# Patient Record
Sex: Female | Born: 1940 | Race: Black or African American | Hispanic: No | Marital: Single | Smoking: Former smoker
Health system: Southern US, Community
[De-identification: ages and names within clinical notes are randomized; demographics above are authoritative.]

## PROBLEM LIST (undated history)

## (undated) DIAGNOSIS — E079 Disorder of thyroid, unspecified: Secondary | ICD-10-CM

## (undated) DIAGNOSIS — R011 Cardiac murmur, unspecified: Secondary | ICD-10-CM

## (undated) DIAGNOSIS — E785 Hyperlipidemia, unspecified: Secondary | ICD-10-CM

## (undated) DIAGNOSIS — I10 Essential (primary) hypertension: Secondary | ICD-10-CM

## (undated) HISTORY — DX: Hyperlipidemia, unspecified: E78.5

## (undated) HISTORY — DX: Cardiac murmur, unspecified: R01.1

## (undated) HISTORY — DX: Essential (primary) hypertension: I10

## (undated) HISTORY — DX: Disorder of thyroid, unspecified: E07.9

## (undated) HISTORY — PX: TUBAL LIGATION: SHX77

---

## 1976-10-31 HISTORY — PX: APPENDECTOMY: SHX54

## 1976-10-31 HISTORY — PX: ABDOMINAL HYSTERECTOMY: SHX81

## 2004-11-04 ENCOUNTER — Ambulatory Visit: Payer: Self-pay | Admitting: Family Medicine

## 2004-11-17 ENCOUNTER — Ambulatory Visit: Payer: Self-pay | Admitting: Family Medicine

## 2005-05-31 ENCOUNTER — Ambulatory Visit: Payer: Self-pay | Admitting: Family Medicine

## 2006-01-03 ENCOUNTER — Ambulatory Visit: Payer: Self-pay | Admitting: Family Medicine

## 2007-01-09 ENCOUNTER — Ambulatory Visit: Payer: Self-pay | Admitting: Family Medicine

## 2007-12-24 ENCOUNTER — Ambulatory Visit: Payer: Self-pay | Admitting: Family Medicine

## 2008-01-31 ENCOUNTER — Ambulatory Visit: Payer: Self-pay | Admitting: Family Medicine

## 2008-03-20 ENCOUNTER — Ambulatory Visit: Payer: Self-pay | Admitting: Gastroenterology

## 2008-03-27 ENCOUNTER — Ambulatory Visit: Payer: Self-pay | Admitting: Family Medicine

## 2008-12-21 LAB — HM COLONOSCOPY

## 2009-02-17 ENCOUNTER — Ambulatory Visit: Payer: Self-pay | Admitting: Family Medicine

## 2010-03-29 ENCOUNTER — Ambulatory Visit: Payer: Self-pay | Admitting: Family Medicine

## 2011-04-08 ENCOUNTER — Ambulatory Visit: Payer: Self-pay | Admitting: Family Medicine

## 2012-05-29 ENCOUNTER — Ambulatory Visit: Payer: Self-pay | Admitting: Family Medicine

## 2012-05-30 ENCOUNTER — Ambulatory Visit: Payer: Self-pay | Admitting: Family Medicine

## 2012-12-10 ENCOUNTER — Ambulatory Visit: Payer: Self-pay | Admitting: Family Medicine

## 2012-12-17 LAB — HM MAMMOGRAPHY

## 2012-12-21 ENCOUNTER — Ambulatory Visit (INDEPENDENT_AMBULATORY_CARE_PROVIDER_SITE_OTHER): Payer: Medicare Other | Admitting: Internal Medicine

## 2012-12-21 ENCOUNTER — Encounter: Payer: Self-pay | Admitting: Internal Medicine

## 2012-12-21 VITALS — BP 140/84 | HR 77 | Temp 98.3°F | Ht 62.16 in | Wt 144.0 lb

## 2012-12-21 DIAGNOSIS — E079 Disorder of thyroid, unspecified: Secondary | ICD-10-CM

## 2012-12-21 DIAGNOSIS — L989 Disorder of the skin and subcutaneous tissue, unspecified: Secondary | ICD-10-CM

## 2012-12-21 DIAGNOSIS — I10 Essential (primary) hypertension: Secondary | ICD-10-CM

## 2012-12-30 ENCOUNTER — Encounter: Payer: Self-pay | Admitting: Internal Medicine

## 2012-12-30 NOTE — Assessment & Plan Note (Signed)
Blood pressure is elevated today.  She states it is usually well controlled.  Have her spot check her pressures.  Get her back in soon to reassess.  Same med regimen.  Check metabolic panel

## 2012-12-30 NOTE — Progress Notes (Signed)
Subjective:    Patient ID: Christina Cole, female    DOB: Dec 26, 1940, 72 y.o.   MRN: [id number]  HPI 72 year old female with past history of hypertension and hypercholesterolemia who comes in today to follow up on these issues as well as to establish care.  Also here for her physical exam.  Former pt of Dr Thana Ates.  States she feels good.  No chest pain or tightness with increased activity or exertion.  Breathing stable.  Has a left neck mole - present x 2 years.  No nausea or vomiting.  No bowel change.  Knees get a little stiff at times, but better when she is active.  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   Past Medical History  Diagnosis Date  . Hypertension   . Hyperlipidemia   . Thyroid disease   . Heart murmur     Outpatient Encounter Prescriptions as of 12/21/2012  Medication Sig Dispense Refill  . atorvastatin (LIPITOR) 40 MG tablet Take 40 mg by mouth daily.      Marland Kitchen diltiazem (CARDIZEM LA) 360 MG 24 hr tablet Take 360 mg by mouth daily.      Marland Kitchen EPINEPHrine (EPIPEN JR) 0.15 MG/0.3ML injection Inject 0.15 mg into the muscle as needed for anaphylaxis.      Marland Kitchen losartan-hydrochlorothiazide (HYZAAR) 100-25 MG per tablet Take 1 tablet by mouth daily.      . Polyvinyl Alcohol-Povidone (REFRESH OP) Apply to eye.      . potassium chloride SA (K-DUR,KLOR-CON) 20 MEQ tablet Take 20 mEq by mouth 2 (two) times daily.       No facility-administered encounter medications on file as of 12/21/2012.    Review of Systems Patient denies any headache, lightheadedness or dizziness.  No sinus or allergy symptoms.  No chest pain, tightness or palpitations.  No increased shortness of breath, cough or congestion.  No nausea or vomiting.  No acid reflux.  No abdominal pain or cramping.  No bowel change, such as diarrhea, constipation, BRBPR or melana.  No urine change.        Objective:   Physical Exam Filed Vitals:   12/21/12 1451  BP: 140/84  Pulse: 77  Temp: 98.3 F (31.13 C)   72 year old  female in no acute distress.   HEENT:  Nares- clear.  Oropharynx - without lesions. NECK:  Supple.  Nontender.  No audible bruit.  HEART:  Appears to be regular. LUNGS:  No crackles or wheezing audible.  Respirations even and unlabored.  RADIAL PULSE:  Equal bilaterally.    BREASTS:  No nipple discharge or nipple retraction present.  Could not appreciate any distinct nodules or axillary adenopathy.  ABDOMEN:  Soft, nontender.  Bowel sounds present and normal.  No audible abdominal bruit.  GU:  Normal external genitalia.  Vaginal vault without lesions.  S/p hysterectomy.  Could not appreciate any adnexal masses or tenderness.   RECTAL:  Heme negative.   EXTREMITIES:  No increased edema present.  DP pulses palpable and equal bilaterally.   SKIN:  Mole left neck.         Assessment & Plan:  NECK MOLE.  Will refer to dermatology for evaluation and removal.   MSK.  Knees stiff at times.  Better with activity.  Stretches.  Stay active.   ABNORMAL MAMMOGRAM.  I do not have results, but according to pt - she had her mammogram 7/13.  Recommended follow up views/ultrasound.  These were performed 1/14.  Due follow up bilateral in 7/14.    HEALTH MAINTENANCE.  Physical today.  She is s/p hysterectomy and does not require yearly pap smears.  Mammogram as outlined.  Obtain records.  Reports had colonoscopy 2010.

## 2012-12-30 NOTE — Assessment & Plan Note (Signed)
Unclear.  Need to obtain records.  Apparently has seen Dr Kerrie Pleasure.

## 2012-12-30 NOTE — Assessment & Plan Note (Signed)
Low cholesterol diet and exercise.  Continues on lipitor.  Check lipid panel and liver function.

## 2012-12-30 NOTE — Assessment & Plan Note (Signed)
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

## 2013-02-05 ENCOUNTER — Telehealth: Payer: Self-pay | Admitting: *Deleted

## 2013-02-05 ENCOUNTER — Encounter: Payer: Self-pay | Admitting: Internal Medicine

## 2013-02-05 ENCOUNTER — Ambulatory Visit (INDEPENDENT_AMBULATORY_CARE_PROVIDER_SITE_OTHER): Payer: Medicare Other | Admitting: Internal Medicine

## 2013-02-05 VITALS — BP 130/80 | HR 71 | Temp 98.5°F | Ht 62.16 in | Wt 144.2 lb

## 2013-02-05 DIAGNOSIS — E7800 Pure hypercholesterolemia, unspecified: Secondary | ICD-10-CM

## 2013-02-05 DIAGNOSIS — I10 Essential (primary) hypertension: Secondary | ICD-10-CM

## 2013-02-05 DIAGNOSIS — Z9109 Other allergy status, other than to drugs and biological substances: Secondary | ICD-10-CM

## 2013-02-05 DIAGNOSIS — E079 Disorder of thyroid, unspecified: Secondary | ICD-10-CM

## 2013-02-05 LAB — HEPATIC FUNCTION PANEL
Alkaline Phosphatase: 55 U/L (ref 39–117)
Bilirubin, Direct: 0.1 mg/dL (ref 0.0–0.3)
Total Bilirubin: 1.1 mg/dL (ref 0.3–1.2)

## 2013-02-05 LAB — BASIC METABOLIC PANEL
CO2: 31 mEq/L (ref 19–32)
Chloride: 101 mEq/L (ref 96–112)
Creatinine, Ser: 0.8 mg/dL (ref 0.4–1.2)
Glucose, Bld: 86 mg/dL (ref 70–99)
Sodium: 140 mEq/L (ref 135–145)

## 2013-02-05 LAB — CBC WITH DIFFERENTIAL/PLATELET
Basophils Absolute: 0 10*3/uL (ref 0.0–0.1)
Basophils Relative: 0.7 % (ref 0.0–3.0)
Eosinophils Absolute: 0.1 10*3/uL (ref 0.0–0.7)
Hemoglobin: 11.5 g/dL — ABNORMAL LOW (ref 12.0–15.0)
MCHC: 33.1 g/dL (ref 30.0–36.0)
MCV: 85 fl (ref 78.0–100.0)
Monocytes Absolute: 0.3 10*3/uL (ref 0.1–1.0)
Neutro Abs: 1.9 10*3/uL (ref 1.4–7.7)
RBC: 4.09 Mil/uL (ref 3.87–5.11)
RDW: 13.7 % (ref 11.5–14.6)

## 2013-02-05 LAB — LIPID PANEL
LDL Cholesterol: 83 mg/dL (ref 0–99)
Total CHOL/HDL Ratio: 3

## 2013-02-06 ENCOUNTER — Telehealth: Payer: Self-pay | Admitting: Internal Medicine

## 2013-02-06 DIAGNOSIS — D649 Anemia, unspecified: Secondary | ICD-10-CM

## 2013-02-06 NOTE — Telephone Encounter (Signed)
Pt notified of lab results and need for follow up lab in 2 weeks.  She wants to come in on 02/20/13 at 8:30 for labs.  Please put on lab appt.  Pt aware of appt time.

## 2013-02-07 NOTE — Telephone Encounter (Signed)
Appointment made

## 2013-02-10 ENCOUNTER — Encounter: Payer: Self-pay | Admitting: Internal Medicine

## 2013-02-10 NOTE — Assessment & Plan Note (Signed)
Unclear.  Need to obtain records.  Apparently has seen Dr Kerrie Pleasure.

## 2013-02-10 NOTE — Assessment & Plan Note (Signed)
Low cholesterol diet and exercise.  On lipitor. Check lipid panel and liver function.

## 2013-02-10 NOTE — Assessment & Plan Note (Signed)
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

## 2013-02-10 NOTE — Assessment & Plan Note (Signed)
Blood pressure better today.  Pressures as outlined on outside checks.   Same medication regimen.  Check metabolic panel

## 2013-02-10 NOTE — Progress Notes (Signed)
  Subjective:    Patient ID: Christina Cole, female    DOB: Mar 01, 1941, 72 y.o.   MRN: [id number]  HPI 72 year old female with past history of hypertension and hypercholesterolemia who comes in today for a scheduled follow up.   States she feels good.  No chest pain or tightness with increased activity or exertion.  Breathing stable.  Had a left neck mole - present x 2 years.  Saw dermatology.  Had removed.  No nausea or vomiting.  No bowel change.  Blood pressure recently averaging 128-142/70-84.  Overall she feels she is doing well.   Past Medical History  Diagnosis Date  . Hypertension   . Hyperlipidemia   . Thyroid disease   . Heart murmur     Outpatient Encounter Prescriptions as of 02/05/2013  Medication Sig Dispense Refill  . atorvastatin (LIPITOR) 40 MG tablet Take 40 mg by mouth daily.      Marland Kitchen diltiazem (CARDIZEM LA) 360 MG 24 hr tablet Take 360 mg by mouth daily.      Marland Kitchen EPINEPHrine (EPIPEN JR) 0.15 MG/0.3ML injection Inject 0.15 mg into the muscle as needed for anaphylaxis.      Marland Kitchen losartan-hydrochlorothiazide (HYZAAR) 100-25 MG per tablet Take 1 tablet by mouth daily.      . Polyvinyl Alcohol-Povidone (REFRESH OP) Apply to eye.      . potassium chloride SA (K-DUR,KLOR-CON) 20 MEQ tablet Take 20 mEq by mouth 2 (two) times daily.       No facility-administered encounter medications on file as of 02/05/2013.    Review of Systems Patient denies any headache, lightheadedness or dizziness.  No sinus or allergy symptoms.  No chest pain, tightness or palpitations.  No increased shortness of breath, cough or congestion.  No nausea or vomiting.  No acid reflux.  No abdominal pain or cramping.  No bowel change, such as diarrhea, constipation, BRBPR or melana.  No urine change.        Objective:   Physical Exam  Filed Vitals:   02/05/13 1133  BP: 130/80  Pulse: 71  Temp: 98.5 F (37.72 C)   72 year old female in no acute distress.   HEENT:  Nares- clear.  Oropharynx - without  lesions. NECK:  Supple.  Nontender.  No audible bruit.  HEART:  Appears to be regular. LUNGS:  No crackles or wheezing audible.  Respirations even and unlabored.  RADIAL PULSE:  Equal bilaterally.    ABDOMEN:  Soft, nontender.  Bowel sounds present and normal.  No audible abdominal bruit.    EXTREMITIES:  No increased edema present.  DP pulses palpable and equal bilaterally.   SKIN:  S/p excision of mole left neck.         Assessment & Plan:  NECK MOLE.  Saw dermatology.  Removed.   ABNORMAL MAMMOGRAM.  I do not have results, but according to pt - she had her mammogram 7/13.  Recommended follow up views/ultrasound.  These were performed 1/14.  Due follow up bilateral in 7/14.  Make sure scheduled.   HEALTH MAINTENANCE.  Physical 12/21/12.  .  She is s/p hysterectomy and does not require yearly pap smears.  Mammogram as outlined.  Obtain records.  Reports had colonoscopy 2010.

## 2013-02-18 ENCOUNTER — Other Ambulatory Visit: Payer: Self-pay | Admitting: Internal Medicine

## 2013-02-18 NOTE — Telephone Encounter (Signed)
Rx sent to pharmacy by escript  

## 2013-02-20 ENCOUNTER — Other Ambulatory Visit: Payer: Self-pay | Admitting: Internal Medicine

## 2013-02-20 ENCOUNTER — Other Ambulatory Visit (INDEPENDENT_AMBULATORY_CARE_PROVIDER_SITE_OTHER): Payer: Medicare Other

## 2013-02-20 DIAGNOSIS — D649 Anemia, unspecified: Secondary | ICD-10-CM

## 2013-02-20 LAB — CBC WITH DIFFERENTIAL/PLATELET
Eosinophils Absolute: 0.1 10*3/uL (ref 0.0–0.7)
Eosinophils Relative: 2.7 % (ref 0.0–5.0)
Lymphocytes Relative: 45.9 % (ref 12.0–46.0)
MCV: 84.6 fl (ref 78.0–100.0)
Monocytes Absolute: 0.3 10*3/uL (ref 0.1–1.0)
Neutrophils Relative %: 45.1 % (ref 43.0–77.0)
Platelets: 218 10*3/uL (ref 150.0–400.0)
RBC: 4 Mil/uL (ref 3.87–5.11)
WBC: 5.3 10*3/uL (ref 4.5–10.5)

## 2013-02-20 LAB — IBC PANEL
Iron: 56 ug/dL (ref 42–145)
Saturation Ratios: 17.8 % — ABNORMAL LOW (ref 20.0–50.0)

## 2013-02-20 LAB — VITAMIN B12: Vitamin B-12: 318 pg/mL (ref 211–911)

## 2013-02-20 LAB — FERRITIN: Ferritin: 100.6 ng/mL (ref 10.0–291.0)

## 2013-02-20 NOTE — Progress Notes (Signed)
Orders placed for follow up cbc

## 2013-02-26 NOTE — Telephone Encounter (Signed)
error 

## 2013-04-03 ENCOUNTER — Other Ambulatory Visit: Payer: Self-pay | Admitting: Internal Medicine

## 2013-04-23 ENCOUNTER — Other Ambulatory Visit (INDEPENDENT_AMBULATORY_CARE_PROVIDER_SITE_OTHER): Payer: Medicare Other

## 2013-04-23 DIAGNOSIS — D649 Anemia, unspecified: Secondary | ICD-10-CM

## 2013-04-23 LAB — CBC WITH DIFFERENTIAL/PLATELET
Basophils Absolute: 0 10*3/uL (ref 0.0–0.1)
Eosinophils Absolute: 0.2 10*3/uL (ref 0.0–0.7)
Lymphocytes Relative: 50.6 % — ABNORMAL HIGH (ref 12.0–46.0)
MCHC: 33.5 g/dL (ref 30.0–36.0)
MCV: 86.7 fl (ref 78.0–100.0)
Monocytes Absolute: 0.3 10*3/uL (ref 0.1–1.0)
Neutrophils Relative %: 38.3 % — ABNORMAL LOW (ref 43.0–77.0)
Platelets: 212 10*3/uL (ref 150.0–400.0)
RBC: 3.95 Mil/uL (ref 3.87–5.11)
RDW: 14.9 % — ABNORMAL HIGH (ref 11.5–14.6)

## 2013-04-25 ENCOUNTER — Other Ambulatory Visit: Payer: Self-pay | Admitting: Internal Medicine

## 2013-04-25 DIAGNOSIS — D649 Anemia, unspecified: Secondary | ICD-10-CM

## 2013-04-25 DIAGNOSIS — E7800 Pure hypercholesterolemia, unspecified: Secondary | ICD-10-CM

## 2013-04-25 DIAGNOSIS — I10 Essential (primary) hypertension: Secondary | ICD-10-CM

## 2013-04-25 NOTE — Progress Notes (Signed)
Order placed for f/u labs.  

## 2013-05-08 ENCOUNTER — Other Ambulatory Visit: Payer: Medicare Other

## 2013-05-08 ENCOUNTER — Encounter: Payer: Self-pay | Admitting: *Deleted

## 2013-05-08 LAB — FECAL OCCULT BLOOD, IMMUNOCHEMICAL: Fecal Occult Bld: NEGATIVE

## 2013-06-06 ENCOUNTER — Other Ambulatory Visit (INDEPENDENT_AMBULATORY_CARE_PROVIDER_SITE_OTHER): Payer: Medicare Other

## 2013-06-06 ENCOUNTER — Other Ambulatory Visit: Payer: Medicare Other

## 2013-06-06 DIAGNOSIS — E7800 Pure hypercholesterolemia, unspecified: Secondary | ICD-10-CM

## 2013-06-06 DIAGNOSIS — I10 Essential (primary) hypertension: Secondary | ICD-10-CM

## 2013-06-06 DIAGNOSIS — D649 Anemia, unspecified: Secondary | ICD-10-CM

## 2013-06-06 LAB — HEPATIC FUNCTION PANEL
AST: 17 U/L (ref 0–37)
Albumin: 3.8 g/dL (ref 3.5–5.2)
Alkaline Phosphatase: 56 U/L (ref 39–117)
Total Bilirubin: 1.1 mg/dL (ref 0.3–1.2)

## 2013-06-06 LAB — CBC WITH DIFFERENTIAL/PLATELET
Basophils Relative: 0.6 % (ref 0.0–3.0)
Eosinophils Absolute: 0.2 10*3/uL (ref 0.0–0.7)
Eosinophils Relative: 3.5 % (ref 0.0–5.0)
Lymphocytes Relative: 50.9 % — ABNORMAL HIGH (ref 12.0–46.0)
MCHC: 33.1 g/dL (ref 30.0–36.0)
MCV: 86.7 fl (ref 78.0–100.0)
Monocytes Absolute: 0.3 10*3/uL (ref 0.1–1.0)
Neutrophils Relative %: 38.5 % — ABNORMAL LOW (ref 43.0–77.0)
Platelets: 217 10*3/uL (ref 150.0–400.0)
RBC: 3.92 Mil/uL (ref 3.87–5.11)
WBC: 5 10*3/uL (ref 4.5–10.5)

## 2013-06-06 LAB — BASIC METABOLIC PANEL
Calcium: 9.4 mg/dL (ref 8.4–10.5)
GFR: 96.74 mL/min (ref >60.00)
Potassium: 3.2 mEq/L — ABNORMAL LOW (ref 3.5–5.1)
Sodium: 141 mEq/L (ref 135–145)

## 2013-06-06 LAB — LIPID PANEL
HDL: 46.4 mg/dL (ref >39.00)
LDL Cholesterol: 86 mg/dL (ref 0–99)
VLDL: 25.2 mg/dL (ref 0.0–40.0)

## 2013-06-10 ENCOUNTER — Ambulatory Visit (INDEPENDENT_AMBULATORY_CARE_PROVIDER_SITE_OTHER): Payer: Medicare Other | Admitting: Internal Medicine

## 2013-06-10 ENCOUNTER — Ambulatory Visit: Payer: Medicare Other | Admitting: Internal Medicine

## 2013-06-10 ENCOUNTER — Encounter: Payer: Self-pay | Admitting: Internal Medicine

## 2013-06-10 VITALS — BP 128/90 | HR 64 | Temp 98.1°F | Ht 62.16 in | Wt 150.0 lb

## 2013-06-10 DIAGNOSIS — Z9109 Other allergy status, other than to drugs and biological substances: Secondary | ICD-10-CM

## 2013-06-10 DIAGNOSIS — I10 Essential (primary) hypertension: Secondary | ICD-10-CM

## 2013-06-10 DIAGNOSIS — D649 Anemia, unspecified: Secondary | ICD-10-CM

## 2013-06-10 DIAGNOSIS — E7800 Pure hypercholesterolemia, unspecified: Secondary | ICD-10-CM

## 2013-06-10 DIAGNOSIS — E876 Hypokalemia: Secondary | ICD-10-CM

## 2013-06-10 DIAGNOSIS — E079 Disorder of thyroid, unspecified: Secondary | ICD-10-CM

## 2013-06-10 MED ORDER — POTASSIUM CHLORIDE CRYS ER 20 MEQ PO TBCR: 20.0000 meq | EXTENDED_RELEASE_TABLET | Freq: Two times a day (BID) | ORAL | Status: DC

## 2013-06-10 NOTE — Assessment & Plan Note (Addendum)
Followed by Dr Kerrie Pleasure.   Had thyroid ultrasound 3/14 - per record - multinodular thyroid.  Follow thyroid function.

## 2013-06-10 NOTE — Assessment & Plan Note (Addendum)
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  Used recently.  No adverse reactions.  Follow.

## 2013-06-10 NOTE — Assessment & Plan Note (Addendum)
Low cholesterol diet and exercise.  On lipitor.  Follow lipid panel and liver function.  Recent cholesterol panel revealed total cholesterol 158, triglycerides 126, HDL 46 and LDL 86.

## 2013-06-10 NOTE — Assessment & Plan Note (Addendum)
Blood pressure as outlined.  Same medication regimen.  Check metabolic panel.  Have her spot check her pressures.

## 2013-06-10 NOTE — Progress Notes (Signed)
  Subjective:    Patient ID: Christina Cole, female    DOB: 1941-09-13, 72 y.o.   MRN: [id number]  HPI 72 year old female with past history of hypertension and hypercholesterolemia who comes in today for a scheduled follow up.   States she feels good.  No chest pain or tightness with increased activity or exertion.  Breathing stable.  No nausea or vomiting.  No bowel change.  Overall she feels she is doing well.  States she was stung by a wasp x 2 05/15/13.  Is allergic.  Used an epi pen.  States did not have severe symptoms.  Dong well.  Overalls he feels good.     Past Medical History  Diagnosis Date  . Hypertension   . Hyperlipidemia   . Thyroid disease   . Heart murmur     Outpatient Encounter Prescriptions as of 06/10/2013  Medication Sig Dispense Refill  . atorvastatin (LIPITOR) 40 MG tablet TAKE 1 TABLET BY MOUTH ONCE A DAY  30 tablet  5  . EPINEPHrine (EPIPEN JR) 0.15 MG/0.3ML injection Inject 0.15 mg into the muscle as needed for anaphylaxis.      Marland Kitchen losartan-hydrochlorothiazide (HYZAAR) 100-25 MG per tablet Take 1 tablet by mouth daily.      Marland Kitchen MATZIM LA 360 MG 24 hr tablet TAKE 1 TABLET BY MOUTH EVERY DAY  30 tablet  5  . Polyvinyl Alcohol-Povidone (REFRESH OP) Apply to eye.      . potassium chloride SA (K-DUR,KLOR-CON) 20 MEQ tablet Take 20 mEq by mouth 2 (two) times daily.       No facility-administered encounter medications on file as of 06/10/2013.    Review of Systems Patient denies any headache, lightheadedness or dizziness.  No sinus or allergy symptoms.  No chest pain, tightness or palpitations.  No increased shortness of breath, cough or congestion.  No nausea or vomiting.  No acid reflux.  No abdominal pain or cramping.  No bowel change, such as diarrhea, constipation, BRBPR or melana.  No urine change.        Objective:   Physical Exam  Filed Vitals:   06/10/13 1019  BP: 128/90  Pulse: 64  Temp: 98.1 F (36.7 C)   Blood pressure recheck 134-136/78-80  72 year  old female in no acute distress.   HEENT:  Nares- clear.  Oropharynx - without lesions. NECK:  Supple.  Nontender.  No audible bruit.  HEART:  Appears to be regular. LUNGS:  No crackles or wheezing audible.  Respirations even and unlabored.  RADIAL PULSE:  Equal bilaterally.    ABDOMEN:  Soft, nontender.  Bowel sounds present and normal.  No audible abdominal bruit.    EXTREMITIES:  No increased edema present.  DP pulses palpable and equal bilaterally.         Assessment & Plan:  ABNORMAL MAMMOGRAM.  I do not have results, but according to pt - she had her mammogram 7/13.  Recommended follow up views/ultrasound.  These were performed 1/14.  Was due follow up bilateral in 7/14.  Need results  HEALTH MAINTENANCE.  Physical 12/21/12.  .  She is s/p hysterectomy and does not require yearly pap smears.  Mammogram as outlined.   Reports had colonoscopy 2010.  With the anemia, will refer back to confirm no further GI w/up warranted.

## 2013-06-12 ENCOUNTER — Telehealth: Payer: Self-pay | Admitting: Internal Medicine

## 2013-06-12 ENCOUNTER — Other Ambulatory Visit: Payer: Self-pay | Admitting: Internal Medicine

## 2013-06-12 ENCOUNTER — Encounter: Payer: Self-pay | Admitting: Internal Medicine

## 2013-06-12 DIAGNOSIS — Z1239 Encounter for other screening for malignant neoplasm of breast: Secondary | ICD-10-CM

## 2013-06-12 DIAGNOSIS — E876 Hypokalemia: Secondary | ICD-10-CM | POA: Insufficient documentation

## 2013-06-12 DIAGNOSIS — D649 Anemia, unspecified: Secondary | ICD-10-CM | POA: Insufficient documentation

## 2013-06-12 NOTE — Assessment & Plan Note (Signed)
On potassium supplement.  Potassium decreased.  States has been taking regularly.  Information given on foods with increased potassium.  Follow.  Recheck soon to confirm normalized.

## 2013-06-12 NOTE — Progress Notes (Signed)
Order placed for bilateral mammo

## 2013-06-12 NOTE — Telephone Encounter (Signed)
Order placed fo f/u bilateral mammogram.

## 2013-06-12 NOTE — Assessment & Plan Note (Signed)
Hemoglobin 11.3.  Ferritin ok.  Will check B12.  Given persistent decrease and given last scope 2010, will refer to Dr Servando Snare to confirm if further GI w/up warranted.

## 2013-06-17 ENCOUNTER — Other Ambulatory Visit: Payer: Self-pay | Admitting: Urgent Care

## 2013-06-17 LAB — CBC WITH DIFFERENTIAL/PLATELET
Basophil #: 0 10*3/uL (ref 0.0–0.1)
Basophil %: 0.9 %
Eosinophil #: 0.1 10*3/uL (ref 0.0–0.7)
Eosinophil %: 2.5 %
HCT: 35.5 % (ref 35.0–47.0)
Lymphocyte #: 2.3 10*3/uL (ref 1.0–3.6)
Lymphocyte %: 43.2 %
MCH: 28.7 pg (ref 26.0–34.0)
MCV: 84 fL (ref 80–100)
Monocyte %: 8.6 %
Neutrophil %: 44.8 %
RBC: 4.24 10*6/uL (ref 3.80–5.20)

## 2013-06-17 LAB — FERRITIN: Ferritin (ARMC): 101 ng/mL (ref 8–388)

## 2013-06-17 LAB — IRON: Iron: 61 ug/dL (ref 50–170)

## 2013-07-03 ENCOUNTER — Encounter: Payer: Self-pay | Admitting: Emergency Medicine

## 2013-07-09 ENCOUNTER — Ambulatory Visit: Payer: Self-pay | Admitting: Internal Medicine

## 2013-08-18 ENCOUNTER — Other Ambulatory Visit: Payer: Self-pay | Admitting: Internal Medicine

## 2013-09-13 ENCOUNTER — Encounter: Payer: Self-pay | Admitting: Internal Medicine

## 2013-09-18 ENCOUNTER — Ambulatory Visit (INDEPENDENT_AMBULATORY_CARE_PROVIDER_SITE_OTHER): Payer: Medicare Other | Admitting: Internal Medicine

## 2013-09-18 ENCOUNTER — Encounter: Payer: Self-pay | Admitting: Internal Medicine

## 2013-09-18 VITALS — BP 130/90 | HR 62 | Temp 98.2°F | Ht 62.16 in | Wt 143.8 lb

## 2013-09-18 DIAGNOSIS — D649 Anemia, unspecified: Secondary | ICD-10-CM

## 2013-09-18 DIAGNOSIS — E7800 Pure hypercholesterolemia, unspecified: Secondary | ICD-10-CM

## 2013-09-18 DIAGNOSIS — E079 Disorder of thyroid, unspecified: Secondary | ICD-10-CM

## 2013-09-18 DIAGNOSIS — Z9109 Other allergy status, other than to drugs and biological substances: Secondary | ICD-10-CM

## 2013-09-18 DIAGNOSIS — E876 Hypokalemia: Secondary | ICD-10-CM

## 2013-09-18 DIAGNOSIS — I10 Essential (primary) hypertension: Secondary | ICD-10-CM

## 2013-09-18 MED ORDER — LOSARTAN POTASSIUM-HCTZ 100-25 MG PO TABS: 1.0000 | ORAL_TABLET | Freq: Every day | ORAL | Status: DC

## 2013-09-18 NOTE — Progress Notes (Signed)
  Subjective:    Patient ID: Christina Cole, female    DOB: 1941/09/27, 72 y.o.   MRN: [id number]  HPI 72 year old female with past history of hypertension and hypercholesterolemia who comes in today for a scheduled follow up.   States she feels good.  No chest pain or tightness with increased activity or exertion.  Breathing stable.  No nausea or vomiting.  No bowel change.  Overall she feels she is doing well.   Overalls she feels good.     Past Medical History  Diagnosis Date  . Hypertension   . Hyperlipidemia   . Thyroid disease   . Heart murmur     Outpatient Encounter Prescriptions as of 09/18/2013  Medication Sig  . atorvastatin (LIPITOR) 40 MG tablet TAKE 1 TABLET BY MOUTH ONCE A DAY  . EPINEPHrine (EPIPEN JR) 0.15 MG/0.3ML injection Inject 0.15 mg into the muscle as needed for anaphylaxis.  Marland Kitchen losartan-hydrochlorothiazide (HYZAAR) 100-25 MG per tablet Take 1 tablet by mouth daily.  Marland Kitchen MATZIM LA 360 MG 24 hr tablet TAKE 1 TABLET BY MOUTH EVERY DAY  . Polyvinyl Alcohol-Povidone (REFRESH OP) Apply to eye.  . potassium chloride SA (K-DUR,KLOR-CON) 20 MEQ tablet Take 1 tablet (20 mEq total) by mouth 2 (two) times daily.    Review of Systems Patient denies any headache, lightheadedness or dizziness.  No sinus or allergy symptoms.  No chest pain, tightness or palpitations.  No increased shortness of breath, cough or congestion.  No nausea or vomiting.  No acid reflux.  No abdominal pain or cramping.  No bowel change, such as diarrhea, constipation, BRBPR or melana.  No urine change.        Objective:   Physical Exam  Filed Vitals:   09/18/13 1051  BP: 130/90  Pulse: 62  Temp: 98.2 F (36.8 C)   Blood pressure recheck 134-32/61-37  72 year old female in no acute distress.   HEENT:  Nares- clear.  Oropharynx - without lesions. NECK:  Supple.  Nontender.  No audible bruit.  HEART:  Appears to be regular. LUNGS:  No crackles or wheezing audible.  Respirations even and unlabored.   RADIAL PULSE:  Equal bilaterally.    ABDOMEN:  Soft, nontender.  Bowel sounds present and normal.  No audible abdominal bruit.    EXTREMITIES:  No increased edema present.  DP pulses palpable and equal bilaterally.         Assessment & Plan:  ABNORMAL MAMMOGRAM.  I do not have results, but according to pt - she had her mammogram 7/13.  Recommended follow up views/ultrasound.  These were performed 1/14.  Had mammogram (bilateral ) 07/09/13 - negative.   HEALTH MAINTENANCE.  Physical 12/21/12.  .  She is s/p hysterectomy and does not require yearly pap smears.  Mammogram as outlined.   Reports had colonoscopy 2010.   Mammogram 07/09/13 - negative.

## 2013-09-18 NOTE — Progress Notes (Signed)
Pre-visit discussion using our clinic review tool. No additional management support is needed unless otherwise documented below in the visit note.  

## 2013-09-22 ENCOUNTER — Encounter: Payer: Self-pay | Admitting: Internal Medicine

## 2013-09-22 NOTE — Assessment & Plan Note (Signed)
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  Used recently.  No adverse reactions.  Follow.

## 2013-09-22 NOTE — Assessment & Plan Note (Signed)
On potassium supplements.  Follow potassium.   

## 2013-09-22 NOTE — Assessment & Plan Note (Signed)
Blood pressure as outlined.  Same medication regimen.  Check metabolic panel.  Have her spot check her pressures.

## 2013-09-22 NOTE — Assessment & Plan Note (Signed)
Low cholesterol diet and exercise.  On lipitor.  Follow lipid panel and liver function.  Last cholesterol panel revealed total cholesterol 158, triglycerides 126, HDL 46 and LDL 86.

## 2013-09-22 NOTE — Assessment & Plan Note (Signed)
Hemoglobin 11.3.  Ferritin ok.  Will check B12.  Given persistent decrease and given last scope 2010, was seen at Dr Annabell Sabal office.  Felt no further w/up warranted.

## 2013-09-22 NOTE — Assessment & Plan Note (Signed)
Followed by Dr Kerrie Pleasure.   Had thyroid ultrasound 3/14 - per record - multinodular thyroid.  Follow thyroid function.

## 2013-09-23 ENCOUNTER — Other Ambulatory Visit (INDEPENDENT_AMBULATORY_CARE_PROVIDER_SITE_OTHER): Payer: Medicare Other

## 2013-09-23 DIAGNOSIS — D649 Anemia, unspecified: Secondary | ICD-10-CM

## 2013-09-23 DIAGNOSIS — E7800 Pure hypercholesterolemia, unspecified: Secondary | ICD-10-CM

## 2013-09-23 DIAGNOSIS — I10 Essential (primary) hypertension: Secondary | ICD-10-CM

## 2013-09-23 DIAGNOSIS — E876 Hypokalemia: Secondary | ICD-10-CM

## 2013-09-23 LAB — HEPATIC FUNCTION PANEL
ALT: 13 U/L (ref 0–35)
Albumin: 4 g/dL (ref 3.5–5.2)
Bilirubin, Direct: 0.1 mg/dL (ref 0.0–0.3)
Total Protein: 7.6 g/dL (ref 6.0–8.3)

## 2013-09-23 LAB — BASIC METABOLIC PANEL
BUN: 11 mg/dL (ref 6–23)
CO2: 30 mEq/L (ref 19–32)
Chloride: 103 mEq/L (ref 96–112)
GFR: 86.11 mL/min (ref >60.00)
Potassium: 3.7 mEq/L (ref 3.5–5.1)
Sodium: 140 mEq/L (ref 135–145)

## 2013-09-23 LAB — CBC WITH DIFFERENTIAL/PLATELET
Basophils Relative: 0.3 % (ref 0.0–3.0)
Eosinophils Absolute: 0.1 10*3/uL (ref 0.0–0.7)
HCT: 34.6 % — ABNORMAL LOW (ref 36.0–46.0)
Hemoglobin: 11.5 g/dL — ABNORMAL LOW (ref 12.0–15.0)
Lymphocytes Relative: 43.9 % (ref 12.0–46.0)
Lymphs Abs: 2 10*3/uL (ref 0.7–4.0)
MCHC: 33.3 g/dL (ref 30.0–36.0)
Monocytes Absolute: 0.3 10*3/uL (ref 0.1–1.0)
Neutrophils Relative %: 46 % (ref 43.0–77.0)
Platelets: 217 10*3/uL (ref 150.0–400.0)
RBC: 4.07 Mil/uL (ref 3.87–5.11)
WBC: 4.6 10*3/uL (ref 4.5–10.5)

## 2013-09-23 LAB — FERRITIN: Ferritin: 88.8 ng/mL (ref 10.0–291.0)

## 2013-09-23 LAB — LIPID PANEL
Cholesterol: 156 mg/dL (ref 0–200)
HDL: 52 mg/dL (ref >39.00)
LDL Cholesterol: 85 mg/dL (ref 0–99)
Triglycerides: 97 mg/dL (ref 0.0–149.0)
VLDL: 19.4 mg/dL (ref 0.0–40.0)

## 2013-09-23 LAB — IBC PANEL
Iron: 53 ug/dL (ref 42–145)
Transferrin: 253.5 mg/dL (ref 212.0–360.0)

## 2013-09-24 ENCOUNTER — Encounter: Payer: Self-pay | Admitting: *Deleted

## 2013-09-28 ENCOUNTER — Other Ambulatory Visit: Payer: Self-pay | Admitting: Internal Medicine

## 2013-11-12 ENCOUNTER — Other Ambulatory Visit: Payer: Self-pay | Admitting: Internal Medicine

## 2013-11-18 ENCOUNTER — Encounter: Payer: Self-pay | Admitting: *Deleted

## 2013-12-05 ENCOUNTER — Telehealth: Payer: Self-pay | Admitting: *Deleted

## 2013-12-05 MED ORDER — DILTIAZEM HCL ER COATED BEADS 360 MG PO CP24: 360.0000 mg | ORAL_CAPSULE | Freq: Every day | ORAL | Status: DC

## 2013-12-05 NOTE — Telephone Encounter (Signed)
Pt.notified

## 2013-12-05 NOTE — Telephone Encounter (Signed)
Pt called & reported that she mailed a note also to let you know that her insurance is not covering the Matzim LA but will cover the Cardizem LA. Needs new Rx sent to [facility]

## 2013-12-05 NOTE — Telephone Encounter (Signed)
I sent in rx for cardizem CD 360 to replace her matzim LA 360.  Let me know if I need to do anything else.

## 2013-12-25 ENCOUNTER — Encounter: Payer: Medicare Other | Admitting: Internal Medicine

## 2014-01-01 ENCOUNTER — Telehealth: Payer: Self-pay | Admitting: Internal Medicine

## 2014-01-01 MED ORDER — DILTIAZEM HCL ER 360 MG PO CP24: ORAL_CAPSULE | ORAL | Status: DC

## 2014-01-01 NOTE — Telephone Encounter (Signed)
Insurance required change to diltiazem ER 360mg .  rx written.

## 2014-01-02 NOTE — Telephone Encounter (Signed)
Mailed Rx to patient & pt notified

## 2014-02-04 ENCOUNTER — Telehealth: Payer: Self-pay | Admitting: Emergency Medicine

## 2014-02-04 NOTE — Telephone Encounter (Signed)
Patient does not need referral to Dr. Ronnald Collum per [employer]- they are [facility]

## 2014-02-16 ENCOUNTER — Other Ambulatory Visit: Payer: Self-pay | Admitting: Internal Medicine

## 2014-02-27 ENCOUNTER — Ambulatory Visit (INDEPENDENT_AMBULATORY_CARE_PROVIDER_SITE_OTHER): Payer: Commercial Managed Care - HMO | Admitting: Internal Medicine

## 2014-02-27 ENCOUNTER — Encounter: Payer: Self-pay | Admitting: Internal Medicine

## 2014-02-27 VITALS — BP 140/70 | HR 75 | Temp 98.4°F | Ht 62.5 in | Wt 145.5 lb

## 2014-02-27 DIAGNOSIS — Z9109 Other allergy status, other than to drugs and biological substances: Secondary | ICD-10-CM

## 2014-02-27 DIAGNOSIS — E876 Hypokalemia: Secondary | ICD-10-CM

## 2014-02-27 DIAGNOSIS — D649 Anemia, unspecified: Secondary | ICD-10-CM

## 2014-02-27 DIAGNOSIS — I10 Essential (primary) hypertension: Secondary | ICD-10-CM

## 2014-02-27 DIAGNOSIS — E079 Disorder of thyroid, unspecified: Secondary | ICD-10-CM

## 2014-02-27 DIAGNOSIS — E7800 Pure hypercholesterolemia, unspecified: Secondary | ICD-10-CM

## 2014-02-27 NOTE — Progress Notes (Signed)
Pre visit review using our clinic review tool, if applicable. No additional management support is needed unless otherwise documented below in the visit note. 

## 2014-02-27 NOTE — Progress Notes (Signed)
  Subjective:    Patient ID: Christina Cole, female    DOB: 12-Apr-1941, 73 y.o.   MRN: [id number]  HPI 73 year old female with past history of hypertension and hypercholesterolemia who comes in today to follow up on these issues as well as for a complete physical exam.   States she feels good.  No chest pain or tightness with increased activity or exertion.  Breathing stable.  No nausea or vomiting.  No bowel change.  Overall she feels she is doing well.   Feels good.  Blood pressure averaging 135-142/60-70s.     Past Medical History  Diagnosis Date  . Hypertension   . Hyperlipidemia   . Thyroid disease   . Heart murmur     Outpatient Encounter Prescriptions as of 02/27/2014  Medication Sig  . atorvastatin (LIPITOR) 40 MG tablet TAKE 1 TABLET BY MOUTH ONCE A DAY  . Diltiazem HCl ER 360 MG CP24 Take one po q day  . EPINEPHrine (EPIPEN JR) 0.15 MG/0.3ML injection Inject 0.15 mg into the muscle as needed for anaphylaxis.  Marland Kitchen KLOR-CON M20 20 MEQ tablet TAKE 1 TABLET BY MOUTH TWICE A DAY  . losartan-hydrochlorothiazide (HYZAAR) 100-25 MG per tablet Take 1 tablet by mouth daily.  . Polyvinyl Alcohol-Povidone (REFRESH OP) Apply to eye.    Review of Systems Patient denies any headache, lightheadedness or dizziness.  No sinus or allergy symptoms.  No chest pain, tightness or palpitations.  No increased shortness of breath, cough or congestion.  No nausea or vomiting.  No acid reflux.  No abdominal pain or cramping.  No bowel change, such as diarrhea, constipation, BRBPR or melana.  No urine change.        Objective:   Physical Exam  Filed Vitals:   02/27/14 1444  BP: 140/70  Pulse: 75  Temp: 98.4 F (36.9 C)   Blood pressure recheck 59/55-49  73 year old female in no acute distress.   HEENT:  Nares- clear.  Oropharynx - without lesions. NECK:  Supple.  Nontender.  No audible bruit.  HEART:  Appears to be regular. LUNGS:  No crackles or wheezing audible.  Respirations even and  unlabored.  RADIAL PULSE:  Equal bilaterally.    BREASTS:  No nipple discharge or nipple retraction present.  Could not appreciate any distinct nodules or axillary adenopathy.  ABDOMEN:  Soft, nontender.  Bowel sounds present and normal.  No audible abdominal bruit.  GU:  Not performed.    EXTREMITIES:  No increased edema present.  DP pulses palpable and equal bilaterally.          Assessment & Plan:  ABNORMAL MAMMOGRAM.  I do not have results, but according to pt - she had her mammogram 7/13.  Recommended follow up views/ultrasound.  These were performed 1/14.  Had mammogram (bilateral ) 07/09/13 - negative.   HEALTH MAINTENANCE.  Physical today.   She is s/p hysterectomy and does not require yearly pap smears.  Reports had colonoscopy 2010.   Mammogram 07/09/13 - negative.

## 2014-03-02 ENCOUNTER — Encounter: Payer: Self-pay | Admitting: Internal Medicine

## 2014-03-02 NOTE — Assessment & Plan Note (Signed)
Low cholesterol diet and exercise.  On lipitor.  Follow lipid panel and liver function.   

## 2014-03-02 NOTE — Assessment & Plan Note (Signed)
Followed by Dr Carmela Rima.   Had follow up thyroid ultrasound recently.  States everything checked out fine.  Follow thyroid function.

## 2014-03-02 NOTE — Assessment & Plan Note (Signed)
On potassium supplements.  Follow potassium.   

## 2014-03-02 NOTE — Assessment & Plan Note (Signed)
Blood pressure as outlined.  Same medication regimen.  Follow metabolic panel.  Continue to spot check her pressures.

## 2014-03-02 NOTE — Assessment & Plan Note (Signed)
Follow cbc and ferritin.  Given persistent decrease and given last scope 2010, was seen at Dr Wohl's office.  Felt no further w/up warranted.    

## 2014-03-02 NOTE — Assessment & Plan Note (Signed)
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  Follow.

## 2014-03-10 ENCOUNTER — Other Ambulatory Visit (INDEPENDENT_AMBULATORY_CARE_PROVIDER_SITE_OTHER): Payer: Commercial Managed Care - HMO

## 2014-03-10 DIAGNOSIS — D649 Anemia, unspecified: Secondary | ICD-10-CM

## 2014-03-10 DIAGNOSIS — I10 Essential (primary) hypertension: Secondary | ICD-10-CM

## 2014-03-10 DIAGNOSIS — E7800 Pure hypercholesterolemia, unspecified: Secondary | ICD-10-CM

## 2014-03-10 LAB — BASIC METABOLIC PANEL
BUN: 11 mg/dL (ref 6–23)
CHLORIDE: 101 meq/L (ref 96–112)
CO2: 30 meq/L (ref 19–32)
CREATININE: 0.7 mg/dL (ref 0.4–1.2)
Calcium: 9.6 mg/dL (ref 8.4–10.5)
GFR: 101.12 mL/min (ref >60.00)
Glucose, Bld: 97 mg/dL (ref 70–99)
Potassium: 3.2 mEq/L — ABNORMAL LOW (ref 3.5–5.1)
Sodium: 139 mEq/L (ref 135–145)

## 2014-03-10 LAB — HEPATIC FUNCTION PANEL
ALBUMIN: 4 g/dL (ref 3.5–5.2)
ALK PHOS: 54 U/L (ref 39–117)
ALT: 15 U/L (ref 0–35)
AST: 17 U/L (ref 0–37)
Bilirubin, Direct: 0.1 mg/dL (ref 0.0–0.3)
Total Bilirubin: 1 mg/dL (ref 0.2–1.2)
Total Protein: 7.4 g/dL (ref 6.0–8.3)

## 2014-03-10 LAB — CBC WITH DIFFERENTIAL/PLATELET
Basophils Absolute: 0 10*3/uL (ref 0.0–0.1)
Basophils Relative: 0.8 % (ref 0.0–3.0)
EOS PCT: 2.9 % (ref 0.0–5.0)
Eosinophils Absolute: 0.2 10*3/uL (ref 0.0–0.7)
HCT: 35.2 % — ABNORMAL LOW (ref 36.0–46.0)
Hemoglobin: 11.7 g/dL — ABNORMAL LOW (ref 12.0–15.0)
LYMPHS ABS: 2.4 10*3/uL (ref 0.7–4.0)
Lymphocytes Relative: 44.8 % (ref 12.0–46.0)
MCHC: 33.2 g/dL (ref 30.0–36.0)
MCV: 85.9 fl (ref 78.0–100.0)
MONO ABS: 0.4 10*3/uL (ref 0.1–1.0)
Monocytes Relative: 6.7 % (ref 3.0–12.0)
Neutro Abs: 2.4 10*3/uL (ref 1.4–7.7)
Neutrophils Relative %: 44.8 % (ref 43.0–77.0)
Platelets: 210 10*3/uL (ref 150.0–400.0)
RBC: 4.09 Mil/uL (ref 3.87–5.11)
RDW: 14.3 % (ref 11.5–15.5)
WBC: 5.4 10*3/uL (ref 4.0–10.5)

## 2014-03-10 LAB — LIPID PANEL
CHOL/HDL RATIO: 3
Cholesterol: 178 mg/dL (ref 0–200)
HDL: 52.2 mg/dL (ref >39.00)
LDL Cholesterol: 100 mg/dL — ABNORMAL HIGH (ref 0–99)
Triglycerides: 130 mg/dL (ref 0.0–149.0)
VLDL: 26 mg/dL (ref 0.0–40.0)

## 2014-03-10 LAB — FERRITIN: FERRITIN: 92.3 ng/mL (ref 10.0–291.0)

## 2014-03-10 LAB — VITAMIN B12: Vitamin B-12: 303 pg/mL (ref 211–911)

## 2014-03-11 ENCOUNTER — Encounter: Payer: Self-pay | Admitting: Internal Medicine

## 2014-03-11 ENCOUNTER — Other Ambulatory Visit: Payer: Self-pay | Admitting: Internal Medicine

## 2014-03-11 DIAGNOSIS — E876 Hypokalemia: Secondary | ICD-10-CM

## 2014-03-11 NOTE — Progress Notes (Signed)
Order placed for f/u potassium.  

## 2014-03-19 ENCOUNTER — Other Ambulatory Visit: Payer: Self-pay | Admitting: Internal Medicine

## 2014-03-20 ENCOUNTER — Other Ambulatory Visit (INDEPENDENT_AMBULATORY_CARE_PROVIDER_SITE_OTHER): Payer: Commercial Managed Care - HMO

## 2014-03-20 DIAGNOSIS — E876 Hypokalemia: Secondary | ICD-10-CM

## 2014-03-20 LAB — POTASSIUM: POTASSIUM: 3.1 meq/L — AB (ref 3.5–5.1)

## 2014-03-26 ENCOUNTER — Encounter: Payer: Self-pay | Admitting: Internal Medicine

## 2014-03-26 ENCOUNTER — Ambulatory Visit (INDEPENDENT_AMBULATORY_CARE_PROVIDER_SITE_OTHER): Payer: Commercial Managed Care - HMO | Admitting: Internal Medicine

## 2014-03-26 VITALS — BP 122/62 | HR 84 | Temp 98.4°F | Ht 62.5 in | Wt 144.5 lb

## 2014-03-26 DIAGNOSIS — I10 Essential (primary) hypertension: Secondary | ICD-10-CM

## 2014-03-26 DIAGNOSIS — E876 Hypokalemia: Secondary | ICD-10-CM

## 2014-03-26 MED ORDER — TRIAMTERENE-HCTZ 37.5-25 MG PO TABS: 1.0000 | ORAL_TABLET | Freq: Every day | ORAL | Status: DC

## 2014-03-26 MED ORDER — LOSARTAN POTASSIUM 100 MG PO TABS: 100.0000 mg | ORAL_TABLET | Freq: Every day | ORAL | Status: DC

## 2014-03-26 MED ORDER — POTASSIUM CHLORIDE CRYS ER 20 MEQ PO TBCR: 20.0000 meq | EXTENDED_RELEASE_TABLET | Freq: Two times a day (BID) | ORAL | Status: DC

## 2014-03-26 NOTE — Patient Instructions (Signed)
Stop the Losartan/HCTZ 100/25.  Start Losartan 100mg  - one per day and Triam/HCTZ 37.5/25 - oner per day.  Recheck potassium at the end of next week.

## 2014-03-26 NOTE — Progress Notes (Signed)
Pre visit review using our clinic review tool, if applicable. No additional management support is needed unless otherwise documented below in the visit note. 

## 2014-03-30 ENCOUNTER — Encounter: Payer: Self-pay | Admitting: Internal Medicine

## 2014-03-30 NOTE — Assessment & Plan Note (Signed)
Blood pressure doing well on current regimen.  Having problems with low potassium despite potassium supplements.  Will change Losartan/HCTZ 100/25 to Losartan 100mg  q day and triam/hctz 37.5/25 q day.  Will decrease the potassium back down to bid dosing temporarily.  Recheck potassium soon.  If normal, then hopefully can decrease the potassium to q day.  Follow.

## 2014-03-30 NOTE — Assessment & Plan Note (Signed)
See above.  On potassium supplements.  Change losartan/hctz 100/25 to losartan 100mg  q day and triam/hctz 37.5/25 q day.  Decrease potassium supplements down to bid dosing.  Recheck potassium soon.  Hopefully can decrease more.

## 2014-03-30 NOTE — Progress Notes (Signed)
  Subjective:    Patient ID: Christina Cole, female    DOB: May 29, 1941, 73 y.o.   MRN: [id number]  HPI 73 year old female with past history of hypertension and hypercholesterolemia who comes in today for a scheduled follow up.  Here today to discuss her blood pressure medication and low potassium.  Recently has been found to have a persistent low potassium level despite potassium supplements bid.  She is on Losartan/HCTZ.  We discussed changing this medication.   States she feels good.  No chest pain or tightness with increased activity or exertion.  Breathing stable.  No nausea or vomiting.  No bowel change.  Overall she feels she is doing well.   Feels good.  Blood pressure doing well.    Past Medical History  Diagnosis Date  . Hypertension   . Hyperlipidemia   . Thyroid disease   . Heart murmur     Outpatient Encounter Prescriptions as of 03/26/2014  Medication Sig  . atorvastatin (LIPITOR) 40 MG tablet TAKE 1 TABLET BY MOUTH ONCE A DAY  . Diltiazem HCl ER 360 MG CP24 Take one po q day  . EPINEPHrine (EPIPEN JR) 0.15 MG/0.3ML injection Inject 0.15 mg into the muscle as needed for anaphylaxis.  . Polyvinyl Alcohol-Povidone (REFRESH OP) Apply to eye.  . potassium chloride SA (KLOR-CON M20) 20 MEQ tablet Take 1 tablet (20 mEq total) by mouth 2 (two) times daily.  . [DISCONTINUED] KLOR-CON M20 20 MEQ tablet TAKE 1 TABLET BY MOUTH TWICE A DAY  . [DISCONTINUED] losartan-hydrochlorothiazide (HYZAAR) 100-25 MG per tablet TAKE 1 TABLET BY MOUTH EVERY DAY  . [DISCONTINUED] potassium chloride SA (KLOR-CON M20) 20 MEQ tablet TAKE 2 TABLETS EVERY MORNING & 1 TABLET EVERY AFTERNOON  . losartan (COZAAR) 100 MG tablet Take 1 tablet (100 mg total) by mouth daily.  Marland Kitchen triamterene-hydrochlorothiazide (MAXZIDE-25) 37.5-25 MG per tablet Take 1 tablet by mouth daily.    Review of Systems Patient denies any headache, lightheadedness or dizziness.  No sinus or allergy symptoms.  No chest pain, tightness or  palpitations.  No increased shortness of breath, cough or congestion.  No nausea or vomiting.  No acid reflux.  No abdominal pain or cramping.  No bowel change, such as diarrhea, constipation, BRBPR or melana.  No urine change.        Objective:   Physical Exam  Filed Vitals:   03/26/14 1215  BP: 122/62  Pulse: 84  Temp: 98.4 F (77.31 C)   73 year old female in no acute distress. NECK:  Supple.  Nontender.   HEART:  Appears to be regular. LUNGS:  No crackles or wheezing audible.  Respirations even and unlabored.         Assessment & Plan:  ABNORMAL MAMMOGRAM.  I do not have results, but according to pt - she had her mammogram 7/13.  Recommended follow up views/ultrasound.  These were performed 1/14.  Had mammogram (bilateral ) 07/09/13 - negative.   HEALTH MAINTENANCE.  Physical last visit.   She is s/p hysterectomy and does not require yearly pap smears.  Reports had colonoscopy 2010.   Mammogram 07/09/13 - negative.

## 2014-04-02 ENCOUNTER — Other Ambulatory Visit: Payer: Self-pay | Admitting: Internal Medicine

## 2014-04-02 ENCOUNTER — Other Ambulatory Visit (INDEPENDENT_AMBULATORY_CARE_PROVIDER_SITE_OTHER): Payer: Commercial Managed Care - HMO

## 2014-04-02 ENCOUNTER — Other Ambulatory Visit: Payer: Self-pay | Admitting: *Deleted

## 2014-04-02 DIAGNOSIS — N289 Disorder of kidney and ureter, unspecified: Secondary | ICD-10-CM

## 2014-04-02 DIAGNOSIS — E876 Hypokalemia: Secondary | ICD-10-CM

## 2014-04-02 LAB — BASIC METABOLIC PANEL
BUN: 25 mg/dL — AB (ref 6–23)
CALCIUM: 9.5 mg/dL (ref 8.4–10.5)
CO2: 28 mEq/L (ref 19–32)
Chloride: 105 mEq/L (ref 96–112)
Creatinine, Ser: 1.8 mg/dL — ABNORMAL HIGH (ref 0.4–1.2)
GFR: 35.01 mL/min — AB (ref >60.00)
GLUCOSE: 156 mg/dL — AB (ref 70–99)
Potassium: 3.3 mEq/L — ABNORMAL LOW (ref 3.5–5.1)
SODIUM: 142 meq/L (ref 135–145)

## 2014-04-02 NOTE — Progress Notes (Signed)
Order placed for f/u labs.  

## 2014-04-03 ENCOUNTER — Other Ambulatory Visit (INDEPENDENT_AMBULATORY_CARE_PROVIDER_SITE_OTHER): Payer: Commercial Managed Care - HMO

## 2014-04-03 DIAGNOSIS — N289 Disorder of kidney and ureter, unspecified: Secondary | ICD-10-CM

## 2014-04-03 LAB — BASIC METABOLIC PANEL
BUN: 23 mg/dL (ref 6–23)
CALCIUM: 9.4 mg/dL (ref 8.4–10.5)
CO2: 26 meq/L (ref 19–32)
CREATININE: 1.5 mg/dL — AB (ref 0.4–1.2)
Chloride: 104 mEq/L (ref 96–112)
GFR: 43.37 mL/min — ABNORMAL LOW (ref >60.00)
Glucose, Bld: 92 mg/dL (ref 70–99)
Potassium: 3.7 mEq/L (ref 3.5–5.1)
SODIUM: 139 meq/L (ref 135–145)

## 2014-04-11 ENCOUNTER — Ambulatory Visit (INDEPENDENT_AMBULATORY_CARE_PROVIDER_SITE_OTHER): Payer: Commercial Managed Care - HMO | Admitting: Internal Medicine

## 2014-04-11 ENCOUNTER — Encounter: Payer: Self-pay | Admitting: Internal Medicine

## 2014-04-11 VITALS — BP 130/70 | HR 81 | Temp 98.0°F | Ht 62.5 in | Wt 145.8 lb

## 2014-04-11 DIAGNOSIS — E876 Hypokalemia: Secondary | ICD-10-CM

## 2014-04-11 DIAGNOSIS — I10 Essential (primary) hypertension: Secondary | ICD-10-CM

## 2014-04-11 DIAGNOSIS — N289 Disorder of kidney and ureter, unspecified: Secondary | ICD-10-CM

## 2014-04-11 NOTE — Progress Notes (Signed)
Pre visit review using our clinic review tool, if applicable. No additional management support is needed unless otherwise documented below in the visit note. 

## 2014-04-11 NOTE — Patient Instructions (Signed)
Remain off the triam/HCTZ and potassium.  Monitor your blood pressure.

## 2014-04-15 ENCOUNTER — Encounter: Payer: Self-pay | Admitting: Internal Medicine

## 2014-04-15 ENCOUNTER — Other Ambulatory Visit (INDEPENDENT_AMBULATORY_CARE_PROVIDER_SITE_OTHER): Payer: Commercial Managed Care - HMO

## 2014-04-15 ENCOUNTER — Other Ambulatory Visit: Payer: Self-pay | Admitting: Internal Medicine

## 2014-04-15 DIAGNOSIS — N183 Chronic kidney disease, stage 3 unspecified: Secondary | ICD-10-CM | POA: Insufficient documentation

## 2014-04-15 DIAGNOSIS — E871 Hypo-osmolality and hyponatremia: Secondary | ICD-10-CM

## 2014-04-15 DIAGNOSIS — N289 Disorder of kidney and ureter, unspecified: Secondary | ICD-10-CM

## 2014-04-15 LAB — BASIC METABOLIC PANEL
BUN: 18 mg/dL (ref 6–23)
CO2: 25 mEq/L (ref 19–32)
Calcium: 9.4 mg/dL (ref 8.4–10.5)
Chloride: 105 mEq/L (ref 96–112)
Creatinine, Ser: 1.3 mg/dL — ABNORMAL HIGH (ref 0.4–1.2)
GFR: 51.48 mL/min — AB (ref >60.00)
Glucose, Bld: 133 mg/dL — ABNORMAL HIGH (ref 70–99)
POTASSIUM: 2.9 meq/L — AB (ref 3.5–5.1)
SODIUM: 139 meq/L (ref 135–145)

## 2014-04-15 NOTE — Assessment & Plan Note (Signed)
Blood pressure as outlined.  On my check today, blood pressure 134-136/72.  Will continue off diuretic for now.  Stop potassium since off diuretic and since last potassium wnl.  Follow pressures.  Recheck kidney function and electrolytes soon.  Follow.

## 2014-04-15 NOTE — Progress Notes (Signed)
  Subjective:    Patient ID: Christina Cole, female    DOB: July 11, 1941, 73 y.o.   MRN: [id number]  HPI 73 year old female with past history of hypertension and hypercholesterolemia who comes in today for a scheduled follow up.  Here today to discuss her blood pressure medication and increased Cr.  Recently had been found to have a persistent low potassium level despite potassium supplements bid.  She was previously on Losartan/HCTZ.  Last visit, this was changed to Losartan 100mg  q day and triam/hctz 37.5/25 q day.  Her Cr increased after the change.  She was asked to stop the triam/hctz and monitor her blood pressure.    States she feels good.  No chest pain or tightness with increased activity or exertion.  Breathing stable.  No nausea or vomiting.  No bowel change.  Overall she feels she is doing well.   Feels good.  Was able to check her blood pressure on two occasions since stopping the triam/hctz.  Blood pressure 120/64 and 142/64.  Last potassium check was wnl.  She is hold her potassium supplements now, since off diuretic.     Past Medical History  Diagnosis Date  . Hypertension   . Hyperlipidemia   . Thyroid disease   . Heart murmur     Outpatient Encounter Prescriptions as of 04/11/2014  Medication Sig  . atorvastatin (LIPITOR) 40 MG tablet TAKE 1 TABLET BY MOUTH ONCE A DAY  . Diltiazem HCl ER 360 MG CP24 Take one po q day  . EPINEPHrine (EPIPEN JR) 0.15 MG/0.3ML injection Inject 0.15 mg into the muscle as needed for anaphylaxis.  Marland Kitchen losartan (COZAAR) 100 MG tablet Take 1 tablet (100 mg total) by mouth daily.  . Polyvinyl Alcohol-Povidone (REFRESH OP) Apply to eye.  . potassium chloride SA (KLOR-CON M20) 20 MEQ tablet Take 1 tablet (20 mEq total) by mouth 2 (two) times daily.    Review of Systems Patient denies any headache, lightheadedness or dizziness.  No sinus or allergy symptoms.  No chest pain, tightness or palpitations.  No increased shortness of breath, cough or congestion.  No  nausea or vomiting.  No acid reflux.  No abdominal pain or cramping.  No bowel change, such as diarrhea, constipation, BRBPR or melana.  No urine change.  Blood pressure as outlined.        Objective:   Physical Exam  Filed Vitals:   04/11/14 0956  BP: 130/70  Pulse: 81  Temp: 98 F (68.25 C)   74 year old female in no acute distress.  NECK:  Supple.  Nontender.  No audible bruit.  HEART:  Appears to be regular. LUNGS:  No crackles or wheezing audible.  Respirations even and unlabored.  RADIAL PULSE:  Equal bilaterally.  ABDOMEN:  Soft, nontender.  Bowel sounds present and normal.  No audible abdominal bruit.    EXTREMITIES:  No increased edema present.  DP pulses palpable and equal bilaterally.          Assessment & Plan:  HEALTH MAINTENANCE.  Physical 02/27/14.   She is s/p hysterectomy and does not require yearly pap smears.  Reports had colonoscopy 2010.   Mammogram 07/09/13 - negative.

## 2014-04-15 NOTE — Progress Notes (Signed)
Quick Note:  Notify pt kidney function improving. Potassium low. Restart kcl 36meq bid x 2 days and then one po q day. Recheck potassium and Cr on Monday 04/21/14. ______

## 2014-04-15 NOTE — Assessment & Plan Note (Signed)
Remain off diuretic for now.  Remain off potassium supplements since normal last check.  Stay hydrated.  Follow.  Recheck metabolic panel soon.

## 2014-04-15 NOTE — Assessment & Plan Note (Signed)
Last potassium check wnl.  Remain off potassium for now.  Recheck potassium soon.  Follow.

## 2014-04-15 NOTE — Progress Notes (Signed)
Order placed for f/u labs.  

## 2014-04-19 ENCOUNTER — Other Ambulatory Visit: Payer: Self-pay | Admitting: Internal Medicine

## 2014-04-21 ENCOUNTER — Other Ambulatory Visit (INDEPENDENT_AMBULATORY_CARE_PROVIDER_SITE_OTHER): Payer: Commercial Managed Care - HMO

## 2014-04-21 DIAGNOSIS — E871 Hypo-osmolality and hyponatremia: Secondary | ICD-10-CM

## 2014-04-21 DIAGNOSIS — N289 Disorder of kidney and ureter, unspecified: Secondary | ICD-10-CM

## 2014-04-21 LAB — BASIC METABOLIC PANEL
BUN: 10 mg/dL (ref 6–23)
CHLORIDE: 105 meq/L (ref 96–112)
CO2: 27 meq/L (ref 19–32)
Calcium: 9.4 mg/dL (ref 8.4–10.5)
Creatinine, Ser: 0.9 mg/dL (ref 0.4–1.2)
GFR: 80.42 mL/min (ref >60.00)
GLUCOSE: 139 mg/dL — AB (ref 70–99)
POTASSIUM: 3.4 meq/L — AB (ref 3.5–5.1)
SODIUM: 141 meq/L (ref 135–145)

## 2014-04-22 ENCOUNTER — Encounter: Payer: Self-pay | Admitting: *Deleted

## 2014-04-22 ENCOUNTER — Other Ambulatory Visit: Payer: Self-pay | Admitting: Internal Medicine

## 2014-04-22 DIAGNOSIS — E876 Hypokalemia: Secondary | ICD-10-CM

## 2014-04-22 NOTE — Progress Notes (Signed)
Order placed for f/u potassium.  

## 2014-05-06 ENCOUNTER — Other Ambulatory Visit (INDEPENDENT_AMBULATORY_CARE_PROVIDER_SITE_OTHER): Payer: Commercial Managed Care - HMO

## 2014-05-06 ENCOUNTER — Other Ambulatory Visit: Payer: Self-pay | Admitting: Internal Medicine

## 2014-05-06 DIAGNOSIS — E876 Hypokalemia: Secondary | ICD-10-CM

## 2014-05-06 LAB — POTASSIUM: Potassium: 3.9 mEq/L (ref 3.5–5.1)

## 2014-05-07 ENCOUNTER — Other Ambulatory Visit: Payer: Self-pay | Admitting: Internal Medicine

## 2014-05-07 ENCOUNTER — Encounter: Payer: Self-pay | Admitting: *Deleted

## 2014-05-07 DIAGNOSIS — E876 Hypokalemia: Secondary | ICD-10-CM

## 2014-05-07 NOTE — Progress Notes (Signed)
Order placed for f/u lab.   

## 2014-05-28 ENCOUNTER — Other Ambulatory Visit (INDEPENDENT_AMBULATORY_CARE_PROVIDER_SITE_OTHER): Payer: Commercial Managed Care - HMO

## 2014-05-28 DIAGNOSIS — E876 Hypokalemia: Secondary | ICD-10-CM

## 2014-05-28 LAB — BASIC METABOLIC PANEL
BUN: 13 mg/dL (ref 6–23)
CHLORIDE: 104 meq/L (ref 96–112)
CO2: 26 mEq/L (ref 19–32)
Calcium: 9.3 mg/dL (ref 8.4–10.5)
Creatinine, Ser: 0.9 mg/dL (ref 0.4–1.2)
GFR: 79.37 mL/min (ref >60.00)
Glucose, Bld: 95 mg/dL (ref 70–99)
Potassium: 3.3 mEq/L — ABNORMAL LOW (ref 3.5–5.1)
Sodium: 140 mEq/L (ref 135–145)

## 2014-05-30 ENCOUNTER — Encounter: Payer: Self-pay | Admitting: *Deleted

## 2014-06-16 ENCOUNTER — Other Ambulatory Visit: Payer: Self-pay | Admitting: Internal Medicine

## 2014-07-02 ENCOUNTER — Encounter: Payer: Self-pay | Admitting: Internal Medicine

## 2014-07-02 ENCOUNTER — Other Ambulatory Visit: Payer: Self-pay | Admitting: Internal Medicine

## 2014-07-02 ENCOUNTER — Ambulatory Visit (INDEPENDENT_AMBULATORY_CARE_PROVIDER_SITE_OTHER): Payer: Commercial Managed Care - HMO | Admitting: Internal Medicine

## 2014-07-02 VITALS — BP 124/82 | HR 76 | Temp 97.9°F | Ht 62.5 in | Wt 148.0 lb

## 2014-07-02 DIAGNOSIS — I10 Essential (primary) hypertension: Secondary | ICD-10-CM

## 2014-07-02 DIAGNOSIS — E876 Hypokalemia: Secondary | ICD-10-CM

## 2014-07-02 DIAGNOSIS — N289 Disorder of kidney and ureter, unspecified: Secondary | ICD-10-CM

## 2014-07-02 DIAGNOSIS — D649 Anemia, unspecified: Secondary | ICD-10-CM

## 2014-07-02 DIAGNOSIS — E7800 Pure hypercholesterolemia, unspecified: Secondary | ICD-10-CM

## 2014-07-02 DIAGNOSIS — E079 Disorder of thyroid, unspecified: Secondary | ICD-10-CM

## 2014-07-02 LAB — CBC WITH DIFFERENTIAL/PLATELET
BASOS PCT: 0.8 % (ref 0.0–3.0)
Basophils Absolute: 0 10*3/uL (ref 0.0–0.1)
Eosinophils Absolute: 0.1 10*3/uL (ref 0.0–0.7)
Eosinophils Relative: 3.2 % (ref 0.0–5.0)
HCT: 34 % — ABNORMAL LOW (ref 36.0–46.0)
Hemoglobin: 11.4 g/dL — ABNORMAL LOW (ref 12.0–15.0)
Lymphocytes Relative: 48.6 % — ABNORMAL HIGH (ref 12.0–46.0)
Lymphs Abs: 2.1 10*3/uL (ref 0.7–4.0)
MCHC: 33.5 g/dL (ref 30.0–36.0)
MCV: 85.4 fl (ref 78.0–100.0)
MONO ABS: 0.4 10*3/uL (ref 0.1–1.0)
Monocytes Relative: 8.8 % (ref 3.0–12.0)
NEUTROS PCT: 38.6 % — AB (ref 43.0–77.0)
Neutro Abs: 1.7 10*3/uL (ref 1.4–7.7)
Platelets: 196 10*3/uL (ref 150.0–400.0)
RBC: 3.98 Mil/uL (ref 3.87–5.11)
RDW: 14.9 % (ref 11.5–15.5)
WBC: 4.4 10*3/uL (ref 4.0–10.5)

## 2014-07-02 LAB — LIPID PANEL
Cholesterol: 156 mg/dL (ref 0–200)
HDL: 45.1 mg/dL (ref >39.00)
LDL CALC: 94 mg/dL (ref 0–99)
NonHDL: 110.9
TRIGLYCERIDES: 87 mg/dL (ref 0.0–149.0)
Total CHOL/HDL Ratio: 3
VLDL: 17.4 mg/dL (ref 0.0–40.0)

## 2014-07-02 LAB — BASIC METABOLIC PANEL
BUN: 14 mg/dL (ref 6–23)
CO2: 29 mEq/L (ref 19–32)
Calcium: 9.1 mg/dL (ref 8.4–10.5)
Chloride: 103 mEq/L (ref 96–112)
Creatinine, Ser: 0.8 mg/dL (ref 0.4–1.2)
GFR: 85.92 mL/min (ref >60.00)
Glucose, Bld: 98 mg/dL (ref 70–99)
Potassium: 3.5 mEq/L (ref 3.5–5.1)
SODIUM: 138 meq/L (ref 135–145)

## 2014-07-02 LAB — HEPATIC FUNCTION PANEL
ALT: 17 U/L (ref 0–35)
AST: 18 U/L (ref 0–37)
Albumin: 3.9 g/dL (ref 3.5–5.2)
Alkaline Phosphatase: 63 U/L (ref 39–117)
Bilirubin, Direct: 0.1 mg/dL (ref 0.0–0.3)
TOTAL PROTEIN: 7.4 g/dL (ref 6.0–8.3)
Total Bilirubin: 0.9 mg/dL (ref 0.2–1.2)

## 2014-07-02 LAB — TSH: TSH: 1.77 u[IU]/mL (ref 0.35–4.50)

## 2014-07-02 MED ORDER — SPIRONOLACTONE 25 MG PO TABS: 25.0000 mg | ORAL_TABLET | Freq: Every day | ORAL | Status: DC

## 2014-07-02 NOTE — Progress Notes (Signed)
  Subjective:    Patient ID: Christina Cole, female    DOB: 11/07/1940, 73 y.o.   MRN: [id number]  HPI 73 year old female with past history of hypertension and hypercholesterolemia who comes in today for a scheduled follow up.   Recently had been found to have a persistent low potassium level despite potassium supplements bid.  She was previously on Losartan/HCTZ.  Previous visit, this was changed to Losartan 100mg  q day and triam/hctz 37.5/25 q day.  Her Cr increased after the change.  She was asked to stop the triam/hctz and monitor her blood pressure.    States she feels good.  No chest pain or tightness with increased activity or exertion.  Breathing stable.  No nausea or vomiting.  No bowel change.  Overall she feels she is doing well.   Feels good.  Was able to check her blood pressure on few occasions since stopping the triam/hctz.  Blood pressure elevated (155/80 and 166/80).      Past Medical History  Diagnosis Date  . Hypertension   . Hyperlipidemia   . Thyroid disease   . Heart murmur     Outpatient Encounter Prescriptions as of 07/02/2014  Medication Sig  . atorvastatin (LIPITOR) 40 MG tablet TAKE 1 TABLET BY MOUTH ONCE A DAY  . diltiazem (TIAZAC) 360 MG 24 hr capsule TAKE ONE CAPSULE BY MOUTH EVERY DAY  . EPINEPHrine (EPIPEN JR) 0.15 MG/0.3ML injection Inject 0.15 mg into the muscle as needed for anaphylaxis.  Marland Kitchen losartan (COZAAR) 100 MG tablet Take 1 tablet (100 mg total) by mouth daily.  . Polyvinyl Alcohol-Povidone (REFRESH OP) Apply to eye.  . potassium chloride SA (KLOR-CON M20) 20 MEQ tablet Take 1 tablet (20 mEq total) by mouth 2 (two) times daily.    Review of Systems Patient denies any headache, lightheadedness or dizziness.  No sinus or allergy symptoms.  No chest pain, tightness or palpitations.  No increased shortness of breath, cough or congestion.  No nausea or vomiting.  No acid reflux.  No abdominal pain or cramping.  No bowel change, such as diarrhea, constipation,  BRBPR or melana.  No urine change.  Blood pressure as outlined.        Objective:   Physical Exam  Filed Vitals:   07/02/14 1006  BP: 124/82  Pulse: 76  Temp: 97.9 F (36.6 C)   Blood pressure recheck:  158-160/80, pulse 92  74 year old female in no acute distress.   HEENT:  Nares- clear.  Oropharynx - without lesions. NECK:  Supple.  Nontender.  No audible bruit.  HEART:  Appears to be regular. LUNGS:  No crackles or wheezing audible.  Respirations even and unlabored.  RADIAL PULSE:  Equal bilaterally.  ABDOMEN:  Soft, nontender.  Bowel sounds present and normal.  No audible abdominal bruit. EXTREMITIES:  No increased edema present.  DP pulses palpable and equal bilaterally.          Assessment & Plan:  HEALTH MAINTENANCE.  Physical 02/27/14.   She is s/p hysterectomy and does not require yearly pap smears.  Reports had colonoscopy 2010.   Mammogram 07/09/13 - negative.  Schedule f/u mammogram.

## 2014-07-02 NOTE — Progress Notes (Signed)
Order placed for f/u lab.   

## 2014-07-02 NOTE — Progress Notes (Signed)
Pre visit review using our clinic review tool, if applicable. No additional management support is needed unless otherwise documented below in the visit note. 

## 2014-07-03 ENCOUNTER — Encounter: Payer: Self-pay | Admitting: *Deleted

## 2014-07-06 ENCOUNTER — Encounter: Payer: Self-pay | Admitting: Internal Medicine

## 2014-07-06 NOTE — Assessment & Plan Note (Signed)
Last cr wnl.  Start spironolactone.  Follow metabolic panel.

## 2014-07-06 NOTE — Assessment & Plan Note (Signed)
Taking one potassium supplement per day.  Will check potassium today.  Start spironolactone as outlined.  May be able to stop spironolactone.

## 2014-07-06 NOTE — Assessment & Plan Note (Signed)
Low cholesterol diet and exercise.  On lipitor.  Follow lipid panel and liver function.   

## 2014-07-06 NOTE — Assessment & Plan Note (Signed)
Follow cbc and ferritin.  Given persistent decrease and given last scope 2010, was seen at Dr Dorothey Baseman office.  Felt no further w/up warranted.

## 2014-07-06 NOTE — Assessment & Plan Note (Signed)
Blood pressure as outlined.  Blood pressure elevated.   On Losartan and tiazac now.  Needs something more for pressure.  Hypokalemia with hctz.  Did not tolerate triam/hctz.  Will start spironolactone 25 mg q day.   Follow pressures.  Recheck kidney function and electrolytes soon.  Follow.

## 2014-07-14 ENCOUNTER — Other Ambulatory Visit (INDEPENDENT_AMBULATORY_CARE_PROVIDER_SITE_OTHER): Payer: Commercial Managed Care - HMO

## 2014-07-14 DIAGNOSIS — E876 Hypokalemia: Secondary | ICD-10-CM

## 2014-07-14 DIAGNOSIS — Z1239 Encounter for other screening for malignant neoplasm of breast: Secondary | ICD-10-CM

## 2014-07-14 DIAGNOSIS — I10 Essential (primary) hypertension: Secondary | ICD-10-CM

## 2014-07-14 LAB — BASIC METABOLIC PANEL
BUN: 13 mg/dL (ref 6–23)
CO2: 26 mEq/L (ref 19–32)
CREATININE: 0.9 mg/dL (ref 0.4–1.2)
Calcium: 9.4 mg/dL (ref 8.4–10.5)
Chloride: 103 mEq/L (ref 96–112)
GFR: 82.5 mL/min (ref >60.00)
Glucose, Bld: 94 mg/dL (ref 70–99)
Potassium: 3.8 mEq/L (ref 3.5–5.1)
Sodium: 137 mEq/L (ref 135–145)

## 2014-07-24 ENCOUNTER — Other Ambulatory Visit: Payer: Self-pay | Admitting: Internal Medicine

## 2014-08-05 ENCOUNTER — Encounter: Payer: Self-pay | Admitting: Internal Medicine

## 2014-08-05 ENCOUNTER — Ambulatory Visit (INDEPENDENT_AMBULATORY_CARE_PROVIDER_SITE_OTHER): Payer: Commercial Managed Care - HMO | Admitting: Internal Medicine

## 2014-08-05 VITALS — BP 138/80 | HR 72 | Temp 97.9°F | Ht 62.5 in | Wt 147.5 lb

## 2014-08-05 DIAGNOSIS — I10 Essential (primary) hypertension: Secondary | ICD-10-CM

## 2014-08-05 DIAGNOSIS — Z1239 Encounter for other screening for malignant neoplasm of breast: Secondary | ICD-10-CM

## 2014-08-05 DIAGNOSIS — E7800 Pure hypercholesterolemia, unspecified: Secondary | ICD-10-CM

## 2014-08-05 DIAGNOSIS — N289 Disorder of kidney and ureter, unspecified: Secondary | ICD-10-CM

## 2014-08-05 DIAGNOSIS — E876 Hypokalemia: Secondary | ICD-10-CM

## 2014-08-05 MED ORDER — SPIRONOLACTONE 50 MG PO TABS: 50.0000 mg | ORAL_TABLET | Freq: Every day | ORAL | Status: DC

## 2014-08-05 NOTE — Progress Notes (Signed)
Pre visit review using our clinic review tool, if applicable. No additional management support is needed unless otherwise documented below in the visit note. 

## 2014-08-10 ENCOUNTER — Encounter: Payer: Self-pay | Admitting: Internal Medicine

## 2014-08-10 NOTE — Assessment & Plan Note (Signed)
Last cr wnl.  Increase spironolactone as outlined.  Follow metabolic panel.

## 2014-08-10 NOTE — Progress Notes (Signed)
Subjective:    Patient ID: Christina Cole, female    DOB: [birth year], 73 y.o.   MRN: [id number]  HPI 73 year old female with past history of hypertension and hypercholesterolemia who comes in today for a scheduled follow up.   Recently had been found to have a persistent low potassium level despite potassium supplements bid.  She was previously on Losartan/HCTZ.  Previous visit, this was changed to Losartan 100mg  q day and triam/hctz 37.5/25 q day.  Her Cr increased after the change.  She was asked to stop the triam/hctz and monitor her blood pressure.    Blood pressure remained elevated.  She was started on spironolactone last visit.  States she feels good.  No chest pain or tightness with increased activity or exertion.  Breathing stable.  No nausea or vomiting.  No bowel change.  Overall she feels she is doing well.   Feels good.  Was able to check her blood pressure on few occasions. Still seems to be elevated.  Tolerating the spironolactone.  On no potassium supplements.     Past Medical History  Diagnosis Date  . Hypertension   . Hyperlipidemia   . Thyroid disease   . Heart murmur     Outpatient Encounter Prescriptions as of 08/05/2014  Medication Sig  . atorvastatin (LIPITOR) 40 MG tablet TAKE 1 TABLET BY MOUTH ONCE A DAY  . diltiazem (TIAZAC) 360 MG 24 hr capsule TAKE ONE CAPSULE BY MOUTH EVERY DAY  . EPINEPHrine (EPIPEN JR) 0.15 MG/0.3ML injection Inject 0.15 mg into the muscle as needed for anaphylaxis.  Marland Kitchen losartan (COZAAR) 100 MG tablet TAKE 1 TABLET BY MOUTH ONCE A DAY  . Polyvinyl Alcohol-Povidone (REFRESH OP) Apply to eye.  . [DISCONTINUED] spironolactone (ALDACTONE) 25 MG tablet Take 1 tablet (25 mg total) by mouth daily.  Marland Kitchen spironolactone (ALDACTONE) 50 MG tablet Take 1 tablet (50 mg total) by mouth daily.  . [DISCONTINUED] potassium chloride SA (KLOR-CON M20) 20 MEQ tablet Take 1 tablet (20 mEq total) by mouth 2 (two) times daily.    Review of Systems Patient denies any  headache, lightheadedness or dizziness.  No sinus or allergy symptoms.  No chest pain, tightness or palpitations.  No increased shortness of breath, cough or congestion.  No nausea or vomiting.  No acid reflux.  No abdominal pain or cramping.  No bowel change, such as diarrhea, constipation, BRBPR or melana.  No urine change.  Blood pressure still elevated.  Previous sore groin.  Better now.  Pain resolved.        Objective:   Physical Exam  Filed Vitals:   08/05/14 0904  BP: 138/80  Pulse: 72  Temp: 97.9 F (36.6 C)   Blood pressure recheck:  156/78, pulse 5  73 year old female in no acute distress.   HEENT:  Nares- clear.  Oropharynx - without lesions. NECK:  Supple.  Nontender.  No audible bruit.  HEART:  Appears to be regular. LUNGS:  No crackles or wheezing audible.  Respirations even and unlabored.  RADIAL PULSE:  Equal bilaterally.  ABDOMEN:  Soft, nontender.  Bowel sounds present and normal.  No audible abdominal bruit. EXTREMITIES:  No increased edema present.  DP pulses palpable and equal bilaterally.          Assessment & Plan:  HEALTH MAINTENANCE.  Physical 02/27/14.   She is s/p hysterectomy and does not require yearly pap smears.  Reports had colonoscopy 2010.   Mammogram 07/09/13 - negative.  Schedule f/u  mammogram.

## 2014-08-10 NOTE — Assessment & Plan Note (Signed)
Blood pressure still elevated.   On Losartan, tiazac and spironolactone now.  Will increase to 50mg  q day (spironolactone).   Follow pressures.  Recheck kidney function and electrolytes soon.  Last check wnl.  Follow.

## 2014-08-10 NOTE — Assessment & Plan Note (Signed)
Off potassium now.  On spironolactone.  Follow.

## 2014-08-10 NOTE — Assessment & Plan Note (Signed)
Low cholesterol diet and exercise.  On lipitor.  Follow lipid panel and liver function.   

## 2014-08-19 ENCOUNTER — Other Ambulatory Visit (INDEPENDENT_AMBULATORY_CARE_PROVIDER_SITE_OTHER): Payer: Commercial Managed Care - HMO

## 2014-08-19 DIAGNOSIS — I10 Essential (primary) hypertension: Secondary | ICD-10-CM

## 2014-08-19 LAB — BASIC METABOLIC PANEL
BUN: 13 mg/dL (ref 6–23)
CALCIUM: 9.5 mg/dL (ref 8.4–10.5)
CO2: 20 mEq/L (ref 19–32)
Chloride: 105 mEq/L (ref 96–112)
Creatinine, Ser: 1.1 mg/dL (ref 0.4–1.2)
GFR: 66.39 mL/min (ref >60.00)
GLUCOSE: 95 mg/dL (ref 70–99)
Potassium: 4.5 mEq/L (ref 3.5–5.1)
Sodium: 138 mEq/L (ref 135–145)

## 2014-08-20 ENCOUNTER — Encounter: Payer: Self-pay | Admitting: *Deleted

## 2014-08-31 ENCOUNTER — Other Ambulatory Visit: Payer: Self-pay | Admitting: Internal Medicine

## 2014-09-02 ENCOUNTER — Ambulatory Visit: Payer: Self-pay | Admitting: Internal Medicine

## 2014-09-02 LAB — HM MAMMOGRAPHY: HM Mammogram: NEGATIVE

## 2014-09-03 ENCOUNTER — Encounter: Payer: Self-pay | Admitting: *Deleted

## 2014-09-05 ENCOUNTER — Ambulatory Visit (INDEPENDENT_AMBULATORY_CARE_PROVIDER_SITE_OTHER): Payer: Commercial Managed Care - HMO | Admitting: Internal Medicine

## 2014-09-05 ENCOUNTER — Encounter: Payer: Self-pay | Admitting: Internal Medicine

## 2014-09-05 VITALS — BP 110/60 | HR 74 | Temp 97.8°F | Ht 62.5 in | Wt 147.5 lb

## 2014-09-05 DIAGNOSIS — N289 Disorder of kidney and ureter, unspecified: Secondary | ICD-10-CM

## 2014-09-05 DIAGNOSIS — I10 Essential (primary) hypertension: Secondary | ICD-10-CM

## 2014-09-05 DIAGNOSIS — E876 Hypokalemia: Secondary | ICD-10-CM

## 2014-09-05 LAB — BASIC METABOLIC PANEL
BUN: 15 mg/dL (ref 6–23)
CALCIUM: 9.5 mg/dL (ref 8.4–10.5)
CO2: 22 mEq/L (ref 19–32)
Chloride: 108 mEq/L (ref 96–112)
Creatinine, Ser: 1.1 mg/dL (ref 0.4–1.2)
GFR: 66.38 mL/min (ref >60.00)
Glucose, Bld: 89 mg/dL (ref 70–99)
POTASSIUM: 4 meq/L (ref 3.5–5.1)
SODIUM: 140 meq/L (ref 135–145)

## 2014-09-05 NOTE — Progress Notes (Signed)
  Subjective:    Patient ID: Christina Cole, female    DOB: 06-27-1941, 73 y.o.   MRN: [id number]  HPI 72 year old female with past history of hypertension and hypercholesterolemia who comes in today for a scheduled follow up.   Previously had been found to have a persistent low potassium level despite potassium supplements bid.  She was previously on Losartan/HCTZ.  Previous visit, this was changed to Losartan 100mg  q day and triam/hctz 37.5/25 q day.  Her Cr increased after the change.  She was asked to stop the triam/hctz and monitor her blood pressure.    Blood pressure remained elevated.  She was started on spironolactone and this was titrated up last visit.  States she feels good.  No chest pain or tightness with increased activity or exertion.  Breathing stable.  No nausea or vomiting.  No bowel change.  Overall she feels she is doing well.  Off potassium supplements.      Past Medical History  Diagnosis Date  . Hypertension   . Hyperlipidemia   . Thyroid disease   . Heart murmur     Outpatient Encounter Prescriptions as of 09/05/2014  Medication Sig  . atorvastatin (LIPITOR) 40 MG tablet TAKE 1 TABLET BY MOUTH ONCE A DAY  . diltiazem (TIAZAC) 360 MG 24 hr capsule TAKE ONE CAPSULE BY MOUTH EVERY DAY  . EPINEPHrine (EPIPEN JR) 0.15 MG/0.3ML injection Inject 0.15 mg into the muscle as needed for anaphylaxis.  Marland Kitchen losartan (COZAAR) 100 MG tablet TAKE 1 TABLET BY MOUTH ONCE A DAY  . Polyvinyl Alcohol-Povidone (REFRESH OP) Apply to eye.  . spironolactone (ALDACTONE) 50 MG tablet Take 1 tablet (50 mg total) by mouth daily.    Review of Systems Patient denies any headache, lightheadedness or dizziness.  No sinus or allergy symptoms.  No chest pain, tightness or significant palpitations.  No increased shortness of breath, cough or congestion.  No nausea or vomiting.  No acid reflux.  No abdominal pain or cramping.  No bowel change, such as diarrhea, constipation, BRBPR or melana.  No urine  change.       Objective:   Physical Exam  Filed Vitals:   09/05/14 0926  BP: 110/60  Pulse: 74  Temp: 97.8 F (36.6 C)   Blood pressure recheck:  67/21  73 year old female in no acute distress.  NECK:  Supple.  Nontender.  No audible bruit.  HEART:  Appears to be regular.  I/VI systolic murmur.  LUNGS:  No crackles or wheezing audible.  Respirations even and unlabored.  RADIAL PULSE:  Equal bilaterally.  ABDOMEN:  Soft, nontender.  Bowel sounds present and normal.  No audible abdominal bruit. EXTREMITIES:  No increased edema present.  DP pulses palpable and equal bilaterally.          Assessment & Plan:  1. Essential hypertension, benign Blood pressure doing much better.  Same medication regimen. - Basic metabolic panel  2. Renal insufficiency Last Cr check - 1.0.  Doing well.  Recheck today.    3. Hypokalemia Not taking any potassium supplements.  Last potassium check wnl.  Recheck today since on aldactone.    HEALTH MAINTENANCE.  Physical 02/27/14.   She is s/p hysterectomy and does not require yearly pap smears.  Reports had colonoscopy 2010.   Mammogram 09/02/14 - [facility] I.

## 2014-09-05 NOTE — Progress Notes (Signed)
Pre visit review using our clinic review tool, if applicable. No additional management support is needed unless otherwise documented below in the visit note. 

## 2014-10-01 ENCOUNTER — Other Ambulatory Visit: Payer: Self-pay | Admitting: Internal Medicine

## 2014-10-02 ENCOUNTER — Encounter: Payer: Self-pay | Admitting: Internal Medicine

## 2014-10-08 ENCOUNTER — Encounter: Payer: Self-pay | Admitting: Nurse Practitioner

## 2014-10-08 ENCOUNTER — Ambulatory Visit (INDEPENDENT_AMBULATORY_CARE_PROVIDER_SITE_OTHER): Payer: Commercial Managed Care - HMO | Admitting: Nurse Practitioner

## 2014-10-08 VITALS — BP 124/70 | HR 65 | Temp 98.0°F | Resp 14 | Ht 62.5 in | Wt 146.5 lb

## 2014-10-08 DIAGNOSIS — M1711 Unilateral primary osteoarthritis, right knee: Secondary | ICD-10-CM

## 2014-10-08 DIAGNOSIS — M179 Osteoarthritis of knee, unspecified: Secondary | ICD-10-CM

## 2014-10-08 NOTE — Progress Notes (Signed)
Subjective:    Patient ID: Christina Cole, female    DOB: [birth year], 73 y.o.   MRN: [id number]  HPI  Christina Cole is a 73 yo female here with a CC of Right knee pain.   No tauma, no surgical history Onset 4-5 weeks ago. Worse when standing on her feet long periods of time.  Pain is location anterior and interior to patella as well as the generalized knee. She heard a click once and did not notice again. The pain is intermittent associated with activity, happens when getting out of bed in the morning and is stiff and painful for about 30 min. She describes the pain as sore. Pt denies no swelling, bruising, temperature change, or erythema. Treatment to date has been trial of a knee brace that she had at her home. This was not helpful. Heating pad was tried and helped somewhat for a short period of time. Has not tried OTC medications or ice. Pt is able to go from first floor to second up stairs to bedroom in home with some stiffness and soreness described.  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   Review of Systems  Constitutional: Negative for fever, chills, diaphoresis and fatigue.  Musculoskeletal: Positive for arthralgias. Negative for joint swelling and gait problem.       Right knee  Skin: Negative for color change and rash.  Hematological: Does not bruise/bleed easily.   Past Medical History  Diagnosis Date  . Hypertension   . Hyperlipidemia   . Thyroid disease   . Heart murmur     History   Social History  . Marital Status: Single    Spouse Name: N/A    Number of Children: 3  . Years of Education: N/A   Occupational History  . Not on file.   Social History Main Topics  . Smoking status: Never Smoker   . Smokeless tobacco: Never Used  . Alcohol Use: No  . Drug Use: No  . Sexual Activity: Not on file   Other Topics Concern  . Not on file   Social History Narrative    Past Surgical History  Procedure Laterality Date  . Appendectomy   1978  . Abdominal hysterectomy  1978    ovaries left in place  . Tubal ligation      Family History  Problem Relation Age of Onset  . Stroke Mother   . Hypertension Mother   . Arthritis Mother   . Cancer Sister     Breast  . Cancer Sister     Breast  . Lung cancer Brother   . Diabetes Brother     Allergies  Allergen Reactions  . Sulfa Antibiotics Rash    Current Outpatient Prescriptions on File Prior to Visit  Medication Sig Dispense Refill  . atorvastatin (LIPITOR) 40 MG tablet TAKE 1 TABLET BY MOUTH ONCE A DAY 30 tablet 5  . diltiazem (TIAZAC) 360 MG 24 hr capsule TAKE ONE CAPSULE BY MOUTH EVERY DAY 30 capsule 3  . EPINEPHrine (EPIPEN JR) 0.15 MG/0.3ML injection Inject 0.15 mg into the muscle as needed for anaphylaxis.    Marland Kitchen losartan (COZAAR) 100 MG tablet TAKE 1 TABLET BY MOUTH ONCE A DAY 30 tablet 3  . Polyvinyl Alcohol-Povidone (REFRESH OP) Apply to eye.    . spironolactone (ALDACTONE) 50 MG tablet TAKE 1 TABLET (50 MG TOTAL) BY MOUTH DAILY. 30 tablet 1   No current facility-administered medications on file prior to  visit.       Objective:   Physical Exam  Constitutional: She is oriented to person, place, and time. She appears well-developed and well-nourished. No distress.  Musculoskeletal: Normal range of motion. She exhibits no edema or tenderness.  Mild crepitus noted in both knees. Right was not worse than left. Strength 5/5 in LEs, Patellar reflexes 2+ bilaterally, Right knee no laxity in joint, and Right knee no point tenderness.   Neurological: She is alert and oriented to person, place, and time.  Skin: Skin is warm and dry. No rash noted. She is not diaphoretic.  Psychiatric: She has a normal mood and affect. Her behavior is normal. Judgment and thought content normal.     BP 124/70 mmHg  Pulse 65  Temp(Src) 98 F (36.7 C)  Resp 14  Ht 5' 2.5" (1.588 m)  Wt 146 lb 8 oz (66.452 kg)  BMI 26.35 kg/m2  SpO2 99%  LMP 12/21/1976       Assessment &  Plan:

## 2014-10-08 NOTE — Assessment & Plan Note (Addendum)
New onset. Suspect OA from history and physical. Discussed using ice and how to use it appropriately, staying active, using Zostrix OTC cream on knee, Tylenol is also helpful. Gave handout on knee pain. Discussed calling us if change in symptoms, worsens, or red flags appear (discussed). Will see Dr. Nicki Reaper in March. Asked her to call for appointment if treatment advice not helpful.

## 2014-10-08 NOTE — Patient Instructions (Signed)
Zostrix cream can be found over the counter and applied to the knee. Please follow box directions.   Ice is helpful, place something between ice and knee (cloth for example) and use no longer than 15 min. Can reapply after a break.   If this worsens or changes please call us.   Knee Pain The knee is the complex joint between your thigh and your lower leg. It is made up of bones, tendons, ligaments, and cartilage. The bones that make up the knee are:  The femur in the thigh.  The tibia and fibula in the lower leg.  The patella or kneecap riding in the groove on the lower femur. CAUSES  Knee pain is a common complaint with many causes. A few of these causes are:  Injury, such as:  A ruptured ligament or tendon injury.  Torn cartilage.  Medical conditions, such as:  Gout  Arthritis  Infections  Overuse, over training, or overdoing a physical activity. Knee pain can be minor or severe. Knee pain can accompany debilitating injury. Minor knee problems often respond well to self-care measures or get well on their own. More serious injuries may need medical intervention or even surgery. SYMPTOMS The knee is complex. Symptoms of knee problems can vary widely. Some of the problems are:  Pain with movement and weight bearing.  Swelling and tenderness.  Buckling of the knee.  Inability to straighten or extend your knee.  Your knee locks and you cannot straighten it.  Warmth and redness with pain and fever.  Deformity or dislocation of the kneecap. DIAGNOSIS  Determining what is wrong may be very straight forward such as when there is an injury. It can also be challenging because of the complexity of the knee. Tests to make a diagnosis may include:  Your caregiver taking a history and doing a physical exam.  Routine X-rays can be used to rule out other problems. X-rays will not reveal a cartilage tear. Some injuries of the knee can be diagnosed by:  Arthroscopy a surgical  technique by which a small video camera is inserted through tiny incisions on the sides of the knee. This procedure is used to examine and repair internal knee joint problems. Tiny instruments can be used during arthroscopy to repair the torn knee cartilage (meniscus).  Arthrography is a radiology technique. A contrast liquid is directly injected into the knee joint. Internal structures of the knee joint then become visible on X-ray film.  An MRI scan is a non X-ray radiology procedure in which magnetic fields and a computer produce two- or three-dimensional images of the inside of the knee. Cartilage tears are often visible using an MRI scanner. MRI scans have largely replaced arthrography in diagnosing cartilage tears of the knee.  Blood work.  Examination of the fluid that helps to lubricate the knee joint (synovial fluid). This is done by taking a sample out using a needle and a syringe. TREATMENT The treatment of knee problems depends on the cause. Some of these treatments are:  Depending on the injury, proper casting, splinting, surgery, or physical therapy care will be needed.  Give yourself adequate recovery time. Do not overuse your joints. If you begin to get sore during workout routines, back off. Slow down or do fewer repetitions.  For repetitive activities such as cycling or running, maintain your strength and nutrition.  Alternate muscle groups. For example, if you are a weight lifter, work the upper body on one day and the lower body the  next.  Either tight or weak muscles do not give the proper support for your knee. Tight or weak muscles do not absorb the stress placed on the knee joint. Keep the muscles surrounding the knee strong.  Take care of mechanical problems.  If you have flat feet, orthotics or special shoes may help. See your caregiver if you need help.  Arch supports, sometimes with wedges on the inner or outer aspect of the heel, can help. These can shift  pressure away from the side of the knee most bothered by osteoarthritis.  A brace called an "unloader" brace also may be used to help ease the pressure on the most arthritic side of the knee.  If your caregiver has prescribed crutches, braces, wraps or ice, use as directed. The acronym for this is PRICE. This means protection, rest, ice, compression, and elevation.  Nonsteroidal anti-inflammatory drugs (NSAIDs), can help relieve pain. But if taken immediately after an injury, they may actually increase swelling. Take NSAIDs with food in your stomach. Stop them if you develop stomach problems. Do not take these if you have a history of ulcers, stomach pain, or bleeding from the bowel. Do not take without your caregiver's approval if you have problems with fluid retention, heart failure, or kidney problems.  For ongoing knee problems, physical therapy may be helpful.  Glucosamine and chondroitin are over-the-counter dietary supplements. Both may help relieve the pain of osteoarthritis in the knee. These medicines are different from the usual anti-inflammatory drugs. Glucosamine may decrease the rate of cartilage destruction.  Injections of a corticosteroid drug into your knee joint may help reduce the symptoms of an arthritis flare-up. They may provide pain relief that lasts a few months. You may have to wait a few months between injections. The injections do have a small increased risk of infection, water retention, and elevated blood sugar levels.  Hyaluronic acid injected into damaged joints may ease pain and provide lubrication. These injections may work by reducing inflammation. A series of shots may give relief for as long as 6 months.  Topical painkillers. Applying certain ointments to your skin may help relieve the pain and stiffness of osteoarthritis. Ask your pharmacist for suggestions. Many over the-counter products are approved for temporary relief of arthritis pain.  In some countries,  doctors often prescribe topical NSAIDs for relief of chronic conditions such as arthritis and tendinitis. A review of treatment with NSAID creams found that they worked as well as oral medications but without the serious side effects. PREVENTION  Maintain a healthy weight. Extra pounds put more strain on your joints.  Get strong, stay limber. Weak muscles are a common cause of knee injuries. Stretching is important. Include flexibility exercises in your workouts.  Be smart about exercise. If you have osteoarthritis, chronic knee pain or recurring injuries, you may need to change the way you exercise. This does not mean you have to stop being active. If your knees ache after jogging or playing basketball, consider switching to swimming, water aerobics, or other low-impact activities, at least for a few days a week. Sometimes limiting high-impact activities will provide relief.  Make sure your shoes fit well. Choose footwear that is right for your sport.  Protect your knees. Use the proper gear for knee-sensitive activities. Use kneepads when playing volleyball or laying carpet. Buckle your seat belt every time you drive. Most shattered kneecaps occur in car accidents.  Rest when you are tired. SEEK MEDICAL CARE IF:  You have knee pain  that is continual and does not seem to be getting better.  SEEK IMMEDIATE MEDICAL CARE IF:  Your knee joint feels hot to the touch and you have a high fever. MAKE SURE YOU:   Understand these instructions.  Will watch your condition.  Will get help right away if you are not doing well or get worse. Document Released: 08/14/2007 Document Revised: 01/09/2012 Document Reviewed: 08/14/2007 [facility] Patient Information 2015 [place], [place]. This information is not intended to replace advice given to you by your health care provider. Make sure you discuss any questions you have with your health care provider.

## 2014-10-08 NOTE — Progress Notes (Signed)
Pre visit review using our clinic review tool, if applicable. No additional management support is needed unless otherwise documented below in the visit note. 

## 2014-11-13 DIAGNOSIS — H5210 Myopia, unspecified eye: Secondary | ICD-10-CM | POA: Diagnosis not present

## 2014-11-13 DIAGNOSIS — H524 Presbyopia: Secondary | ICD-10-CM | POA: Diagnosis not present

## 2014-11-13 DIAGNOSIS — H2513 Age-related nuclear cataract, bilateral: Secondary | ICD-10-CM | POA: Diagnosis not present

## 2014-11-16 ENCOUNTER — Other Ambulatory Visit: Payer: Self-pay | Admitting: Internal Medicine

## 2014-12-02 ENCOUNTER — Other Ambulatory Visit: Payer: Self-pay | Admitting: Internal Medicine

## 2014-12-15 ENCOUNTER — Other Ambulatory Visit: Payer: Self-pay | Admitting: Internal Medicine

## 2014-12-29 ENCOUNTER — Other Ambulatory Visit: Payer: Self-pay | Admitting: Internal Medicine

## 2015-01-05 ENCOUNTER — Ambulatory Visit (INDEPENDENT_AMBULATORY_CARE_PROVIDER_SITE_OTHER): Payer: Commercial Managed Care - HMO | Admitting: Internal Medicine

## 2015-01-05 ENCOUNTER — Encounter: Payer: Self-pay | Admitting: Internal Medicine

## 2015-01-05 VITALS — BP 130/80 | HR 70 | Temp 98.1°F | Ht 62.5 in | Wt 147.0 lb

## 2015-01-05 DIAGNOSIS — E7800 Pure hypercholesterolemia, unspecified: Secondary | ICD-10-CM

## 2015-01-05 DIAGNOSIS — D649 Anemia, unspecified: Secondary | ICD-10-CM | POA: Diagnosis not present

## 2015-01-05 DIAGNOSIS — N289 Disorder of kidney and ureter, unspecified: Secondary | ICD-10-CM | POA: Diagnosis not present

## 2015-01-05 DIAGNOSIS — E876 Hypokalemia: Secondary | ICD-10-CM

## 2015-01-05 DIAGNOSIS — Z0000 Encounter for general adult medical examination without abnormal findings: Secondary | ICD-10-CM

## 2015-01-05 DIAGNOSIS — E079 Disorder of thyroid, unspecified: Secondary | ICD-10-CM

## 2015-01-05 DIAGNOSIS — I10 Essential (primary) hypertension: Secondary | ICD-10-CM

## 2015-01-05 LAB — BASIC METABOLIC PANEL
BUN: 15 mg/dL (ref 6–23)
CHLORIDE: 102 meq/L (ref 96–112)
CO2: 30 meq/L (ref 19–32)
Calcium: 9.8 mg/dL (ref 8.4–10.5)
Creatinine, Ser: 1.03 mg/dL (ref 0.40–1.20)
GFR: 67.81 mL/min (ref >60.00)
Glucose, Bld: 89 mg/dL (ref 70–99)
POTASSIUM: 4.3 meq/L (ref 3.5–5.1)
SODIUM: 136 meq/L (ref 135–145)

## 2015-01-05 LAB — CBC WITH DIFFERENTIAL/PLATELET
BASOS ABS: 0 10*3/uL (ref 0.0–0.1)
Basophils Relative: 0.6 % (ref 0.0–3.0)
EOS ABS: 0.1 10*3/uL (ref 0.0–0.7)
Eosinophils Relative: 2.3 % (ref 0.0–5.0)
HCT: 35.4 % — ABNORMAL LOW (ref 36.0–46.0)
Hemoglobin: 11.8 g/dL — ABNORMAL LOW (ref 12.0–15.0)
LYMPHS PCT: 46.7 % — AB (ref 12.0–46.0)
Lymphs Abs: 2.3 10*3/uL (ref 0.7–4.0)
MCHC: 33.2 g/dL (ref 30.0–36.0)
MCV: 87.2 fl (ref 78.0–100.0)
MONO ABS: 0.4 10*3/uL (ref 0.1–1.0)
Monocytes Relative: 7.4 % (ref 3.0–12.0)
Neutro Abs: 2.1 10*3/uL (ref 1.4–7.7)
Neutrophils Relative %: 43 % (ref 43.0–77.0)
PLATELETS: 227 10*3/uL (ref 150.0–400.0)
RBC: 4.06 Mil/uL (ref 3.87–5.11)
RDW: 14.7 % (ref 11.5–15.5)
WBC: 4.9 10*3/uL (ref 4.0–10.5)

## 2015-01-05 LAB — LIPID PANEL
Cholesterol: 164 mg/dL (ref 0–200)
HDL: 51.8 mg/dL (ref >39.00)
LDL CALC: 89 mg/dL (ref 0–99)
NonHDL: 112.2
Total CHOL/HDL Ratio: 3
Triglycerides: 117 mg/dL (ref 0.0–149.0)
VLDL: 23.4 mg/dL (ref 0.0–40.0)

## 2015-01-05 LAB — HEPATIC FUNCTION PANEL
ALBUMIN: 4.4 g/dL (ref 3.5–5.2)
ALT: 15 U/L (ref 0–35)
AST: 13 U/L (ref 0–37)
Alkaline Phosphatase: 59 U/L (ref 39–117)
BILIRUBIN DIRECT: 0.1 mg/dL (ref 0.0–0.3)
TOTAL PROTEIN: 7.8 g/dL (ref 6.0–8.3)
Total Bilirubin: 0.7 mg/dL (ref 0.2–1.2)

## 2015-01-05 LAB — FERRITIN: Ferritin: 137.2 ng/mL (ref 10.0–291.0)

## 2015-01-05 NOTE — Progress Notes (Signed)
Pre visit review using our clinic review tool, if applicable. No additional management support is needed unless otherwise documented below in the visit note. 

## 2015-01-06 ENCOUNTER — Encounter: Payer: Self-pay | Admitting: *Deleted

## 2015-01-11 ENCOUNTER — Encounter: Payer: Self-pay | Admitting: Internal Medicine

## 2015-01-11 DIAGNOSIS — Z0000 Encounter for general adult medical examination without abnormal findings: Secondary | ICD-10-CM | POA: Insufficient documentation

## 2015-01-11 NOTE — Assessment & Plan Note (Signed)
Blood pressure is doing better.  Same medication regimen.  Check metabolic panel.

## 2015-01-11 NOTE — Assessment & Plan Note (Signed)
Low cholesterol diet and exercise.  On atorvastatin.  Check lipid panel and liver function tests.

## 2015-01-11 NOTE — Assessment & Plan Note (Signed)
Followed by Dr Carmela Rima.  He does periodic ultrasounds.  Follow thyroid function.

## 2015-01-11 NOTE — Assessment & Plan Note (Signed)
On spironolactone now.  No potassium supplements.  Recheck potassium today.

## 2015-01-11 NOTE — Assessment & Plan Note (Signed)
Check cbc today.  Last check 11/4.  Was referred to GI (Dr Allen Norris).  Felt no further w/up warranted.  Follow.

## 2015-01-11 NOTE — Progress Notes (Signed)
Patient ID: Christina Cole, female   DOB: Sep 08, 1941, 74 y.o.   MRN: [id number]   Subjective:    Patient ID: Christina Cole, female    DOB: [birth year], 74 y.o.   MRN: [id number]  HPI  Patient here for a scheduled follow up.  States she is doing well.  Feels she is handling stress relatively well.  Tries to stay active.  No cardiac symptoms with increased activity or exertion.  Breathing stable.  Bowels stable.     Past Medical History  Diagnosis Date  . Hypertension   . Hyperlipidemia   . Thyroid disease   . Heart murmur     Current Outpatient Prescriptions on File Prior to Visit  Medication Sig Dispense Refill  . atorvastatin (LIPITOR) 40 MG tablet TAKE 1 TABLET BY MOUTH ONCE A DAY 30 tablet 5  . diltiazem (TIAZAC) 360 MG 24 hr capsule TAKE ONE CAPSULE BY MOUTH EVERY DAY 30 capsule 3  . EPINEPHrine (EPIPEN JR) 0.15 MG/0.3ML injection Inject 0.15 mg into the muscle as needed for anaphylaxis.    Marland Kitchen losartan (COZAAR) 100 MG tablet TAKE 1 TABLET BY MOUTH ONCE A DAY 30 tablet 3  . Polyvinyl Alcohol-Povidone (REFRESH OP) Apply to eye.    . spironolactone (ALDACTONE) 50 MG tablet TAKE 1 TABLET BY MOUTH EVERY DAY 30 tablet 1   No current facility-administered medications on file prior to visit.    Review of Systems  Constitutional: Negative for appetite change and unexpected weight change.  HENT: Negative for congestion and sinus pressure.   Respiratory: Negative for cough, chest tightness and shortness of breath.   Cardiovascular: Negative for chest pain, palpitations and leg swelling.  Gastrointestinal: Negative for nausea, vomiting, abdominal pain and diarrhea.  Neurological: Negative for dizziness, light-headedness and headaches.       Objective:     Pulse recheck:  64  Physical Exam  HENT:  Nose: Nose normal.  Mouth/Throat: Oropharynx is clear and moist.  Neck: Neck supple. No thyromegaly present.  Cardiovascular: Normal rate and regular rhythm.   Pulmonary/Chest:  Breath sounds normal. No respiratory distress. She has no wheezes.  Abdominal: Soft. Bowel sounds are normal. There is no tenderness.  Musculoskeletal: She exhibits no edema or tenderness.  Lymphadenopathy:    She has no cervical adenopathy.    BP 130/80 mmHg  Pulse 70  Temp(Src) 98.1 F (36.7 C) (Oral)  Ht 5' 2.5" (1.588 m)  Wt 147 lb (66.679 kg)  BMI 26.44 kg/m2  SpO2 98%  LMP 12/21/1976 Wt Readings from Last 3 Encounters:  01/05/15 147 lb (66.679 kg)  10/08/14 146 lb 8 oz (66.452 kg)  09/05/14 147 lb 8 oz (66.906 kg)     Lab Results  Component Value Date   WBC 4.9 01/05/2015   HGB 11.8* 01/05/2015   HCT 35.4* 01/05/2015   PLT 227.0 01/05/2015   GLUCOSE 89 01/05/2015   CHOL 164 01/05/2015   TRIG 117.0 01/05/2015   HDL 51.80 01/05/2015   LDLCALC 89 01/05/2015   ALT 15 01/05/2015   AST 13 01/05/2015   NA 136 01/05/2015   K 4.3 01/05/2015   CL 102 01/05/2015   CREATININE 1.03 01/05/2015   BUN 15 01/05/2015   CO2 30 01/05/2015   TSH 1.77 07/02/2014       Assessment & Plan:   Problem List Items Addressed This Visit    Anemia    Check cbc today.  Last check 11/4.  Was referred to GI (Dr Allen Norris).  Felt  no further w/up warranted.  Follow.       Relevant Orders   CBC with Differential/Platelet (Completed)   Essential hypertension, benign - Primary    Blood pressure is doing better.  Same medication regimen.  Check metabolic panel.        Relevant Orders   Basic metabolic panel (Completed)   Health care maintenance    Physical 02/27/14.  Colonoscopy 2010.  Mammogram 09/02/14 - birads I.       Hypercholesterolemia    Low cholesterol diet and exercise.  On atorvastatin.  Check lipid panel and liver function tests.       Relevant Orders   Hepatic function panel (Completed)   Lipid panel (Completed)   Hypokalemia    On spironolactone now.  No potassium supplements.  Recheck potassium today.       Renal insufficiency    Last Cr 1.1 (09/05/14).  Recheck  metabolic panel today.        Relevant Orders   Ferritin (Completed)   Thyroid disease    Followed by Dr Carmela Rima.  He does periodic ultrasounds.  Follow thyroid function.            Einar Pheasant, MD

## 2015-01-11 NOTE — Assessment & Plan Note (Signed)
Physical 02/27/14.  Colonoscopy 2010.  Mammogram 09/02/14 - birads I.

## 2015-01-11 NOTE — Assessment & Plan Note (Signed)
Last Cr 1.1 (09/05/14).  Recheck metabolic panel today.

## 2015-01-28 ENCOUNTER — Other Ambulatory Visit: Payer: Self-pay | Admitting: Internal Medicine

## 2015-01-29 ENCOUNTER — Other Ambulatory Visit: Payer: Self-pay | Admitting: Internal Medicine

## 2015-02-02 DIAGNOSIS — E041 Nontoxic single thyroid nodule: Secondary | ICD-10-CM | POA: Diagnosis not present

## 2015-02-02 DIAGNOSIS — E049 Nontoxic goiter, unspecified: Secondary | ICD-10-CM | POA: Diagnosis not present

## 2015-02-02 DIAGNOSIS — I10 Essential (primary) hypertension: Secondary | ICD-10-CM | POA: Diagnosis not present

## 2015-02-02 DIAGNOSIS — E785 Hyperlipidemia, unspecified: Secondary | ICD-10-CM | POA: Diagnosis not present

## 2015-02-09 DIAGNOSIS — E041 Nontoxic single thyroid nodule: Secondary | ICD-10-CM | POA: Diagnosis not present

## 2015-02-09 DIAGNOSIS — E049 Nontoxic goiter, unspecified: Secondary | ICD-10-CM | POA: Diagnosis not present

## 2015-02-09 DIAGNOSIS — I10 Essential (primary) hypertension: Secondary | ICD-10-CM | POA: Diagnosis not present

## 2015-02-09 DIAGNOSIS — E785 Hyperlipidemia, unspecified: Secondary | ICD-10-CM | POA: Diagnosis not present

## 2015-02-25 ENCOUNTER — Ambulatory Visit (INDEPENDENT_AMBULATORY_CARE_PROVIDER_SITE_OTHER): Payer: Commercial Managed Care - HMO

## 2015-02-25 VITALS — BP 124/72 | HR 87 | Temp 98.3°F | Resp 12 | Ht 62.5 in | Wt 147.5 lb

## 2015-02-25 DIAGNOSIS — Z0000 Encounter for general adult medical examination without abnormal findings: Secondary | ICD-10-CM | POA: Diagnosis not present

## 2015-02-25 DIAGNOSIS — Z23 Encounter for immunization: Secondary | ICD-10-CM | POA: Diagnosis not present

## 2015-02-25 NOTE — Progress Notes (Signed)
Subjective:   Christina Cole is a 74 y.o. female who presents for Medicare Annual (Subsequent) preventive examination.  Review of Systems:   Cardiac Risk Factors include: advanced age (>51men, >103 women);hypertension     Objective:     Vitals: BP 124/72 mmHg  Pulse 87  Temp(Src) 98.3 F (36.8 C) (Oral)  Resp 12  Ht 5' 2.5" (1.588 m)  Wt 147 lb 8 oz (66.906 kg)  BMI 26.53 kg/m2  SpO2 97%  LMP 12/21/1976  Tobacco History  Smoking status  . Never Smoker   Smokeless tobacco  . Never Used     Counseling given: Not Answered   Past Medical History  Diagnosis Date  . Hypertension   . Hyperlipidemia   . Thyroid disease   . Heart murmur    Past Surgical History  Procedure Laterality Date  . Appendectomy  1978  . Abdominal hysterectomy  1978    ovaries left in place  . Tubal ligation     Family History  Problem Relation Age of Onset  . Stroke Mother   . Hypertension Mother   . Arthritis Mother   . Cancer Sister     Breast  . Cancer Sister     Breast  . Lung cancer Brother   . Diabetes Brother    History  Sexual Activity  . Sexual Activity: Not on file    Outpatient Encounter Prescriptions as of 02/25/2015  Medication Sig  . atorvastatin (LIPITOR) 40 MG tablet TAKE 1 TABLET BY MOUTH ONCE A DAY  . diltiazem (TIAZAC) 360 MG 24 hr capsule TAKE ONE CAPSULE BY MOUTH EVERY DAY  . EPINEPHrine (EPIPEN JR) 0.15 MG/0.3ML injection Inject 0.15 mg into the muscle as needed for anaphylaxis.  Marland Kitchen EPIPEN 2-PAK 0.3 MG/0.3ML SOAJ injection USE AS DIRECTED  . losartan (COZAAR) 100 MG tablet TAKE 1 TABLET BY MOUTH ONCE A DAY  . Polyvinyl Alcohol-Povidone (REFRESH OP) Apply to eye.  . spironolactone (ALDACTONE) 50 MG tablet TAKE 1 TABLET BY MOUTH EVERY DAY    Activities of Daily Living In your present state of health, do you have any difficulty performing the following activities: 02/25/2015  Hearing? N  Vision? N  Difficulty concentrating or making decisions? N    Walking or climbing stairs? N  Dressing or bathing? N  Doing errands, shopping? N  Preparing Food and eating ? N  Using the Toilet? N  In the past six months, have you accidently leaked urine? N  Do you have problems with loss of bowel control? N  Managing your Medications? N  Managing your Finances? N  Housekeeping or managing your Housekeeping? N    Patient Care Team: Einar Pheasant, MD as PCP - General (Internal Medicine)    Assessment:     Exercise Activities and Dietary recommendations Current Exercise Habits:: The patient does not participate in regular exercise at present (Patient is very active in yard work and helping out at church.)  Goals    . Increase physical activity     Try walking for 30 minutes at least 3 days a week.      Fall Risk Fall Risk  02/25/2015 02/27/2014 12/30/2012  Falls in the past year? No No No   Depression Screen PHQ 2/9 Scores 02/25/2015 02/27/2014 12/30/2012  PHQ - 2 Score 0 0 0     Cognitive Testing MMSE - Mini Mental State Exam 02/25/2015  Orientation to time 5  Orientation to Place 5  Registration 3  Attention/ Calculation  5  Recall 3  Language- name 2 objects 2  Language- repeat 1  Language- follow 3 step command 3  Language- read & follow direction 1  Write a sentence 1  Copy design 1  Total score 30    Immunization History  Administered Date(s) Administered  . DTaP 12/21/2010  . Influenza Split 07/21/2013  . Influenza-Unspecified 07/02/2014  . Pneumococcal Conjugate-13 12/22/2011   Screening Tests Health Maintenance  Topic Date Due  . ZOSTAVAX  05/13/2003  . PNA vac Low Risk Adult (2 of 2 - PPSV23) 12/21/2012  . INFLUENZA VACCINE  06/01/2015  . MAMMOGRAM  09/03/2015  . COLONOSCOPY  12/21/2018  . TETANUS/TDAP  10/31/2020  . DEXA SCAN  Completed      Plan:    During the course of the visit the patient was educated and counseled about the following appropriate screening and preventive services:   Vaccines to  include Pneumoccal, Influenza, Hepatitis B, Td, Zostavax, HCV  Electrocardiogram  Cardiovascular Disease  Colorectal cancer screening  Bone density screening  Diabetes screening  Glaucoma screening  Mammography/PAP  Nutrition counseling   Patient Instructions (the written plan) was given to the patient.   Geni Bers, LPN  02/09/8785

## 2015-02-25 NOTE — Patient Instructions (Addendum)
Christina Cole , Thank you for taking time to come for your Medicare Wellness Visit. I appreciate your ongoing commitment to your health goals. Please review the following plan we discussed and let me know if I can assist you in the future.   These are the goals we discussed: Goals    . Increase physical activity     Try walking for 30 minutes at least 3 days a week.       This is a list of the screening recommended for you and due dates:  Health Maintenance  Topic Date Due  . Shingles Vaccine  05/13/2003  . Pneumonia vaccines (2 of 2 - PPSV23) 12/21/2012  . Flu Shot  06/01/2015  . Mammogram  09/03/2015  . Colon Cancer Screening  12/21/2018  . Tetanus Vaccine  10/31/2020  . DEXA scan (bone density measurement)  Completed    Health Maintenance Adopting a healthy lifestyle and getting preventive care can go a long way to promote health and wellness. Talk with your health care provider about what schedule of regular examinations is right for you. This is a good chance for you to check in with your provider about disease prevention and staying healthy. In between checkups, there are plenty of things you can do on your own. Experts have done a lot of research about which lifestyle changes and preventive measures are most likely to keep you healthy. Ask your health care provider for more information. WEIGHT AND DIET  Eat a healthy diet  Be sure to include plenty of vegetables, fruits, low-fat dairy products, and lean protein.  Do not eat a lot of foods high in solid fats, added sugars, or salt.  Get regular exercise. This is one of the most important things you can do for your health.  Most adults should exercise for at least 150 minutes each week. The exercise should increase your heart rate and make you sweat (moderate-intensity exercise).  Most adults should also do strengthening exercises at least twice a week. This is in addition to the moderate-intensity exercise.  Maintain a  healthy weight  Body mass index (BMI) is a measurement that can be used to identify possible weight problems. It estimates body fat based on height and weight. Your health care provider can help determine your BMI and help you achieve or maintain a healthy weight.  For females 16 years of age and older:   A BMI below 18.5 is considered underweight.  A BMI of 18.5 to 24.9 is normal.  A BMI of 25 to 29.9 is considered overweight.  A BMI of 30 and above is considered obese.  Watch levels of cholesterol and blood lipids  You should start having your blood tested for lipids and cholesterol at 72 years of age, then have this test every 5 years.  You may need to have your cholesterol levels checked more often if:  Your lipid or cholesterol levels are high.  You are older than 72 years of age.  You are at high risk for heart disease.  CANCER SCREENING   Lung Cancer  Lung cancer screening is recommended for adults 6-55 years old who are at high risk for lung cancer because of a history of smoking.  A yearly low-dose CT scan of the lungs is recommended for people who:  Currently smoke.  Have quit within the past 15 years.  Have at least a 30-pack-year history of smoking. A pack year is smoking an average of one pack of cigarettes a  day for 1 year.  Yearly screening should continue until it has been 15 years since you quit.  Yearly screening should stop if you develop a health problem that would prevent you from having lung cancer treatment.  Breast Cancer  Practice breast self-awareness. This means understanding how your breasts normally appear and feel.  It also means doing regular breast self-exams. Let your health care provider know about any changes, no matter how small.  If you are in your 20s or 30s, you should have a clinical breast exam (CBE) by a health care provider every 1-3 years as part of a regular health exam.  If you are 63 or older, have a CBE every year.  Also consider having a breast X-ray (mammogram) every year.  If you have a family history of breast cancer, talk to your health care provider about genetic screening.  If you are at high risk for breast cancer, talk to your health care provider about having an MRI and a mammogram every year.  Breast cancer gene (BRCA) assessment is recommended for women who have family members with BRCA-related cancers. BRCA-related cancers include:  Breast.  Ovarian.  Tubal.  Peritoneal cancers.  Results of the assessment will determine the need for genetic counseling and BRCA1 and BRCA2 testing. Cervical Cancer Routine pelvic examinations to screen for cervical cancer are no longer recommended for nonpregnant women who are considered low risk for cancer of the pelvic organs (ovaries, uterus, and vagina) and who do not have symptoms. A pelvic examination may be necessary if you have symptoms including those associated with pelvic infections. Ask your health care provider if a screening pelvic exam is right for you.   The Pap test is the screening test for cervical cancer for women who are considered at risk.  If you had a hysterectomy for a problem that was not cancer or a condition that could lead to cancer, then you no longer need Pap tests.  If you are older than 65 years, and you have had normal Pap tests for the past 10 years, you no longer need to have Pap tests.  If you have had past treatment for cervical cancer or a condition that could lead to cancer, you need Pap tests and screening for cancer for at least 20 years after your treatment.  If you no longer get a Pap test, assess your risk factors if they change (such as having a new sexual partner). This can affect whether you should start being screened again.  Some women have medical problems that increase their chance of getting cervical cancer. If this is the case for you, your health care provider may recommend more frequent screening and  Pap tests.  The human papillomavirus (HPV) test is another test that may be used for cervical cancer screening. The HPV test looks for the virus that can cause cell changes in the cervix. The cells collected during the Pap test can be tested for HPV.  The HPV test can be used to screen women 48 years of age and older. Getting tested for HPV can extend the interval between normal Pap tests from three to five years.  An HPV test also should be used to screen women of any age who have unclear Pap test results.  After 72 years of age, women should have HPV testing as often as Pap tests.  Colorectal Cancer  This type of cancer can be detected and often prevented.  Routine colorectal cancer screening usually begins at 72  years of age and continues through 72 years of age.  Your health care provider may recommend screening at an earlier age if you have risk factors for colon cancer.  Your health care provider may also recommend using home test kits to check for hidden blood in the stool.  A small camera at the end of a tube can be used to examine your colon directly (sigmoidoscopy or colonoscopy). This is done to check for the earliest forms of colorectal cancer.  Routine screening usually begins at age 59.  Direct examination of the colon should be repeated every 5-10 years through 72 years of age. However, you may need to be screened more often if early forms of precancerous polyps or small growths are found. Skin Cancer  Check your skin from head to toe regularly.  Tell your health care provider about any new moles or changes in moles, especially if there is a change in a mole's shape or color.  Also tell your health care provider if you have a mole that is larger than the size of a pencil eraser.  Always use sunscreen. Apply sunscreen liberally and repeatedly throughout the day.  Protect yourself by wearing long sleeves, pants, a wide-brimmed hat, and sunglasses whenever you are  outside. HEART DISEASE, DIABETES, AND HIGH BLOOD PRESSURE   Have your blood pressure checked at least every 1-2 years. High blood pressure causes heart disease and increases the risk of stroke.  If you are between 61 years and 69 years old, ask your health care provider if you should take aspirin to prevent strokes.  Have regular diabetes screenings. This involves taking a blood sample to check your fasting blood sugar level.  If you are at a normal weight and have a low risk for diabetes, have this test once every three years after 72 years of age.  If you are overweight and have a high risk for diabetes, consider being tested at a younger age or more often. PREVENTING INFECTION  Hepatitis B  If you have a higher risk for hepatitis B, you should be screened for this virus. You are considered at high risk for hepatitis B if:  You were born in a country where hepatitis B is common. Ask your health care provider which countries are considered high risk.  Your parents were born in a high-risk country, and you have not been immunized against hepatitis B (hepatitis B vaccine).  You have HIV or AIDS.  You use needles to inject street drugs.  You live with someone who has hepatitis B.  You have had sex with someone who has hepatitis B.  You get hemodialysis treatment.  You take certain medicines for conditions, including cancer, organ transplantation, and autoimmune conditions. Hepatitis C  Blood testing is recommended for:  Everyone born from [birth year] through 1963.  Anyone with known risk factors for hepatitis C. Sexually transmitted infections (STIs)  You should be screened for sexually transmitted infections (STIs) including gonorrhea and chlamydia if:  You are sexually active and are younger than 72 years of age.  You are older than 72 years of age and your health care provider tells you that you are at risk for this type of infection.  Your sexual activity has changed since  you were last screened and you are at an increased risk for chlamydia or gonorrhea. Ask your health care provider if you are at risk.  If you do not have HIV, but are at risk, it may be recommended that you  take a prescription medicine daily to prevent HIV infection. This is called pre-exposure prophylaxis (PrEP). You are considered at risk if:  You are sexually active and do not regularly use condoms or know the HIV status of your partner(s).  You take drugs by injection.  You are sexually active with a partner who has HIV. Talk with your health care provider about whether you are at high risk of being infected with HIV. If you choose to begin PrEP, you should first be tested for HIV. You should then be tested every 3 months for as long as you are taking PrEP.  PREGNANCY   If you are premenopausal and you may become pregnant, ask your health care provider about preconception counseling.  If you may become pregnant, take 400 to 800 micrograms (mcg) of folic acid every day.  If you want to prevent pregnancy, talk to your health care provider about birth control (contraception). OSTEOPOROSIS AND MENOPAUSE   Osteoporosis is a disease in which the bones lose minerals and strength with aging. This can result in serious bone fractures. Your risk for osteoporosis can be identified using a bone density scan.  If you are 75 years of age or older, or if you are at risk for osteoporosis and fractures, ask your health care provider if you should be screened.  Ask your health care provider whether you should take a calcium or vitamin D supplement to lower your risk for osteoporosis.  Menopause may have certain physical symptoms and risks.  Hormone replacement therapy may reduce some of these symptoms and risks. Talk to your health care provider about whether hormone replacement therapy is right for you.  HOME CARE INSTRUCTIONS   Schedule regular health, dental, and eye exams.  Stay current with  your immunizations.   Do not use any tobacco products including cigarettes, chewing tobacco, or electronic cigarettes.  If you are pregnant, do not drink alcohol.  If you are breastfeeding, limit how much and how often you drink alcohol.  Limit alcohol intake to no more than 1 drink per day for nonpregnant women. One drink equals 12 ounces of beer, 5 ounces of wine, or 1 ounces of hard liquor.  Do not use street drugs.  Do not share needles.  Ask your health care provider for help if you need support or information about quitting drugs.  Tell your health care provider if you often feel depressed.  Tell your health care provider if you have ever been abused or do not feel safe at home. Document Released: 05/02/2011 Document Revised: 03/03/2014 Document Reviewed: 09/18/2013 [facility] Patient Information 2015 [place], [place]. This information is not intended to replace advice given to you by your health care provider. Make sure you discuss any questions you have with your health care provider.

## 2015-03-22 ENCOUNTER — Other Ambulatory Visit: Payer: Self-pay | Admitting: Internal Medicine

## 2015-04-08 ENCOUNTER — Ambulatory Visit (INDEPENDENT_AMBULATORY_CARE_PROVIDER_SITE_OTHER): Payer: Commercial Managed Care - HMO | Admitting: Internal Medicine

## 2015-04-08 ENCOUNTER — Encounter: Payer: Self-pay | Admitting: Internal Medicine

## 2015-04-08 VITALS — BP 130/80 | HR 64 | Temp 98.0°F | Ht 62.75 in | Wt 151.2 lb

## 2015-04-08 DIAGNOSIS — D649 Anemia, unspecified: Secondary | ICD-10-CM | POA: Diagnosis not present

## 2015-04-08 DIAGNOSIS — E7800 Pure hypercholesterolemia, unspecified: Secondary | ICD-10-CM

## 2015-04-08 DIAGNOSIS — N289 Disorder of kidney and ureter, unspecified: Secondary | ICD-10-CM | POA: Diagnosis not present

## 2015-04-08 DIAGNOSIS — I10 Essential (primary) hypertension: Secondary | ICD-10-CM

## 2015-04-08 DIAGNOSIS — Z0000 Encounter for general adult medical examination without abnormal findings: Secondary | ICD-10-CM

## 2015-04-08 DIAGNOSIS — E876 Hypokalemia: Secondary | ICD-10-CM

## 2015-04-08 LAB — BASIC METABOLIC PANEL
BUN: 17 mg/dL (ref 6–23)
CO2: 30 mEq/L (ref 19–32)
Calcium: 9.8 mg/dL (ref 8.4–10.5)
Chloride: 102 mEq/L (ref 96–112)
Creatinine, Ser: 1.02 mg/dL (ref 0.40–1.20)
GFR: 68.53 mL/min (ref >60.00)
GLUCOSE: 94 mg/dL (ref 70–99)
Potassium: 4.6 mEq/L (ref 3.5–5.1)
Sodium: 135 mEq/L (ref 135–145)

## 2015-04-08 LAB — LIPID PANEL
CHOL/HDL RATIO: 4
CHOLESTEROL: 179 mg/dL (ref 0–200)
HDL: 48 mg/dL (ref >39.00)
LDL CALC: 102 mg/dL — AB (ref 0–99)
NonHDL: 131
TRIGLYCERIDES: 146 mg/dL (ref 0.0–149.0)
VLDL: 29.2 mg/dL (ref 0.0–40.0)

## 2015-04-08 LAB — HEPATIC FUNCTION PANEL
ALT: 23 U/L (ref 0–35)
AST: 15 U/L (ref 0–37)
Albumin: 4.4 g/dL (ref 3.5–5.2)
Alkaline Phosphatase: 63 U/L (ref 39–117)
BILIRUBIN TOTAL: 0.9 mg/dL (ref 0.2–1.2)
Bilirubin, Direct: 0.2 mg/dL (ref 0.0–0.3)
TOTAL PROTEIN: 7.8 g/dL (ref 6.0–8.3)

## 2015-04-08 NOTE — Progress Notes (Signed)
Patient ID: Christina Cole, female   DOB: 09/22/1941, 74 y.o.   MRN: [id number]   Subjective:    Patient ID: Christina Cole, female    DOB: [birth year], 74 y.o.   MRN: [id number]  HPI  Patient here to follow up on her current medical issues as well as for a complete physical exam.  Stays active.  Denies any cardiac symptoms with increased activity or exertion.  Breathing stable.  No increased cough or congestion.  Eating and drinking well.  Tries to watch her diet. Bowels stable.  Since cutting back on her coffee, her bladder leaking is better.  No dysuria.     Past Medical History  Diagnosis Date  . Hypertension   . Hyperlipidemia   . Thyroid disease   . Heart murmur     Current Outpatient Prescriptions on File Prior to Visit  Medication Sig Dispense Refill  . atorvastatin (LIPITOR) 40 MG tablet TAKE 1 TABLET BY MOUTH ONCE A DAY 30 tablet 5  . diltiazem (TIAZAC) 360 MG 24 hr capsule TAKE ONE CAPSULE BY MOUTH EVERY DAY 30 capsule 3  . EPINEPHrine (EPIPEN JR) 0.15 MG/0.3ML injection Inject 0.15 mg into the muscle as needed for anaphylaxis.    Marland Kitchen EPIPEN 2-PAK 0.3 MG/0.3ML SOAJ injection USE AS DIRECTED 2 Device 0  . losartan (COZAAR) 100 MG tablet TAKE 1 TABLET BY MOUTH ONCE A DAY 30 tablet 5  . Polyvinyl Alcohol-Povidone (REFRESH OP) Apply to eye.    . spironolactone (ALDACTONE) 50 MG tablet TAKE 1 TABLET BY MOUTH EVERY DAY 30 tablet 5   No current facility-administered medications on file prior to visit.    Review of Systems  Constitutional: Negative for appetite change and unexpected weight change.  HENT: Negative for congestion and sinus pressure.   Eyes: Negative for pain and visual disturbance.  Respiratory: Negative for cough, chest tightness and shortness of breath.   Cardiovascular: Negative for chest pain, palpitations and leg swelling.  Gastrointestinal: Negative for nausea, vomiting, abdominal pain and diarrhea.  Genitourinary: Negative for dysuria and difficulty  urinating.  Musculoskeletal: Negative for back pain and joint swelling.  Skin: Negative for color change and rash.  Neurological: Negative for dizziness, light-headedness and headaches.  Hematological: Negative for adenopathy. Does not bruise/bleed easily.  Psychiatric/Behavioral: Negative for dysphoric mood and agitation.       Objective:     Blood pressure recheck:  128/78, pulse 64  Physical Exam  Constitutional: She is oriented to person, place, and time. She appears well-developed and well-nourished.  HENT:  Nose: Nose normal.  Mouth/Throat: Oropharynx is clear and moist.  Eyes: Right eye exhibits no discharge. Left eye exhibits no discharge. No scleral icterus.  Neck: Neck supple. No thyromegaly present.  Cardiovascular: Normal rate and regular rhythm.   1/6 systolic murmur.    Pulmonary/Chest: Breath sounds normal. No accessory muscle usage. No tachypnea. No respiratory distress. She has no decreased breath sounds. She has no wheezes. She has no rhonchi. Right breast exhibits no inverted nipple, no mass, no nipple discharge and no tenderness (no axillary adenopathy). Left breast exhibits no inverted nipple, no mass, no nipple discharge and no tenderness (no axilarry adenopathy).  Abdominal: Soft. Bowel sounds are normal. There is no tenderness.  Musculoskeletal: She exhibits no edema or tenderness.  Lymphadenopathy:    She has no cervical adenopathy.  Neurological: She is alert and oriented to person, place, and time.  Skin: Skin is warm. No rash noted.  Psychiatric: She has a normal  mood and affect. Her behavior is normal.    BP 130/80 mmHg  Pulse 64  Temp(Src) 98 F (36.7 C) (Oral)  Ht 5' 2.75" (1.594 m)  Wt 151 lb 4 oz (68.607 kg)  BMI 27.00 kg/m2  SpO2 98%  LMP 12/21/1976 Wt Readings from Last 3 Encounters:  04/08/15 151 lb 4 oz (68.607 kg)  02/25/15 147 lb 8 oz (66.906 kg)  01/05/15 147 lb (66.679 kg)     Lab Results  Component Value Date   WBC 4.9  01/05/2015   HGB 11.8* 01/05/2015   HCT 35.4* 01/05/2015   PLT 227.0 01/05/2015   GLUCOSE 94 04/08/2015   CHOL 179 04/08/2015   TRIG 146.0 04/08/2015   HDL 48.00 04/08/2015   LDLCALC 102* 04/08/2015   ALT 23 04/08/2015   AST 15 04/08/2015   NA 135 04/08/2015   K 4.6 04/08/2015   CL 102 04/08/2015   CREATININE 1.02 04/08/2015   BUN 17 04/08/2015   CO2 30 04/08/2015   TSH 1.77 07/02/2014       Assessment & Plan:   Problem List Items Addressed This Visit    Anemia    Saw GI.  Felt no further w/up warranted.  Follow cbc.       Essential hypertension, benign - Primary    Continue current medication regimen.  Check metabolic panel.       Relevant Orders   Basic metabolic panel (Completed)   Health care maintenance    Physical today 04/08/15.  Colonoscopy 2010.  Mammogram 09/02/14 - Birads I.        Hypercholesterolemia    On atorvastatin.  Low cholesterol diet and exercise.  Follow lipid panel and liver function tests.       Relevant Orders   Lipid panel (Completed)   Hepatic function panel (Completed)   Hypokalemia    Better since being on spironolactone.        Renal insufficiency    Last Cr 1.03 in 12/2014.  Recheck kidney function today.          I spent 25 minutes with the patient and more than 50% of the time was spent in consultation regarding the above.     Einar Pheasant, MD

## 2015-04-08 NOTE — Progress Notes (Signed)
Pre visit review using our clinic review tool, if applicable. No additional management support is needed unless otherwise documented below in the visit note. 

## 2015-04-09 ENCOUNTER — Encounter: Payer: Self-pay | Admitting: *Deleted

## 2015-04-12 ENCOUNTER — Encounter: Payer: Self-pay | Admitting: Internal Medicine

## 2015-04-12 NOTE — Assessment & Plan Note (Signed)
Saw GI.  Felt no further w/up warranted.  Follow cbc.  

## 2015-04-12 NOTE — Assessment & Plan Note (Signed)
Physical today 04/08/15.  Colonoscopy 2010.  Mammogram 09/02/14 - Birads I.

## 2015-04-12 NOTE — Assessment & Plan Note (Signed)
Better since being on spironolactone.

## 2015-04-12 NOTE — Assessment & Plan Note (Signed)
Last Cr 1.03 in 12/2014.  Recheck kidney function today.

## 2015-04-12 NOTE — Assessment & Plan Note (Signed)
On atorvastatin.  Low cholesterol diet and exercise.  Follow lipid panel and liver function tests.  

## 2015-04-12 NOTE — Assessment & Plan Note (Signed)
Continue current medication regimen.  Check metabolic panel.

## 2015-04-30 ENCOUNTER — Other Ambulatory Visit: Payer: Self-pay | Admitting: Internal Medicine

## 2015-06-04 ENCOUNTER — Other Ambulatory Visit: Payer: Self-pay

## 2015-06-04 MED ORDER — LOSARTAN POTASSIUM 100 MG PO TABS: 100.0000 mg | ORAL_TABLET | Freq: Every day | ORAL | Status: DC

## 2015-06-04 MED ORDER — ATORVASTATIN CALCIUM 40 MG PO TABS: 40.0000 mg | ORAL_TABLET | Freq: Every day | ORAL | Status: DC

## 2015-06-04 MED ORDER — DILTIAZEM HCL ER BEADS 360 MG PO CP24: 360.0000 mg | ORAL_CAPSULE | Freq: Every day | ORAL | Status: DC

## 2015-06-04 MED ORDER — SPIRONOLACTONE 50 MG PO TABS: 50.0000 mg | ORAL_TABLET | Freq: Every day | ORAL | Status: DC

## 2015-06-14 ENCOUNTER — Other Ambulatory Visit: Payer: Self-pay | Admitting: Internal Medicine

## 2015-08-10 ENCOUNTER — Ambulatory Visit (INDEPENDENT_AMBULATORY_CARE_PROVIDER_SITE_OTHER): Payer: Commercial Managed Care - HMO | Admitting: Internal Medicine

## 2015-08-10 ENCOUNTER — Encounter: Payer: Self-pay | Admitting: Internal Medicine

## 2015-08-10 VITALS — BP 102/68 | HR 65 | Temp 97.7°F | Resp 18 | Ht 62.75 in | Wt 149.1 lb

## 2015-08-10 DIAGNOSIS — E079 Disorder of thyroid, unspecified: Secondary | ICD-10-CM | POA: Diagnosis not present

## 2015-08-10 DIAGNOSIS — I10 Essential (primary) hypertension: Secondary | ICD-10-CM | POA: Diagnosis not present

## 2015-08-10 DIAGNOSIS — E7800 Pure hypercholesterolemia, unspecified: Secondary | ICD-10-CM | POA: Diagnosis not present

## 2015-08-10 DIAGNOSIS — Z1239 Encounter for other screening for malignant neoplasm of breast: Secondary | ICD-10-CM

## 2015-08-10 DIAGNOSIS — D649 Anemia, unspecified: Secondary | ICD-10-CM | POA: Diagnosis not present

## 2015-08-10 DIAGNOSIS — N289 Disorder of kidney and ureter, unspecified: Secondary | ICD-10-CM

## 2015-08-10 DIAGNOSIS — E876 Hypokalemia: Secondary | ICD-10-CM

## 2015-08-10 LAB — CBC WITH DIFFERENTIAL/PLATELET
Basophils Absolute: 0 10*3/uL (ref 0.0–0.1)
Basophils Relative: 0.8 % (ref 0.0–3.0)
EOS PCT: 3.8 % (ref 0.0–5.0)
Eosinophils Absolute: 0.2 10*3/uL (ref 0.0–0.7)
HEMATOCRIT: 36.4 % (ref 36.0–46.0)
HEMOGLOBIN: 12 g/dL (ref 12.0–15.0)
LYMPHS PCT: 42.9 % (ref 12.0–46.0)
Lymphs Abs: 2.1 10*3/uL (ref 0.7–4.0)
MCHC: 33 g/dL (ref 30.0–36.0)
MCV: 88.8 fl (ref 78.0–100.0)
MONO ABS: 0.3 10*3/uL (ref 0.1–1.0)
MONOS PCT: 6.7 % (ref 3.0–12.0)
Neutro Abs: 2.3 10*3/uL (ref 1.4–7.7)
Neutrophils Relative %: 45.8 % (ref 43.0–77.0)
Platelets: 222 10*3/uL (ref 150.0–400.0)
RBC: 4.09 Mil/uL (ref 3.87–5.11)
RDW: 13.9 % (ref 11.5–15.5)
WBC: 4.9 10*3/uL (ref 4.0–10.5)

## 2015-08-10 LAB — HEPATIC FUNCTION PANEL
ALBUMIN: 4.3 g/dL (ref 3.5–5.2)
ALT: 14 U/L (ref 0–35)
AST: 14 U/L (ref 0–37)
Alkaline Phosphatase: 56 U/L (ref 39–117)
BILIRUBIN TOTAL: 0.9 mg/dL (ref 0.2–1.2)
Bilirubin, Direct: 0.2 mg/dL (ref 0.0–0.3)
Total Protein: 7.9 g/dL (ref 6.0–8.3)

## 2015-08-10 LAB — TSH: TSH: 1.57 u[IU]/mL (ref 0.35–4.50)

## 2015-08-10 LAB — BASIC METABOLIC PANEL
BUN: 14 mg/dL (ref 6–23)
CO2: 30 meq/L (ref 19–32)
Calcium: 10 mg/dL (ref 8.4–10.5)
Chloride: 102 mEq/L (ref 96–112)
Creatinine, Ser: 1.07 mg/dL (ref 0.40–1.20)
GFR: 64.78 mL/min (ref >60.00)
GLUCOSE: 101 mg/dL — AB (ref 70–99)
POTASSIUM: 4.3 meq/L (ref 3.5–5.1)
SODIUM: 138 meq/L (ref 135–145)

## 2015-08-10 LAB — LIPID PANEL
CHOLESTEROL: 143 mg/dL (ref 0–200)
HDL: 42 mg/dL (ref >39.00)
LDL CALC: 76 mg/dL (ref 0–99)
NonHDL: 100.94
TRIGLYCERIDES: 127 mg/dL (ref 0.0–149.0)
Total CHOL/HDL Ratio: 3
VLDL: 25.4 mg/dL (ref 0.0–40.0)

## 2015-08-10 LAB — FERRITIN: FERRITIN: 134.9 ng/mL (ref 10.0–291.0)

## 2015-08-10 NOTE — Assessment & Plan Note (Signed)
Saw GI.  Felt no further w/up warranted.  Recheck cbc.

## 2015-08-10 NOTE — Assessment & Plan Note (Signed)
Better since on spironolactone.  Check metabolic panel.

## 2015-08-10 NOTE — Assessment & Plan Note (Signed)
Followed by Dr Carmela Rima.  He has been doing periodic ultrasounds.  Follow thyroid function.

## 2015-08-10 NOTE — Assessment & Plan Note (Signed)
Low cholesterol diet and exercise.  On lipitor.  Follow lipid panel and liver function tests.   

## 2015-08-10 NOTE — Assessment & Plan Note (Signed)
Blood pressure under good control.  Continue same medication regimen.  Follow pressures.  Follow metabolic panel.   

## 2015-08-10 NOTE — Addendum Note (Signed)
Addended by: Karlene Einstein D on: 08/10/2015 10:03 AM   Modules accepted: Orders

## 2015-08-10 NOTE — Assessment & Plan Note (Signed)
Cr 04/08/15 - 1.02.  Recheck metabolic panel today.

## 2015-08-10 NOTE — Progress Notes (Signed)
Patient ID: Christina Cole, female   DOB: 01/07/1941, 74 y.o.   MRN: [id number]   Subjective:    Patient ID: Christina Cole, female    DOB: Dec 08, 1940, 74 y.o.   MRN: [id number]  HPI  Patient with past history of hypertension, anemia and hypercholesterolemia who comes in today to follow up on these issues.  She is doing well.  Stays active.  Still exercising.  No cardiac symptoms with increased activity or exertion.  No sob.  Eating and drinking well.  Just moved in a new place.  Living by herself.  Enjoying this.  Bowels stable.    Past Medical History  Diagnosis Date  . Hypertension   . Hyperlipidemia   . Thyroid disease   . Heart murmur    Past Surgical History  Procedure Laterality Date  . Appendectomy  1978  . Abdominal hysterectomy  1978    ovaries left in place  . Tubal ligation     Family History  Problem Relation Age of Onset  . Stroke Mother   . Hypertension Mother   . Arthritis Mother   . Cancer Sister     Breast  . Cancer Sister     Breast  . Lung cancer Brother   . Diabetes Brother    Social History   Social History  . Marital Status: Single    Spouse Name: N/A  . Number of Children: 3  . Years of Education: N/A   Social History Main Topics  . Smoking status: Never Smoker   . Smokeless tobacco: Never Used  . Alcohol Use: No  . Drug Use: No  . Sexual Activity: Not Asked   Other Topics Concern  . None   Social History Narrative    Outpatient Encounter Prescriptions as of 08/10/2015  Medication Sig  . atorvastatin (LIPITOR) 40 MG tablet Take 1 tablet (40 mg total) by mouth daily.  Marland Kitchen atorvastatin (LIPITOR) 40 MG tablet TAKE 1 TABLET BY MOUTH ONCE A DAY  . diltiazem (TIAZAC) 360 MG 24 hr capsule Take 1 capsule (360 mg total) by mouth daily.  Marland Kitchen EPINEPHrine (EPIPEN JR) 0.15 MG/0.3ML injection Inject 0.15 mg into the muscle as needed for anaphylaxis.  Marland Kitchen EPIPEN 2-PAK 0.3 MG/0.3ML SOAJ injection USE AS DIRECTED  . losartan (COZAAR) 100 MG tablet  Take 1 tablet (100 mg total) by mouth daily.  . Polyvinyl Alcohol-Povidone (REFRESH OP) Apply to eye.  . spironolactone (ALDACTONE) 50 MG tablet Take 1 tablet (50 mg total) by mouth daily.   No facility-administered encounter medications on file as of 08/10/2015.    Review of Systems  Constitutional: Negative for appetite change and unexpected weight change.  HENT: Negative for congestion and sinus pressure.   Respiratory: Negative for cough, chest tightness and shortness of breath.   Cardiovascular: Negative for chest pain, palpitations and leg swelling.  Gastrointestinal: Negative for nausea, vomiting, abdominal pain and diarrhea.  Skin: Negative for color change and rash.  Neurological: Negative for dizziness, light-headedness and headaches.  Psychiatric/Behavioral: Negative for dysphoric mood and agitation.       Objective:     Blood pressure rechecked by me:  124/74  Physical Exam  Constitutional: She appears well-developed and well-nourished. No distress.  HENT:  Nose: Nose normal.  Mouth/Throat: Oropharynx is clear and moist.  Neck: Neck supple. No thyromegaly present.  Cardiovascular: Normal rate and regular rhythm.   Pulmonary/Chest: Breath sounds normal. No respiratory distress. She has no wheezes.  Abdominal: Soft. Bowel sounds are normal.  There is no tenderness.  Musculoskeletal: She exhibits no edema or tenderness.  Lymphadenopathy:    She has no cervical adenopathy.  Skin: No rash noted. No erythema.  Psychiatric: She has a normal mood and affect. Her behavior is normal.    BP 102/68 mmHg  Pulse 65  Temp(Src) 97.7 F (36.5 C) (Oral)  Resp 18  Ht 5' 2.75" (1.594 m)  Wt 149 lb 2 oz (67.643 kg)  BMI 26.62 kg/m2  SpO2 97%  LMP 12/21/1976 Wt Readings from Last 3 Encounters:  08/10/15 149 lb 2 oz (67.643 kg)  04/08/15 151 lb 4 oz (68.607 kg)  02/25/15 147 lb 8 oz (66.906 kg)     Lab Results  Component Value Date   WBC 4.9 01/05/2015   HGB 11.8*  01/05/2015   HCT 35.4* 01/05/2015   PLT 227.0 01/05/2015   GLUCOSE 94 04/08/2015   CHOL 179 04/08/2015   TRIG 146.0 04/08/2015   HDL 48.00 04/08/2015   LDLCALC 102* 04/08/2015   ALT 23 04/08/2015   AST 15 04/08/2015   NA 135 04/08/2015   K 4.6 04/08/2015   CL 102 04/08/2015   CREATININE 1.02 04/08/2015   BUN 17 04/08/2015   CO2 30 04/08/2015   TSH 1.77 07/02/2014       Assessment & Plan:   Problem List Items Addressed This Visit    Anemia    Saw GI.  Felt no further w/up warranted.  Recheck cbc.       Relevant Orders   CBC with Differential/Platelet   Ferritin   Essential hypertension, benign    Blood pressure under good control.  Continue same medication regimen.  Follow pressures.  Follow metabolic panel.        Relevant Orders   Basic metabolic panel   Hypercholesterolemia    Low cholesterol diet and exercise.  On lipitor.  Follow lipid panel and liver function tests.        Relevant Orders   Lipid panel   Hepatic function panel   Hypokalemia    Better since on spironolactone.  Check metabolic panel.       Renal insufficiency    Cr 04/08/15 - 1.02.  Recheck metabolic panel today.       Thyroid disease    Followed by Dr Carmela Rima.  He has been doing periodic ultrasounds.  Follow thyroid function.       Relevant Orders   TSH    Other Visit Diagnoses    Breast cancer screening    -  Primary    Relevant Orders    MM DIGITAL SCREENING BILATERAL        Einar Pheasant, MD

## 2015-08-10 NOTE — Progress Notes (Signed)
Pre-visit discussion using our clinic review tool. No additional management support is needed unless otherwise documented below in the visit note.  

## 2015-08-11 ENCOUNTER — Encounter: Payer: Self-pay | Admitting: *Deleted

## 2015-09-04 ENCOUNTER — Ambulatory Visit
Admission: RE | Admit: 2015-09-04 | Discharge: 2015-09-04 | Disposition: A | Discharge status: Home / Self Care | Payer: Commercial Managed Care - HMO | Source: Ambulatory Visit | Attending: Internal Medicine | Admitting: Internal Medicine

## 2015-09-04 ENCOUNTER — Other Ambulatory Visit: Payer: Self-pay | Admitting: Internal Medicine

## 2015-09-04 DIAGNOSIS — Z1239 Encounter for other screening for malignant neoplasm of breast: Secondary | ICD-10-CM

## 2015-09-04 DIAGNOSIS — Z1231 Encounter for screening mammogram for malignant neoplasm of breast: Secondary | ICD-10-CM | POA: Diagnosis not present

## 2015-12-11 ENCOUNTER — Ambulatory Visit (INDEPENDENT_AMBULATORY_CARE_PROVIDER_SITE_OTHER): Payer: Commercial Managed Care - HMO | Admitting: Internal Medicine

## 2015-12-11 ENCOUNTER — Encounter: Payer: Self-pay | Admitting: Internal Medicine

## 2015-12-11 VITALS — BP 110/60 | HR 62 | Temp 97.8°F | Resp 18 | Ht 62.75 in | Wt 151.5 lb

## 2015-12-11 DIAGNOSIS — I10 Essential (primary) hypertension: Secondary | ICD-10-CM

## 2015-12-11 DIAGNOSIS — D649 Anemia, unspecified: Secondary | ICD-10-CM

## 2015-12-11 DIAGNOSIS — M1711 Unilateral primary osteoarthritis, right knee: Secondary | ICD-10-CM

## 2015-12-11 DIAGNOSIS — E7800 Pure hypercholesterolemia, unspecified: Secondary | ICD-10-CM | POA: Diagnosis not present

## 2015-12-11 MED ORDER — SPIRONOLACTONE 50 MG PO TABS: 50.0000 mg | ORAL_TABLET | Freq: Every day | ORAL | Status: DC

## 2015-12-11 MED ORDER — ATORVASTATIN CALCIUM 40 MG PO TABS: 40.0000 mg | ORAL_TABLET | Freq: Every day | ORAL | Status: DC

## 2015-12-11 MED ORDER — LOSARTAN POTASSIUM 100 MG PO TABS: 100.0000 mg | ORAL_TABLET | Freq: Every day | ORAL | Status: DC

## 2015-12-11 MED ORDER — DILTIAZEM HCL ER BEADS 360 MG PO CP24: 360.0000 mg | ORAL_CAPSULE | Freq: Every day | ORAL | Status: DC

## 2015-12-11 NOTE — Progress Notes (Signed)
Pre-visit discussion using our clinic review tool. No additional management support is needed unless otherwise documented below in the visit note.  

## 2015-12-11 NOTE — Progress Notes (Signed)
Patient ID: Christina Cole, female   DOB: Jul 11, 1941, 75 y.o.   MRN: [id number]   Subjective:    Patient ID: Christina Cole, female    DOB: [birth year], 75 y.o.   MRN: [id number]  HPI  Patient with past history of hypercholesterolemia, thyroid disease and hypertension.  She comes in today to follow up on these issues.  She previously had problems with her right knee.  Was swollen and painful.  Took one alleve and the swelling improved.  No pain or swelling since.  Exercises.  No chest pain or tightness with increased activity or exertion.  No sob.  No acid reflux.  No abdominal pain or cramping.  Bowels stable.     Past Medical History  Diagnosis Date  . Hypertension   . Hyperlipidemia   . Thyroid disease   . Heart murmur    Past Surgical History  Procedure Laterality Date  . Appendectomy  1978  . Abdominal hysterectomy  1978    ovaries left in place  . Tubal ligation     Family History  Problem Relation Age of Onset  . Stroke Mother   . Hypertension Mother   . Arthritis Mother   . Cancer Sister     Breast  . Breast cancer Sister 48  . Cancer Sister     Breast  . Breast cancer Sister 82  . Lung cancer Brother   . Diabetes Brother    Social History   Social History  . Marital Status: Single    Spouse Name: N/A  . Number of Children: 3  . Years of Education: N/A   Social History Main Topics  . Smoking status: Never Smoker   . Smokeless tobacco: Never Used  . Alcohol Use: No  . Drug Use: No  . Sexual Activity: Not Asked   Other Topics Concern  . None   Social History Narrative    Outpatient Encounter Prescriptions as of 12/11/2015  Medication Sig  . atorvastatin (LIPITOR) 40 MG tablet Take 1 tablet (40 mg total) by mouth daily.  Marland Kitchen diltiazem (TIAZAC) 360 MG 24 hr capsule Take 1 capsule (360 mg total) by mouth daily.  Marland Kitchen EPINEPHrine (EPIPEN JR) 0.15 MG/0.3ML injection Inject 0.15 mg into the muscle as needed for anaphylaxis.  Marland Kitchen EPIPEN 2-PAK 0.3 MG/0.3ML SOAJ  injection USE AS DIRECTED  . losartan (COZAAR) 100 MG tablet Take 1 tablet (100 mg total) by mouth daily.  . Polyvinyl Alcohol-Povidone (REFRESH OP) Apply to eye.  . spironolactone (ALDACTONE) 50 MG tablet Take 1 tablet (50 mg total) by mouth daily.  . [DISCONTINUED] atorvastatin (LIPITOR) 40 MG tablet Take 1 tablet (40 mg total) by mouth daily.  . [DISCONTINUED] diltiazem (TIAZAC) 360 MG 24 hr capsule Take 1 capsule (360 mg total) by mouth daily.  . [DISCONTINUED] losartan (COZAAR) 100 MG tablet Take 1 tablet (100 mg total) by mouth daily.  . [DISCONTINUED] spironolactone (ALDACTONE) 50 MG tablet Take 1 tablet (50 mg total) by mouth daily.  . [DISCONTINUED] atorvastatin (LIPITOR) 40 MG tablet TAKE 1 TABLET BY MOUTH ONCE A DAY   No facility-administered encounter medications on file as of 12/11/2015.    Review of Systems  Constitutional: Negative for appetite change and unexpected weight change.  HENT: Negative for congestion and sinus pressure.   Respiratory: Negative for cough, chest tightness and shortness of breath.   Cardiovascular: Negative for chest pain, palpitations and leg swelling.  Gastrointestinal: Negative for nausea, vomiting, abdominal pain and diarrhea.  Musculoskeletal: Negative  for back pain.       Previous knee pain and swelling.  Resolved now.    Skin: Negative for color change and rash.  Neurological: Negative for dizziness, light-headedness and headaches.  Psychiatric/Behavioral: Negative for dysphoric mood and agitation.       Objective:    Physical Exam  Constitutional: She appears well-developed and well-nourished. No distress.  HENT:  Nose: Nose normal.  Mouth/Throat: Oropharynx is clear and moist.  Neck: Neck supple. No thyromegaly present.  Cardiovascular: Normal rate and regular rhythm.   Pulmonary/Chest: Breath sounds normal. No respiratory distress. She has no wheezes.  Abdominal: Soft. Bowel sounds are normal. There is no tenderness.    Musculoskeletal: She exhibits no edema or tenderness.  Lymphadenopathy:    She has no cervical adenopathy.  Skin: No rash noted. No erythema.  Psychiatric: She has a normal mood and affect. Her behavior is normal.    BP 110/60 mmHg  Pulse 62  Temp(Src) 97.8 F (36.6 C) (Oral)  Resp 18  Ht 5' 2.75" (1.594 m)  Wt 151 lb 8 oz (68.72 kg)  BMI 27.05 kg/m2  SpO2 98%  LMP 12/21/1976 Wt Readings from Last 3 Encounters:  12/11/15 151 lb 8 oz (68.72 kg)  08/10/15 149 lb 2 oz (67.643 kg)  04/08/15 151 lb 4 oz (68.607 kg)     Lab Results  Component Value Date   WBC 4.9 08/10/2015   HGB 12.0 08/10/2015   HCT 36.4 08/10/2015   PLT 222.0 08/10/2015   GLUCOSE 101* 08/10/2015   CHOL 143 08/10/2015   TRIG 127.0 08/10/2015   HDL 42.00 08/10/2015   LDLCALC 76 08/10/2015   ALT 14 08/10/2015   AST 14 08/10/2015   NA 138 08/10/2015   K 4.3 08/10/2015   CL 102 08/10/2015   CREATININE 1.07 08/10/2015   BUN 14 08/10/2015   CO2 30 08/10/2015   TSH 1.57 08/10/2015    Mm Screening Breast Tomo Bilateral  09/04/2015  CLINICAL DATA:  Screening. EXAM: DIGITAL SCREENING BILATERAL MAMMOGRAM WITH 3D TOMO WITH CAD COMPARISON:  Previous exam(s). ACR Breast Density Category c: The breast tissue is heterogeneously dense, which may obscure small masses. FINDINGS: There are no findings suspicious for malignancy. Images were processed with CAD. IMPRESSION: No mammographic evidence of malignancy. A result letter of this screening mammogram will be mailed directly to the patient. RECOMMENDATION: Screening mammogram in one year. (Code:SM-B-01Y) BI-RADS CATEGORY  1: Negative. Electronically Signed   By: Nolon Nations M.D.   On: 09/04/2015 13:10       Assessment & Plan:   Problem List Items Addressed This Visit    Anemia    Saw GI.  Felt no further w/up warranted.  Follow.        Essential hypertension, benign - Primary    Blood pressure under good control.  Continue same medication regimen.  Follow  pressures.  Follow metabolic panel.        Relevant Medications   atorvastatin (LIPITOR) 40 MG tablet   diltiazem (TIAZAC) 360 MG 24 hr capsule   losartan (COZAAR) 100 MG tablet   spironolactone (ALDACTONE) 50 MG tablet   Other Relevant Orders   Basic metabolic panel   Hypercholesterolemia    On lipitor.  Low cholesterol diet and exercise.  Follow lipid panel and liver function tests.        Relevant Medications   atorvastatin (LIPITOR) 40 MG tablet   diltiazem (TIAZAC) 360 MG 24 hr capsule   losartan (COZAAR) 100  MG tablet   spironolactone (ALDACTONE) 50 MG tablet   Other Relevant Orders   Lipid panel   Hepatic function panel   Osteoarthritis of right knee    Previous knee pain and swelling.  Resolved.  Follow.           Einar Pheasant, MD

## 2015-12-12 ENCOUNTER — Encounter: Payer: Self-pay | Admitting: Internal Medicine

## 2015-12-12 NOTE — Assessment & Plan Note (Signed)
Blood pressure under good control.  Continue same medication regimen.  Follow pressures.  Follow metabolic panel.   

## 2015-12-12 NOTE — Assessment & Plan Note (Signed)
Previous knee pain and swelling.  Resolved.  Follow.

## 2015-12-12 NOTE — Assessment & Plan Note (Signed)
Saw GI.  Felt no further w/up warranted.  Follow.

## 2015-12-12 NOTE — Assessment & Plan Note (Signed)
On lipitor.  Low cholesterol diet and exercise.  Follow lipid panel and liver function tests.   

## 2015-12-25 ENCOUNTER — Other Ambulatory Visit: Payer: Commercial Managed Care - HMO

## 2016-04-01 ENCOUNTER — Other Ambulatory Visit (INDEPENDENT_AMBULATORY_CARE_PROVIDER_SITE_OTHER): Payer: Medicare HMO

## 2016-04-01 DIAGNOSIS — E7800 Pure hypercholesterolemia, unspecified: Secondary | ICD-10-CM | POA: Diagnosis not present

## 2016-04-01 DIAGNOSIS — I10 Essential (primary) hypertension: Secondary | ICD-10-CM | POA: Diagnosis not present

## 2016-04-01 LAB — BASIC METABOLIC PANEL
BUN: 23 mg/dL (ref 6–23)
CALCIUM: 10.1 mg/dL (ref 8.4–10.5)
CO2: 24 meq/L (ref 19–32)
Chloride: 103 mEq/L (ref 96–112)
Creatinine, Ser: 1.28 mg/dL — ABNORMAL HIGH (ref 0.40–1.20)
GFR: 52.59 mL/min — AB (ref >60.00)
Glucose, Bld: 104 mg/dL — ABNORMAL HIGH (ref 70–99)
Potassium: 4.5 mEq/L (ref 3.5–5.1)
SODIUM: 138 meq/L (ref 135–145)

## 2016-04-01 LAB — LIPID PANEL
Cholesterol: 182 mg/dL (ref 0–200)
HDL: 45.4 mg/dL (ref >39.00)
LDL Cholesterol: 103 mg/dL — ABNORMAL HIGH (ref 0–99)
NONHDL: 136.19
TRIGLYCERIDES: 165 mg/dL — AB (ref 0.0–149.0)
Total CHOL/HDL Ratio: 4
VLDL: 33 mg/dL (ref 0.0–40.0)

## 2016-04-01 LAB — HEPATIC FUNCTION PANEL
ALBUMIN: 4.5 g/dL (ref 3.5–5.2)
ALT: 16 U/L (ref 0–35)
AST: 13 U/L (ref 0–37)
Alkaline Phosphatase: 75 U/L (ref 39–117)
Bilirubin, Direct: 0.1 mg/dL (ref 0.0–0.3)
TOTAL PROTEIN: 7.7 g/dL (ref 6.0–8.3)
Total Bilirubin: 0.7 mg/dL (ref 0.2–1.2)

## 2016-04-05 ENCOUNTER — Telehealth: Payer: Self-pay | Admitting: Internal Medicine

## 2016-04-05 DIAGNOSIS — R7989 Other specified abnormal findings of blood chemistry: Secondary | ICD-10-CM

## 2016-04-05 NOTE — Telephone Encounter (Signed)
Order placed for labs.

## 2016-04-05 NOTE — Telephone Encounter (Signed)
Pt called stating she received a call for her to call in and sch a lab appt for this Thursday. Need order please and thank you.   Call pt @ [phone]. Thank you!

## 2016-04-05 NOTE — Telephone Encounter (Signed)
Need orders for BMET to be rechecked per result note. thanks

## 2016-04-07 ENCOUNTER — Other Ambulatory Visit: Payer: Medicare HMO

## 2016-04-13 ENCOUNTER — Other Ambulatory Visit (INDEPENDENT_AMBULATORY_CARE_PROVIDER_SITE_OTHER): Payer: Medicare HMO

## 2016-04-13 DIAGNOSIS — R748 Abnormal levels of other serum enzymes: Secondary | ICD-10-CM | POA: Diagnosis not present

## 2016-04-13 DIAGNOSIS — R7989 Other specified abnormal findings of blood chemistry: Secondary | ICD-10-CM

## 2016-04-13 LAB — URINALYSIS, ROUTINE W REFLEX MICROSCOPIC
BILIRUBIN URINE: NEGATIVE
KETONES UR: NEGATIVE
LEUKOCYTES UA: NEGATIVE
NITRITE: NEGATIVE
Specific Gravity, Urine: 1.015 (ref 1.000–1.030)
Total Protein, Urine: NEGATIVE
URINE GLUCOSE: NEGATIVE
Urobilinogen, UA: 0.2 (ref 0.0–1.0)
pH: 5.5 (ref 5.0–8.0)

## 2016-04-13 LAB — BASIC METABOLIC PANEL
BUN: 25 mg/dL — ABNORMAL HIGH (ref 6–23)
CO2: 26 meq/L (ref 19–32)
Calcium: 9.9 mg/dL (ref 8.4–10.5)
Chloride: 105 mEq/L (ref 96–112)
Creatinine, Ser: 1.52 mg/dL — ABNORMAL HIGH (ref 0.40–1.20)
GFR: 43.12 mL/min — ABNORMAL LOW (ref >60.00)
GLUCOSE: 104 mg/dL — AB (ref 70–99)
POTASSIUM: 4.6 meq/L (ref 3.5–5.1)
SODIUM: 138 meq/L (ref 135–145)

## 2016-04-15 ENCOUNTER — Encounter: Payer: Self-pay | Admitting: Internal Medicine

## 2016-04-15 ENCOUNTER — Ambulatory Visit (INDEPENDENT_AMBULATORY_CARE_PROVIDER_SITE_OTHER): Payer: Medicare HMO | Admitting: Internal Medicine

## 2016-04-15 VITALS — BP 118/69 | HR 65 | Temp 97.9°F | Resp 18 | Ht 63.0 in | Wt 150.5 lb

## 2016-04-15 DIAGNOSIS — Z1239 Encounter for other screening for malignant neoplasm of breast: Secondary | ICD-10-CM

## 2016-04-15 DIAGNOSIS — I10 Essential (primary) hypertension: Secondary | ICD-10-CM | POA: Diagnosis not present

## 2016-04-15 DIAGNOSIS — Z0000 Encounter for general adult medical examination without abnormal findings: Secondary | ICD-10-CM | POA: Diagnosis not present

## 2016-04-15 DIAGNOSIS — R748 Abnormal levels of other serum enzymes: Secondary | ICD-10-CM

## 2016-04-15 DIAGNOSIS — R7989 Other specified abnormal findings of blood chemistry: Secondary | ICD-10-CM

## 2016-04-15 DIAGNOSIS — D649 Anemia, unspecified: Secondary | ICD-10-CM

## 2016-04-15 DIAGNOSIS — N183 Chronic kidney disease, stage 3 unspecified: Secondary | ICD-10-CM

## 2016-04-15 DIAGNOSIS — E7800 Pure hypercholesterolemia, unspecified: Secondary | ICD-10-CM

## 2016-04-15 LAB — BASIC METABOLIC PANEL
BUN: 21 mg/dL (ref 6–23)
CHLORIDE: 104 meq/L (ref 96–112)
CO2: 25 mEq/L (ref 19–32)
Calcium: 9.8 mg/dL (ref 8.4–10.5)
Creatinine, Ser: 1.26 mg/dL — ABNORMAL HIGH (ref 0.40–1.20)
GFR: 53.54 mL/min — ABNORMAL LOW (ref >60.00)
Glucose, Bld: 82 mg/dL (ref 70–99)
POTASSIUM: 4.3 meq/L (ref 3.5–5.1)
Sodium: 136 mEq/L (ref 135–145)

## 2016-04-15 MED ORDER — EPINEPHRINE 0.3 MG/0.3ML IJ SOAJ: INTRAMUSCULAR | Status: DC

## 2016-04-15 NOTE — Patient Instructions (Signed)
Remain off the spironolactone (aldactone)

## 2016-04-15 NOTE — Progress Notes (Signed)
Patient ID: Christina Cole, female   DOB: 04/14/1941, 75 y.o.   MRN: [id number]   Subjective:    Patient ID: Christina Cole, female    DOB: Feb 01, 1941, 75 y.o.   MRN: [id number]  HPI  Patient here for a physical exam.  She states she is doing well.  Feels good.  Tries to stay active.  No cardiac symptoms with increased activity or exertion.  No sob.  No acid reflux reported.  No abdominal pain or cramping.  Bowels stable.     Past Medical History  Diagnosis Date  . Hypertension   . Hyperlipidemia   . Thyroid disease   . Heart murmur    Past Surgical History  Procedure Laterality Date  . Appendectomy  1978  . Abdominal hysterectomy  1978    ovaries left in place  . Tubal ligation     Family History  Problem Relation Age of Onset  . Stroke Mother   . Hypertension Mother   . Arthritis Mother   . Cancer Sister     Breast  . Breast cancer Sister 8  . Cancer Sister     Breast  . Breast cancer Sister 58  . Lung cancer Brother   . Diabetes Brother    Social History   Social History  . Marital Status: Single    Spouse Name: N/A  . Number of Children: 3  . Years of Education: N/A   Social History Main Topics  . Smoking status: Never Smoker   . Smokeless tobacco: Never Used  . Alcohol Use: No  . Drug Use: No  . Sexual Activity: Not Asked   Other Topics Concern  . None   Social History Narrative    Outpatient Encounter Prescriptions as of 04/15/2016  Medication Sig  . atorvastatin (LIPITOR) 40 MG tablet Take 1 tablet (40 mg total) by mouth daily.  Marland Kitchen diltiazem (TIAZAC) 360 MG 24 hr capsule Take 1 capsule (360 mg total) by mouth daily.  Marland Kitchen EPINEPHrine (EPIPEN 2-PAK) 0.3 mg/0.3 mL IJ SOAJ injection USE AS DIRECTED  . EPINEPHrine (EPIPEN JR) 0.15 MG/0.3ML injection Inject 0.15 mg into the muscle as needed for anaphylaxis.  Marland Kitchen losartan (COZAAR) 100 MG tablet Take 1 tablet (100 mg total) by mouth daily.  . Polyvinyl Alcohol-Povidone (REFRESH OP) Apply to eye.  .  spironolactone (ALDACTONE) 50 MG tablet Take 1 tablet (50 mg total) by mouth daily.  . [DISCONTINUED] EPIPEN 2-PAK 0.3 MG/0.3ML SOAJ injection USE AS DIRECTED   No facility-administered encounter medications on file as of 04/15/2016.    Review of Systems  Constitutional: Negative for appetite change and unexpected weight change.  HENT: Negative for congestion and sinus pressure.   Eyes: Negative for pain and visual disturbance.  Respiratory: Negative for cough, chest tightness and shortness of breath.   Cardiovascular: Negative for chest pain, palpitations and leg swelling.  Gastrointestinal: Negative for nausea, vomiting, abdominal pain and diarrhea.  Genitourinary: Negative for dysuria and difficulty urinating.  Musculoskeletal: Negative for back pain and joint swelling.  Skin: Negative for color change and rash.  Neurological: Negative for dizziness, light-headedness and headaches.  Hematological: Negative for adenopathy. Does not bruise/bleed easily.  Psychiatric/Behavioral: Negative for dysphoric mood and agitation.       Objective:    Physical Exam  Constitutional: She is oriented to person, place, and time. She appears well-developed and well-nourished. No distress.  HENT:  Nose: Nose normal.  Mouth/Throat: Oropharynx is clear and moist.  Eyes: Right eye exhibits  no discharge. Left eye exhibits no discharge. No scleral icterus.  Neck: Neck supple. No thyromegaly present.  Cardiovascular: Normal rate and regular rhythm.   Pulmonary/Chest: Breath sounds normal. No accessory muscle usage. No tachypnea. No respiratory distress. She has no decreased breath sounds. She has no wheezes. She has no rhonchi. Right breast exhibits no inverted nipple, no mass, no nipple discharge and no tenderness (no axillary adenopathy). Left breast exhibits no inverted nipple, no mass, no nipple discharge and no tenderness (no axilarry adenopathy).  Abdominal: Soft. Bowel sounds are normal. There is no  tenderness.  Musculoskeletal: She exhibits no edema or tenderness.  Lymphadenopathy:    She has no cervical adenopathy.  Neurological: She is alert and oriented to person, place, and time.  Skin: Skin is warm. No rash noted. No erythema.  Psychiatric: She has a normal mood and affect. Her behavior is normal.    BP 118/69 mmHg  Pulse 65  Temp(Src) 97.9 F (36.6 C) (Oral)  Resp 18  Ht 5\' 3"  (1.6 m)  Wt 150 lb 8 oz (68.266 kg)  BMI 26.67 kg/m2  SpO2 95%  LMP 12/21/1976 Wt Readings from Last 3 Encounters:  04/15/16 150 lb 8 oz (68.266 kg)  12/11/15 151 lb 8 oz (68.72 kg)  08/10/15 149 lb 2 oz (67.643 kg)     Lab Results  Component Value Date   WBC 4.9 08/10/2015   HGB 12.0 08/10/2015   HCT 36.4 08/10/2015   PLT 222.0 08/10/2015   GLUCOSE 82 04/15/2016   CHOL 182 04/01/2016   TRIG 165.0* 04/01/2016   HDL 45.40 04/01/2016   LDLCALC 103* 04/01/2016   ALT 16 04/01/2016   AST 13 04/01/2016   NA 136 04/15/2016   K 4.3 04/15/2016   CL 104 04/15/2016   CREATININE 1.26* 04/15/2016   BUN 21 04/15/2016   CO2 25 04/15/2016   TSH 1.57 08/10/2015    Mm Screening Breast Tomo Bilateral  09/04/2015  CLINICAL DATA:  Screening. EXAM: DIGITAL SCREENING BILATERAL MAMMOGRAM WITH 3D TOMO WITH CAD COMPARISON:  Previous exam(s). ACR Breast Density Category c: The breast tissue is heterogeneously dense, which may obscure small masses. FINDINGS: There are no findings suspicious for malignancy. Images were processed with CAD. IMPRESSION: No mammographic evidence of malignancy. A result letter of this screening mammogram will be mailed directly to the patient. RECOMMENDATION: Screening mammogram in one year. (Code:SM-B-01Y) BI-RADS CATEGORY  1: Negative. Electronically Signed   By: Nolon Nations M.D.   On: 09/04/2015 13:10       Assessment & Plan:   Problem List Items Addressed This Visit    Anemia    Saw GI.  Felt no further w/up warranted.  Follow cbc.       Chronic kidney disease  (CKD), stage III (moderate)    Recent creatinine increased - 1.5.  Off aldactone.   Stay hydrated.  Follow pressures.  Recheck metabolic panel.        Essential hypertension, benign    Blood pressure has been well controlled.  Recent elevated creatinine.  Off aldactone.  Stay hydrated.  Recheck metabolic panel.  Follow pressures.        Relevant Medications   EPINEPHrine (EPIPEN 2-PAK) 0.3 mg/0.3 mL IJ SOAJ injection   Health care maintenance    Physical today 04/15/16.  Colonoscopy 2010.  Mammogram 09/04/15 - Birads I.      Hypercholesterolemia    On lipitor.  Low cholesterol diet and exercise.  Follow lipid panel and liver function  tests.        Relevant Medications   EPINEPHrine (EPIPEN 2-PAK) 0.3 mg/0.3 mL IJ SOAJ injection    Other Visit Diagnoses    Elevated serum creatinine    -  Primary    Relevant Orders    Basic metabolic panel (Completed)    Screening breast examination        Relevant Orders    MM DIGITAL SCREENING BILATERAL        Einar Pheasant, MD

## 2016-04-15 NOTE — Progress Notes (Signed)
Pre-visit discussion using our clinic review tool. No additional management support is needed unless otherwise documented below in the visit note.  

## 2016-04-15 NOTE — Assessment & Plan Note (Signed)
Physical today 04/15/16.  Colonoscopy 2010.  Mammogram 09/04/15 - Birads I.

## 2016-04-16 ENCOUNTER — Other Ambulatory Visit: Payer: Self-pay | Admitting: Internal Medicine

## 2016-04-16 DIAGNOSIS — R7989 Other specified abnormal findings of blood chemistry: Secondary | ICD-10-CM

## 2016-04-16 NOTE — Progress Notes (Signed)
Order placed for f/u met b 

## 2016-04-19 ENCOUNTER — Encounter: Payer: Self-pay | Admitting: *Deleted

## 2016-04-21 ENCOUNTER — Encounter: Payer: Self-pay | Admitting: Internal Medicine

## 2016-04-21 NOTE — Assessment & Plan Note (Signed)
On lipitor.  Low cholesterol diet and exercise.  Follow lipid panel and liver function tests.   

## 2016-04-21 NOTE — Assessment & Plan Note (Signed)
Blood pressure has been well controlled.  Recent elevated creatinine.  Off aldactone.  Stay hydrated.  Recheck metabolic panel.  Follow pressures.

## 2016-04-21 NOTE — Assessment & Plan Note (Signed)
Recent creatinine increased - 1.5.  Off aldactone.   Stay hydrated.  Follow pressures.  Recheck metabolic panel.

## 2016-04-21 NOTE — Assessment & Plan Note (Addendum)
Saw GI.  Felt no further w/up warranted.  Follow cbc.  

## 2016-05-05 ENCOUNTER — Other Ambulatory Visit: Payer: Medicare HMO

## 2016-05-05 DIAGNOSIS — R7989 Other specified abnormal findings of blood chemistry: Secondary | ICD-10-CM

## 2016-05-05 NOTE — Addendum Note (Signed)
Addended by: Frutoso Chase A on: 05/05/2016 10:18 AM   Modules accepted: Orders

## 2016-05-10 ENCOUNTER — Other Ambulatory Visit: Payer: Self-pay

## 2016-05-10 DIAGNOSIS — R7989 Other specified abnormal findings of blood chemistry: Secondary | ICD-10-CM

## 2016-05-11 ENCOUNTER — Other Ambulatory Visit (INDEPENDENT_AMBULATORY_CARE_PROVIDER_SITE_OTHER): Payer: Medicare HMO

## 2016-05-11 DIAGNOSIS — R7989 Other specified abnormal findings of blood chemistry: Secondary | ICD-10-CM

## 2016-05-11 DIAGNOSIS — R748 Abnormal levels of other serum enzymes: Secondary | ICD-10-CM

## 2016-05-11 LAB — BASIC METABOLIC PANEL
BUN: 11 mg/dL (ref 6–23)
CALCIUM: 9.7 mg/dL (ref 8.4–10.5)
CO2: 26 meq/L (ref 19–32)
Chloride: 105 mEq/L (ref 96–112)
Creatinine, Ser: 0.89 mg/dL (ref 0.40–1.20)
GFR: 79.96 mL/min (ref >60.00)
GLUCOSE: 94 mg/dL (ref 70–99)
Potassium: 3.8 mEq/L (ref 3.5–5.1)
SODIUM: 138 meq/L (ref 135–145)

## 2016-05-12 ENCOUNTER — Encounter: Payer: Self-pay | Admitting: *Deleted

## 2016-05-24 ENCOUNTER — Ambulatory Visit: Payer: Medicare HMO | Admitting: Internal Medicine

## 2016-05-26 ENCOUNTER — Encounter: Payer: Self-pay | Admitting: Internal Medicine

## 2016-05-26 ENCOUNTER — Ambulatory Visit (INDEPENDENT_AMBULATORY_CARE_PROVIDER_SITE_OTHER): Payer: Medicare HMO | Admitting: Internal Medicine

## 2016-05-26 DIAGNOSIS — I10 Essential (primary) hypertension: Secondary | ICD-10-CM

## 2016-05-26 DIAGNOSIS — E7800 Pure hypercholesterolemia, unspecified: Secondary | ICD-10-CM | POA: Diagnosis not present

## 2016-05-26 NOTE — Progress Notes (Signed)
Patient ID: Christina Cole, female   DOB: 09-21-1941, 75 y.o.   MRN: [id number]   Subjective:    Patient ID: Christina Cole, female    DOB: 11/01/1940, 75 y.o.   MRN: [id number]  HPI  Patient here for a scheduled follow up.  She is accompanied by her daughter.  History obtained mostly from the patient.  She is doing well.  Feels good.  Recently had elevated creatinine.  Instructed her to stop her aldactone.  Renal function back to normal.  Blood pressure doing well.  Outside checks averaging 120s/60s.  No chest pain.  No sob.  No acid reflux.  No abdominal pain or cramping.  Bowels stable.     Past Medical History:  Diagnosis Date  . Heart murmur   . Hyperlipidemia   . Hypertension   . Thyroid disease    Past Surgical History:  Procedure Laterality Date  . ABDOMINAL HYSTERECTOMY  1978   ovaries left in place  . APPENDECTOMY  1978  . TUBAL LIGATION     Family History  Problem Relation Age of Onset  . Stroke Mother   . Hypertension Mother   . Arthritis Mother   . Cancer Sister     Breast  . Breast cancer Sister 39  . Cancer Sister     Breast  . Breast cancer Sister 80  . Lung cancer Brother   . Diabetes Brother    Social History   Social History  . Marital status: Single    Spouse name: N/A  . Number of children: 3  . Years of education: N/A   Social History Main Topics  . Smoking status: Never Smoker  . Smokeless tobacco: Never Used  . Alcohol use No  . Drug use: No  . Sexual activity: Not Asked   Other Topics Concern  . None   Social History Narrative  . None    Outpatient Encounter Prescriptions as of 05/26/2016  Medication Sig  . atorvastatin (LIPITOR) 40 MG tablet Take 1 tablet (40 mg total) by mouth daily.  Marland Kitchen diltiazem (TIAZAC) 360 MG 24 hr capsule Take 1 capsule (360 mg total) by mouth daily.  Marland Kitchen EPINEPHrine (EPIPEN 2-PAK) 0.3 mg/0.3 mL IJ SOAJ injection USE AS DIRECTED  . EPINEPHrine (EPIPEN JR) 0.15 MG/0.3ML injection Inject 0.15 mg into the  muscle as needed for anaphylaxis.  Marland Kitchen losartan (COZAAR) 100 MG tablet Take 1 tablet (100 mg total) by mouth daily.  . Polyvinyl Alcohol-Povidone (REFRESH OP) Apply to eye.  . [DISCONTINUED] spironolactone (ALDACTONE) 50 MG tablet Take 1 tablet (50 mg total) by mouth daily. (Patient not taking: Reported on 05/26/2016)   No facility-administered encounter medications on file as of 05/26/2016.     Review of Systems  Constitutional: Negative for appetite change and unexpected weight change.  HENT: Negative for congestion and sinus pressure.   Respiratory: Negative for cough, chest tightness and shortness of breath.   Cardiovascular: Negative for chest pain, palpitations and leg swelling.  Gastrointestinal: Negative for abdominal pain, diarrhea, nausea and vomiting.  Genitourinary: Negative for difficulty urinating and dysuria.  Musculoskeletal: Negative for back pain and joint swelling.  Skin: Negative for color change and rash.  Neurological: Negative for dizziness, light-headedness and headaches.  Psychiatric/Behavioral: Negative for agitation and dysphoric mood.       Objective:    Physical Exam  Constitutional: She appears well-developed and well-nourished. No distress.  HENT:  Nose: Nose normal.  Mouth/Throat: Oropharynx is clear and moist.  Neck: Neck  supple. No thyromegaly present.  Cardiovascular: Normal rate and regular rhythm.   Pulmonary/Chest: Breath sounds normal. No respiratory distress. She has no wheezes.  Abdominal: Soft. Bowel sounds are normal. There is no tenderness.  Musculoskeletal: She exhibits no edema or tenderness.  Lymphadenopathy:    She has no cervical adenopathy.  Skin: No rash noted. No erythema.  Psychiatric: She has a normal mood and affect. Her behavior is normal.    BP 120/60 (BP Location: Right Arm, Patient Position: Sitting, Cuff Size: Normal)   Pulse 75   Temp 98.2 F (36.8 C) (Oral)   Resp 17   Ht 5\' 3"  (1.6 m)   Wt 152 lb 8 oz (69.2 kg)    LMP 12/21/1976   SpO2 94%   BMI 27.01 kg/m  Wt Readings from Last 3 Encounters:  05/26/16 152 lb 8 oz (69.2 kg)  04/15/16 150 lb 8 oz (68.3 kg)  12/11/15 151 lb 8 oz (68.7 kg)     Lab Results  Component Value Date   WBC 4.9 08/10/2015   HGB 12.0 08/10/2015   HCT 36.4 08/10/2015   PLT 222.0 08/10/2015   GLUCOSE 94 05/11/2016   CHOL 182 04/01/2016   TRIG 165.0 (H) 04/01/2016   HDL 45.40 04/01/2016   LDLCALC 103 (H) 04/01/2016   ALT 16 04/01/2016   AST 13 04/01/2016   NA 138 05/11/2016   K 3.8 05/11/2016   CL 105 05/11/2016   CREATININE 0.89 05/11/2016   BUN 11 05/11/2016   CO2 26 05/11/2016   TSH 1.57 08/10/2015    Mm Screening Breast Tomo Bilateral  Result Date: 09/04/2015 CLINICAL DATA:  Screening. EXAM: DIGITAL SCREENING BILATERAL MAMMOGRAM WITH 3D TOMO WITH CAD COMPARISON:  Previous exam(s). ACR Breast Density Category c: The breast tissue is heterogeneously dense, which may obscure small masses. FINDINGS: There are no findings suspicious for malignancy. Images were processed with CAD. IMPRESSION: No mammographic evidence of malignancy. A result letter of this screening mammogram will be mailed directly to the patient. RECOMMENDATION: Screening mammogram in one year. (Code:SM-B-01Y) BI-RADS CATEGORY  1: Negative. Electronically Signed   By: Nolon Nations M.D.   On: 09/04/2015 13:10       Assessment & Plan:   Problem List Items Addressed This Visit    Essential hypertension, benign    Blood pressure under good control.  Is not taking spironolactone. Remain off.  Continue same medication regimen currently taking.  Follow pressures.  Follow metabolic panel.        Relevant Orders   CBC with Differential/Platelet   TSH   Basic metabolic panel   Hypercholesterolemia    On lipitor.  Low cholesterol diet and exercise.  Follow lipid panel and liver function tests.        Relevant Orders   Lipid panel   Hepatic function panel    Other Visit Diagnoses   None.       Einar Pheasant, MD

## 2016-05-26 NOTE — Progress Notes (Signed)
Pre-visit discussion using our clinic review tool. No additional management support is needed unless otherwise documented below in the visit note.  

## 2016-05-28 ENCOUNTER — Encounter: Payer: Self-pay | Admitting: Internal Medicine

## 2016-05-28 NOTE — Assessment & Plan Note (Signed)
On lipitor.  Low cholesterol diet and exercise.  Follow lipid panel and liver function tests.   

## 2016-05-28 NOTE — Assessment & Plan Note (Addendum)
Blood pressure under good control.  Is not taking spironolactone. Remain off.  Continue same medication regimen currently taking.  Follow pressures.  Follow metabolic panel.

## 2016-08-24 ENCOUNTER — Other Ambulatory Visit (INDEPENDENT_AMBULATORY_CARE_PROVIDER_SITE_OTHER): Payer: Medicare HMO

## 2016-08-24 DIAGNOSIS — I10 Essential (primary) hypertension: Secondary | ICD-10-CM | POA: Diagnosis not present

## 2016-08-24 DIAGNOSIS — E7800 Pure hypercholesterolemia, unspecified: Secondary | ICD-10-CM

## 2016-08-24 LAB — CBC WITH DIFFERENTIAL/PLATELET
Basophils Absolute: 0 10*3/uL (ref 0.0–0.1)
Basophils Relative: 0.5 % (ref 0.0–3.0)
EOS PCT: 4 % (ref 0.0–5.0)
Eosinophils Absolute: 0.2 10*3/uL (ref 0.0–0.7)
HCT: 40.1 % (ref 36.0–46.0)
Hemoglobin: 13.2 g/dL (ref 12.0–15.0)
LYMPHS ABS: 2.4 10*3/uL (ref 0.7–4.0)
Lymphocytes Relative: 44.6 % (ref 12.0–46.0)
MCHC: 32.9 g/dL (ref 30.0–36.0)
MCV: 85.9 fl (ref 78.0–100.0)
MONO ABS: 0.4 10*3/uL (ref 0.1–1.0)
MONOS PCT: 7.8 % (ref 3.0–12.0)
NEUTROS ABS: 2.3 10*3/uL (ref 1.4–7.7)
NEUTROS PCT: 43.1 % (ref 43.0–77.0)
PLATELETS: 235 10*3/uL (ref 150.0–400.0)
RBC: 4.67 Mil/uL (ref 3.87–5.11)
RDW: 14.7 % (ref 11.5–15.5)
WBC: 5.4 10*3/uL (ref 4.0–10.5)

## 2016-08-24 LAB — HEPATIC FUNCTION PANEL
ALBUMIN: 4.5 g/dL (ref 3.5–5.2)
ALK PHOS: 71 U/L (ref 39–117)
ALT: 26 U/L (ref 0–35)
AST: 20 U/L (ref 0–37)
BILIRUBIN DIRECT: 0.1 mg/dL (ref 0.0–0.3)
BILIRUBIN TOTAL: 0.8 mg/dL (ref 0.2–1.2)
Total Protein: 8.2 g/dL (ref 6.0–8.3)

## 2016-08-24 LAB — LIPID PANEL
Cholesterol: 183 mg/dL (ref 0–200)
HDL: 53.4 mg/dL (ref >39.00)
LDL Cholesterol: 94 mg/dL (ref 0–99)
NONHDL: 129.68
Total CHOL/HDL Ratio: 3
Triglycerides: 176 mg/dL — ABNORMAL HIGH (ref 0.0–149.0)
VLDL: 35.2 mg/dL (ref 0.0–40.0)

## 2016-08-24 LAB — BASIC METABOLIC PANEL
BUN: 13 mg/dL (ref 6–23)
CHLORIDE: 103 meq/L (ref 96–112)
CO2: 29 meq/L (ref 19–32)
CREATININE: 0.96 mg/dL (ref 0.40–1.20)
Calcium: 10.5 mg/dL (ref 8.4–10.5)
GFR: 73.21 mL/min (ref >60.00)
GLUCOSE: 102 mg/dL — AB (ref 70–99)
Potassium: 5.2 mEq/L — ABNORMAL HIGH (ref 3.5–5.1)
Sodium: 139 mEq/L (ref 135–145)

## 2016-08-24 LAB — TSH: TSH: 1.61 u[IU]/mL (ref 0.35–4.50)

## 2016-08-26 ENCOUNTER — Encounter: Payer: Self-pay | Admitting: Internal Medicine

## 2016-08-26 ENCOUNTER — Ambulatory Visit (INDEPENDENT_AMBULATORY_CARE_PROVIDER_SITE_OTHER): Payer: Medicare HMO | Admitting: Internal Medicine

## 2016-08-26 VITALS — BP 112/60 | HR 62 | Temp 98.6°F | Ht 63.0 in | Wt 155.0 lb

## 2016-08-26 DIAGNOSIS — E875 Hyperkalemia: Secondary | ICD-10-CM | POA: Diagnosis not present

## 2016-08-26 DIAGNOSIS — I10 Essential (primary) hypertension: Secondary | ICD-10-CM | POA: Diagnosis not present

## 2016-08-26 DIAGNOSIS — E7800 Pure hypercholesterolemia, unspecified: Secondary | ICD-10-CM

## 2016-08-26 LAB — POTASSIUM: POTASSIUM: 4.2 mmol/L (ref 3.5–5.3)

## 2016-08-26 MED ORDER — DILTIAZEM HCL ER BEADS 360 MG PO CP24: 360.0000 mg | ORAL_CAPSULE | Freq: Every day | ORAL | 3 refills | Status: DC

## 2016-08-26 MED ORDER — LOSARTAN POTASSIUM 100 MG PO TABS: 100.0000 mg | ORAL_TABLET | Freq: Every day | ORAL | 3 refills | Status: DC

## 2016-08-26 MED ORDER — ATORVASTATIN CALCIUM 40 MG PO TABS: 40.0000 mg | ORAL_TABLET | Freq: Every day | ORAL | 3 refills | Status: DC

## 2016-08-26 NOTE — Progress Notes (Signed)
Patient ID: Christina Cole, female   DOB: Oct 12, 1941, 75 y.o.   MRN: [id number]   Subjective:    Patient ID: Christina Cole, female    DOB: [birth year], 75 y.o.   MRN: [id number]  HPI  Patient here for a scheduled follow up.  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  Feels good.  No chest pain.  No sob.  No acid reflux.  No abdominal pain or cramping.  Bowels stable.  Overall feels good.     Past Medical History:  Diagnosis Date  . Heart murmur   . Hyperlipidemia   . Hypertension   . Thyroid disease    Past Surgical History:  Procedure Laterality Date  . ABDOMINAL HYSTERECTOMY  1978   ovaries left in place  . APPENDECTOMY  1978  . TUBAL LIGATION     Family History  Problem Relation Age of Onset  . Stroke Mother   . Hypertension Mother   . Arthritis Mother   . Cancer Sister     Breast  . Breast cancer Sister 34  . Cancer Sister     Breast  . Breast cancer Sister 43  . Lung cancer Brother   . Diabetes Brother    Social History   Social History  . Marital status: Single    Spouse name: N/A  . Number of children: 3  . Years of education: N/A   Social History Main Topics  . Smoking status: Never Smoker  . Smokeless tobacco: Never Used  . Alcohol use No  . Drug use: No  . Sexual activity: Not Asked   Other Topics Concern  . None   Social History Narrative  . None    Outpatient Encounter Prescriptions as of 08/26/2016  Medication Sig  . EPINEPHrine (EPIPEN 2-PAK) 0.3 mg/0.3 mL IJ SOAJ injection USE AS DIRECTED  . EPINEPHrine (EPIPEN JR) 0.15 MG/0.3ML injection Inject 0.15 mg into the muscle as needed for anaphylaxis.  . Polyvinyl Alcohol-Povidone (REFRESH OP) Apply to eye.  . [DISCONTINUED] atorvastatin (LIPITOR) 40 MG tablet Take 1 tablet (40 mg total) by mouth daily.  . [DISCONTINUED] diltiazem (TIAZAC) 360 MG 24 hr capsule Take 1 capsule (360 mg total) by mouth daily.  . [DISCONTINUED] losartan (COZAAR) 100 MG tablet Take 1 tablet (100 mg total) by mouth daily.  Marland Kitchen  atorvastatin (LIPITOR) 40 MG tablet Take 1 tablet (40 mg total) by mouth daily.  Marland Kitchen diltiazem (TIAZAC) 360 MG 24 hr capsule Take 1 capsule (360 mg total) by mouth daily.  Marland Kitchen losartan (COZAAR) 100 MG tablet Take 1 tablet (100 mg total) by mouth daily.   No facility-administered encounter medications on file as of 08/26/2016.     Review of Systems  Constitutional: Negative for appetite change and unexpected weight change.  HENT: Negative for congestion and sinus pressure.   Respiratory: Negative for cough, chest tightness and shortness of breath.   Cardiovascular: Negative for chest pain, palpitations and leg swelling.  Gastrointestinal: Negative for abdominal pain, diarrhea, nausea and vomiting.  Genitourinary: Negative for difficulty urinating and dysuria.  Musculoskeletal: Negative for back pain and joint swelling.  Neurological: Negative for dizziness, light-headedness and headaches.  Psychiatric/Behavioral: Negative for agitation and dysphoric mood.       Objective:    Physical Exam  Constitutional: She appears well-developed and well-nourished. No distress.  HENT:  Nose: Nose normal.  Mouth/Throat: Oropharynx is clear and moist.  Neck: Neck supple. No thyromegaly present.  Cardiovascular: Normal rate and regular rhythm.   Pulmonary/Chest: Breath  sounds normal. No respiratory distress. She has no wheezes.  Abdominal: Soft. Bowel sounds are normal. There is no tenderness.  Musculoskeletal: She exhibits no edema or tenderness.  Lymphadenopathy:    She has no cervical adenopathy.  Skin: No rash noted. No erythema.  Psychiatric: She has a normal mood and affect. Her behavior is normal.    BP 112/60   Pulse 62   Temp 98.6 F (37 C) (Oral)   Ht 5\' 3"  (1.6 m)   Wt 155 lb (70.3 kg)   LMP 12/21/1976   SpO2 95%   BMI 27.46 kg/m  Wt Readings from Last 3 Encounters:  08/26/16 155 lb (70.3 kg)  05/26/16 152 lb 8 oz (69.2 kg)  04/15/16 150 lb 8 oz (68.3 kg)     Lab Results    Component Value Date   WBC 5.4 08/24/2016   HGB 13.2 08/24/2016   HCT 40.1 08/24/2016   PLT 235.0 08/24/2016   GLUCOSE 102 (H) 08/24/2016   CHOL 183 08/24/2016   TRIG 176.0 (H) 08/24/2016   HDL 53.40 08/24/2016   LDLCALC 94 08/24/2016   ALT 26 08/24/2016   AST 20 08/24/2016   NA 139 08/24/2016   K 4.2 08/26/2016   CL 103 08/24/2016   CREATININE 0.96 08/24/2016   BUN 13 08/24/2016   CO2 29 08/24/2016   TSH 1.61 08/24/2016    Mm Screening Breast Tomo Bilateral  Result Date: 09/04/2015 CLINICAL DATA:  Screening. EXAM: DIGITAL SCREENING BILATERAL MAMMOGRAM WITH 3D TOMO WITH CAD COMPARISON:  Previous exam(s). ACR Breast Density Category c: The breast tissue is heterogeneously dense, which may obscure small masses. FINDINGS: There are no findings suspicious for malignancy. Images were processed with CAD. IMPRESSION: No mammographic evidence of malignancy. A result letter of this screening mammogram will be mailed directly to the patient. RECOMMENDATION: Screening mammogram in one year. (Code:SM-B-01Y) BI-RADS CATEGORY  1: Negative. Electronically Signed   By: Nolon Nations M.D.   On: 09/04/2015 13:10       Assessment & Plan:   Problem List Items Addressed This Visit    Essential hypertension, benign    Blood pressure under good control.  Continue same medication regimen.  Follow pressures.  Follow metabolic panel.        Relevant Medications   atorvastatin (LIPITOR) 40 MG tablet   diltiazem (TIAZAC) 360 MG 24 hr capsule   losartan (COZAAR) 100 MG tablet   Other Relevant Orders   Basic metabolic panel   Hypercholesterolemia    On lipitor.  Low cholesterol diet and exercise.  Follow lipid panel and liver function tests.       Relevant Medications   atorvastatin (LIPITOR) 40 MG tablet   diltiazem (TIAZAC) 360 MG 24 hr capsule   losartan (COZAAR) 100 MG tablet   Other Relevant Orders   Hepatic function panel   Lipid panel    Other Visit Diagnoses    Hyperkalemia    -   Primary   recheck potassium.    Relevant Orders   Potassium (Completed)       Einar Pheasant, MD'

## 2016-08-26 NOTE — Progress Notes (Signed)
Pre visit review using our clinic review tool, if applicable. No additional management support is needed unless otherwise documented below in the visit note. 

## 2016-08-28 ENCOUNTER — Encounter: Payer: Self-pay | Admitting: Internal Medicine

## 2016-08-28 NOTE — Assessment & Plan Note (Signed)
On lipitor.  Low cholesterol diet and exercise.  Follow lipid panel and liver function tests.   

## 2016-08-28 NOTE — Assessment & Plan Note (Signed)
Blood pressure under good control.  Continue same medication regimen.  Follow pressures.  Follow metabolic panel.   

## 2016-08-29 ENCOUNTER — Encounter: Payer: Self-pay | Admitting: Internal Medicine

## 2016-09-05 ENCOUNTER — Ambulatory Visit
Admission: RE | Admit: 2016-09-05 | Discharge: 2016-09-05 | Disposition: A | Discharge status: Home / Self Care | Payer: Medicare HMO | Source: Ambulatory Visit | Attending: Internal Medicine | Admitting: Internal Medicine

## 2016-09-05 ENCOUNTER — Other Ambulatory Visit: Payer: Self-pay | Admitting: Internal Medicine

## 2016-09-05 DIAGNOSIS — Z1239 Encounter for other screening for malignant neoplasm of breast: Secondary | ICD-10-CM

## 2016-09-05 DIAGNOSIS — Z1231 Encounter for screening mammogram for malignant neoplasm of breast: Secondary | ICD-10-CM | POA: Diagnosis present

## 2016-11-09 ENCOUNTER — Telehealth: Payer: Self-pay | Admitting: *Deleted

## 2016-11-09 NOTE — Telephone Encounter (Signed)
Patient advised of below and verbalized and understanding . She will call me back and let me know if she will let me know about Friday appt.

## 2016-11-09 NOTE — Telephone Encounter (Signed)
Patient reported that she has nor fever of cough,however she has had congestion for a week. She has clear drainage . She requested a suggestion from Dr. Nicki Reaper of a medication that she can take of a Rx called into the pharmacy [facility] contact [phone]

## 2016-11-09 NOTE — Telephone Encounter (Signed)
She can try saline nasal spray - flush nose at least 2-3x/day and nasacort nasal spray - 2 sprays each nostril one time per day.  Do this in the evening.  If no improvement, I can see her Friday 12:15 for work in for this problem.

## 2016-11-09 NOTE — Telephone Encounter (Signed)
Reason for call: congestion,   Symptoms:head congestion, no fever, nasal drainage clear, hoarse voice , started with cough not now  Duration 1 week no shorntess of breath.   Medications: used vaporizer, tried Coricidin , what would you suggest.  I scheduled appointment with Dr Lacinda Axon tomorrow at 1:15pm .

## 2016-11-10 ENCOUNTER — Ambulatory Visit: Payer: Medicare HMO | Admitting: Family Medicine

## 2016-11-10 NOTE — Telephone Encounter (Signed)
Ok.  Tell her to keep [place] posted.  If needs anything, let us know.

## 2016-11-10 NOTE — Telephone Encounter (Signed)
Pt returned call, and notified of Dr. Nicki Reaper last statement

## 2016-11-10 NOTE — Telephone Encounter (Signed)
Pt called back to say that she is feeling better with the OTC medication that was recommended.

## 2016-11-10 NOTE — Telephone Encounter (Signed)
Left message to call.

## 2016-12-28 ENCOUNTER — Other Ambulatory Visit (INDEPENDENT_AMBULATORY_CARE_PROVIDER_SITE_OTHER): Payer: Medicare HMO

## 2016-12-28 DIAGNOSIS — E7800 Pure hypercholesterolemia, unspecified: Secondary | ICD-10-CM

## 2016-12-28 DIAGNOSIS — I10 Essential (primary) hypertension: Secondary | ICD-10-CM | POA: Diagnosis not present

## 2016-12-28 LAB — LIPID PANEL
CHOL/HDL RATIO: 4
CHOLESTEROL: 175 mg/dL (ref 0–200)
HDL: 49.3 mg/dL (ref >39.00)
LDL CALC: 93 mg/dL (ref 0–99)
NonHDL: 125.92
TRIGLYCERIDES: 164 mg/dL — AB (ref 0.0–149.0)
VLDL: 32.8 mg/dL (ref 0.0–40.0)

## 2016-12-28 LAB — HEPATIC FUNCTION PANEL
ALT: 22 U/L (ref 0–35)
AST: 15 U/L (ref 0–37)
Albumin: 4.3 g/dL (ref 3.5–5.2)
Alkaline Phosphatase: 77 U/L (ref 39–117)
BILIRUBIN DIRECT: 0.1 mg/dL (ref 0.0–0.3)
BILIRUBIN TOTAL: 0.7 mg/dL (ref 0.2–1.2)
Total Protein: 7.4 g/dL (ref 6.0–8.3)

## 2016-12-28 LAB — BASIC METABOLIC PANEL
BUN: 12 mg/dL (ref 6–23)
CALCIUM: 9.5 mg/dL (ref 8.4–10.5)
CO2: 28 mEq/L (ref 19–32)
CREATININE: 0.94 mg/dL (ref 0.40–1.20)
Chloride: 105 mEq/L (ref 96–112)
GFR: 74.94 mL/min (ref >60.00)
Glucose, Bld: 103 mg/dL — ABNORMAL HIGH (ref 70–99)
Potassium: 4.2 mEq/L (ref 3.5–5.1)
Sodium: 139 mEq/L (ref 135–145)

## 2016-12-29 ENCOUNTER — Other Ambulatory Visit: Payer: Self-pay | Admitting: Internal Medicine

## 2016-12-29 DIAGNOSIS — E039 Hypothyroidism, unspecified: Secondary | ICD-10-CM

## 2016-12-29 NOTE — Progress Notes (Signed)
Order placed for f/u tsh.  

## 2016-12-30 ENCOUNTER — Ambulatory Visit (INDEPENDENT_AMBULATORY_CARE_PROVIDER_SITE_OTHER): Payer: Medicare HMO | Admitting: Internal Medicine

## 2016-12-30 ENCOUNTER — Encounter: Payer: Self-pay | Admitting: Internal Medicine

## 2016-12-30 DIAGNOSIS — I10 Essential (primary) hypertension: Secondary | ICD-10-CM | POA: Diagnosis not present

## 2016-12-30 DIAGNOSIS — E7800 Pure hypercholesterolemia, unspecified: Secondary | ICD-10-CM | POA: Diagnosis not present

## 2016-12-30 NOTE — Progress Notes (Signed)
Patient ID: Christina Cole, female   DOB: 07-02-1941, 76 y.o.   MRN: [id number]   Subjective:    Patient ID: Christina Cole, female    DOB: 1941/10/23, 76 y.o.   MRN: [id number]  HPI  Patient here for a scheduled follow up.  She is doing well.  Feels good.  Stays active.  Has been working out in her yard.  No chest pain.  No sob.  No acid reflux.  No abdominal pain.  Bowels moving.     Past Medical History:  Diagnosis Date  . Heart murmur   . Hyperlipidemia   . Hypertension   . Thyroid disease    Past Surgical History:  Procedure Laterality Date  . ABDOMINAL HYSTERECTOMY  1978   ovaries left in place  . APPENDECTOMY  1978  . TUBAL LIGATION     Family History  Problem Relation Age of Onset  . Stroke Mother   . Hypertension Mother   . Arthritis Mother   . Cancer Sister     Breast  . Breast cancer Sister 5  . Cancer Sister     Breast  . Breast cancer Sister 78  . Lung cancer Brother   . Diabetes Brother    Social History   Social History  . Marital status: Single    Spouse name: N/A  . Number of children: 3  . Years of education: N/A   Social History Main Topics  . Smoking status: Never Smoker  . Smokeless tobacco: Never Used  . Alcohol use No  . Drug use: No  . Sexual activity: Not Asked   Other Topics Concern  . None   Social History Narrative  . None    Outpatient Encounter Prescriptions as of 12/30/2016  Medication Sig  . atorvastatin (LIPITOR) 40 MG tablet Take 1 tablet (40 mg total) by mouth daily.  Marland Kitchen diltiazem (TIAZAC) 360 MG 24 hr capsule Take 1 capsule (360 mg total) by mouth daily.  Marland Kitchen EPINEPHrine (EPIPEN 2-PAK) 0.3 mg/0.3 mL IJ SOAJ injection USE AS DIRECTED  . EPINEPHrine (EPIPEN JR) 0.15 MG/0.3ML injection Inject 0.15 mg into the muscle as needed for anaphylaxis.  Marland Kitchen losartan (COZAAR) 100 MG tablet Take 1 tablet (100 mg total) by mouth daily.  . Polyvinyl Alcohol-Povidone (REFRESH OP) Apply to eye.   No facility-administered encounter  medications on file as of 12/30/2016.     Review of Systems  Constitutional: Negative for appetite change and unexpected weight change.  HENT: Negative for congestion and sinus pressure.   Respiratory: Negative for cough, chest tightness and shortness of breath.   Cardiovascular: Negative for chest pain, palpitations and leg swelling.  Gastrointestinal: Negative for abdominal pain, diarrhea, nausea and vomiting.  Genitourinary: Negative for difficulty urinating and dysuria.  Musculoskeletal: Negative for back pain and joint swelling.  Skin: Negative for color change and rash.  Neurological: Negative for dizziness, light-headedness and headaches.  Psychiatric/Behavioral: Negative for agitation and dysphoric mood.       Objective:    Physical Exam  Constitutional: She appears well-developed and well-nourished. No distress.  HENT:  Nose: Nose normal.  Mouth/Throat: Oropharynx is clear and moist.  Neck: Neck supple. No thyromegaly present.  Cardiovascular: Normal rate and regular rhythm.   Pulmonary/Chest: Breath sounds normal. No respiratory distress. She has no wheezes.  Abdominal: Soft. Bowel sounds are normal. There is no tenderness.  Musculoskeletal: She exhibits no edema or tenderness.  Lymphadenopathy:    She has no cervical adenopathy.  Skin: No  rash noted. No erythema.  Psychiatric: She has a normal mood and affect. Her behavior is normal.    BP 116/64 (BP Location: Left Arm, Patient Position: Sitting, Cuff Size: Large)   Pulse 73   Temp 98.6 F (37 C) (Oral)   Resp 16   Ht 5\' 3"  (1.6 m)   Wt 158 lb 9.6 oz (71.9 kg)   LMP 12/21/1976   SpO2 93%   BMI 28.09 kg/m  Wt Readings from Last 3 Encounters:  12/30/16 158 lb 9.6 oz (71.9 kg)  08/26/16 155 lb (70.3 kg)  05/26/16 152 lb 8 oz (69.2 kg)     Lab Results  Component Value Date   WBC 5.4 08/24/2016   HGB 13.2 08/24/2016   HCT 40.1 08/24/2016   PLT 235.0 08/24/2016   GLUCOSE 103 (H) 12/28/2016   CHOL 175  12/28/2016   TRIG 164.0 (H) 12/28/2016   HDL 49.30 12/28/2016   LDLCALC 93 12/28/2016   ALT 22 12/28/2016   AST 15 12/28/2016   NA 139 12/28/2016   K 4.2 12/28/2016   CL 105 12/28/2016   CREATININE 0.94 12/28/2016   BUN 12 12/28/2016   CO2 28 12/28/2016   TSH 1.61 08/24/2016    Mm Screening Breast Tomo Bilateral  Result Date: 09/06/2016 CLINICAL DATA:  Screening. EXAM: 2D DIGITAL SCREENING BILATERAL MAMMOGRAM WITH CAD AND ADJUNCT TOMO COMPARISON:  Previous exam(s). ACR Breast Density Category b: There are scattered areas of fibroglandular density. FINDINGS: There are no findings suspicious for malignancy. Images were processed with CAD. IMPRESSION: No mammographic evidence of malignancy. A result letter of this screening mammogram will be mailed directly to the patient. RECOMMENDATION: Screening mammogram in one year. (Code:SM-B-01Y) BI-RADS CATEGORY  1: Negative. Electronically Signed   By: Curlene Dolphin M.D.   On: 09/06/2016 16:50       Assessment & Plan:   Problem List Items Addressed This Visit    Essential hypertension, benign    Blood pressure under good control.  Continue same medication regimen.  Follow pressures.  Follow metabolic panel.        Relevant Orders   Basic metabolic panel   Hypercholesterolemia    Low cholesterol diet and exercise.  On lipitor.  Follow lipid panel and liver function tests.   Lab Results  Component Value Date   CHOL 175 12/28/2016   HDL 49.30 12/28/2016   LDLCALC 93 12/28/2016   TRIG 164.0 (H) 12/28/2016   CHOLHDL 4 12/28/2016        Relevant Orders   Hepatic function panel   Lipid panel       Einar Pheasant, MD

## 2016-12-30 NOTE — Progress Notes (Signed)
Pre-visit discussion using our clinic review tool. No additional management support is needed unless otherwise documented below in the visit note.  

## 2017-01-01 ENCOUNTER — Encounter: Payer: Self-pay | Admitting: Internal Medicine

## 2017-01-01 NOTE — Assessment & Plan Note (Signed)
Blood pressure under good control.  Continue same medication regimen.  Follow pressures.  Follow metabolic panel.   

## 2017-01-01 NOTE — Assessment & Plan Note (Signed)
Low cholesterol diet and exercise.  On lipitor.  Follow lipid panel and liver function tests.   Lab Results  Component Value Date   CHOL 175 12/28/2016   HDL 49.30 12/28/2016   LDLCALC 93 12/28/2016   TRIG 164.0 (H) 12/28/2016   CHOLHDL 4 12/28/2016

## 2017-02-05 ENCOUNTER — Emergency Department
Admission: EM | Admit: 2017-02-05 | Discharge: 2017-02-05 | Disposition: A | Discharge status: Home / Self Care | Payer: Medicare HMO | Attending: Emergency Medicine | Admitting: Emergency Medicine

## 2017-02-05 ENCOUNTER — Encounter: Payer: Self-pay | Admitting: Emergency Medicine

## 2017-02-05 DIAGNOSIS — J069 Acute upper respiratory infection, unspecified: Secondary | ICD-10-CM

## 2017-02-05 DIAGNOSIS — N183 Chronic kidney disease, stage 3 (moderate): Secondary | ICD-10-CM | POA: Insufficient documentation

## 2017-02-05 DIAGNOSIS — Z79899 Other long term (current) drug therapy: Secondary | ICD-10-CM | POA: Insufficient documentation

## 2017-02-05 DIAGNOSIS — I129 Hypertensive chronic kidney disease with stage 1 through stage 4 chronic kidney disease, or unspecified chronic kidney disease: Secondary | ICD-10-CM | POA: Insufficient documentation

## 2017-02-05 DIAGNOSIS — R0981 Nasal congestion: Secondary | ICD-10-CM | POA: Diagnosis present

## 2017-02-05 MED ORDER — GUAIFENESIN-CODEINE 100-10 MG/5ML PO SOLN: 10.0000 mL | Freq: Three times a day (TID) | ORAL | 0 refills | Status: DC | PRN

## 2017-02-05 MED ORDER — ACETAMINOPHEN-CODEINE 120-12 MG/5ML PO SOLN
10.0000 mL | Freq: Once | ORAL | Status: AC
  Administered 2017-02-05: 10 mL via ORAL

## 2017-02-05 MED ORDER — ACETAMINOPHEN-CODEINE 120-12 MG/5ML PO SOLN
ORAL | Status: AC
  Administered 2017-02-05: 10 mL via ORAL
  Filled 2017-02-05: qty 2

## 2017-02-05 NOTE — ED Provider Notes (Signed)
[facility] Emergency Department Provider Note  ____________________________________________  Time seen: Approximately 9:19 PM  I have reviewed the triage vital signs and the nursing notes.   HISTORY  Chief Complaint Nasal Congestion   HPI Christina Cole is a 76 y.o. female who presents to the emergency department for evaluation of cough, nasal congestion, mild sore throat and sinus pressure since Tuesday. She states that the cough is worse when she tries to lay down. She has not taken anything except Coricidin, which did not help. She states that she had purchased some allergy medication but decided to come to the emergency department instead of taking it. She states that today she had a loose bowel movement but was not watery diarrhea. She currently denies abdominal pain or nausea.    Past Medical History:  Diagnosis Date  . Heart murmur   . Hyperlipidemia   . Hypertension   . Thyroid disease     Patient Active Problem List   Diagnosis Date Noted  . Health care maintenance 01/11/2015  . Osteoarthritis of right knee 10/08/2014  . Chronic kidney disease (CKD), stage III (moderate) 04/15/2014  . Hypokalemia 06/12/2013  . Anemia 06/12/2013  . Essential hypertension, benign 12/30/2012  . Hypercholesterolemia 12/30/2012  . Thyroid disease 12/30/2012  . Environmental allergies 12/30/2012    Past Surgical History:  Procedure Laterality Date  . ABDOMINAL HYSTERECTOMY  1978   ovaries left in place  . APPENDECTOMY  1978  . TUBAL LIGATION      Prior to Admission medications   Medication Sig Start Date End Date Taking? Authorizing Provider  atorvastatin (LIPITOR) 40 MG tablet Take 1 tablet (40 mg total) by mouth daily. 08/26/16   Einar Pheasant, MD  diltiazem (TIAZAC) 360 MG 24 hr capsule Take 1 capsule (360 mg total) by mouth daily. 08/26/16   Einar Pheasant, MD  EPINEPHrine (EPIPEN 2-PAK) 0.3 mg/0.3 mL IJ SOAJ injection USE AS DIRECTED 04/15/16    Einar Pheasant, MD  EPINEPHrine (EPIPEN JR) 0.15 MG/0.3ML injection Inject 0.15 mg into the muscle as needed for anaphylaxis.    Historical Provider, MD  guaiFENesin-codeine 100-10 MG/5ML syrup Take 10 mLs by mouth 3 (three) times daily as needed. 02/05/17   Victorino Dike, FNP  losartan (COZAAR) 100 MG tablet Take 1 tablet (100 mg total) by mouth daily. 08/26/16   Einar Pheasant, MD  Polyvinyl Alcohol-Povidone (REFRESH OP) Apply to eye.    Historical Provider, MD    Allergies Sulfa antibiotics  Family History  Problem Relation Age of Onset  . Stroke Mother   . Hypertension Mother   . Arthritis Mother   . Cancer Sister     Breast  . Breast cancer Sister 34  . Cancer Sister     Breast  . Breast cancer Sister 50  . Lung cancer Brother   . Diabetes Brother     Social History Social History  Substance Use Topics  . Smoking status: Never Smoker  . Smokeless tobacco: Never Used  . Alcohol use No    Review of Systems Constitutional: Negative for fever/chills ENT: Positive sore throat. Cardiovascular: Denies chest pain. Respiratory: Negative for shortness of breath. Positive for cough. Gastrointestinal: Negative for nausea,  no vomiting.  Negative for diarrhea.  Musculoskeletal: Negative for body aches Skin: Negative for rash. Neurological: Negative for headaches ____________________________________________   PHYSICAL EXAM:  VITAL SIGNS: ED Triage Vitals  Enc Vitals Group     BP 02/05/17 2011 (!) 162/78     Pulse  Rate 02/05/17 2011 66     Resp 02/05/17 2011 19     Temp 02/05/17 2011 98.1 F (36.7 C)     Temp Source 02/05/17 2011 Oral     SpO2 02/05/17 2011 98 %     Weight 02/05/17 2017 155 lb (70.3 kg)     Height 02/05/17 2017 5\' 3"  (1.6 m)     Head Circumference --      Peak Flow --      Pain Score 02/05/17 2019 0     Pain Loc --      Pain Edu? --      Excl. in [place]? --     Constitutional: Alert and oriented. Well appearing and in no acute distress. Eyes:  Conjunctivae are normal. EOMI. Ears: Bilateral tympanic membranes appear normal Nose: Nasal congestion noted; no rhinnorhea. Mouth/Throat: Mucous membranes are moist.  Oropharynx mildly erythematous. Tonsils without exudate. Neck: No stridor.  Lymphatic: No cervical lymphadenopathy. Cardiovascular: Normal rate, regular rhythm. Good peripheral circulation. Respiratory: Normal respiratory effort.  No retractions. Breath sounds clear to auscultation throughout. Gastrointestinal: Soft and nontender.  Musculoskeletal: FROM x 4 extremities.  Neurologic:  Normal speech and language.  Skin:  Skin is warm, dry and intact. No rash noted. Psychiatric: Mood and affect are normal. Speech and behavior are normal.  ____________________________________________   LABS (all labs ordered are listed, but only abnormal results are displayed)  Labs Reviewed - No data to display ____________________________________________  EKG  Not indicated ____________________________________________  RADIOLOGY  Not indicated ____________________________________________   PROCEDURES  Procedure(s) performed: None  Critical Care performed: No ____________________________________________   INITIAL IMPRESSION / ASSESSMENT AND PLAN / ED COURSE  76 year old female presenting to the emergency department for evaluation of URI symptoms. Symptoms of been present since Tuesday. She denies fever. She'll be treated with guaifenesin and codeine cough syrup and encouraged to take the allergy medication that she purchased. She will follow up with the primary care provider if her symptoms are not improving over the next 2-3 days. She was instructed to return to the emergency department for symptoms that change or worsen if she is unable to schedule an appointment.  Pertinent labs & imaging results that were available during my care of the patient were reviewed by me and considered in my medical decision making (see chart for  details).  Discharge Medication List as of 02/05/2017  9:49 PM    START taking these medications   Details  guaiFENesin-codeine 100-10 MG/5ML syrup Take 10 mLs by mouth 3 (three) times daily as needed., Starting Sun 02/05/2017, Print        If controlled substance prescribed during this visit, 12 month history viewed on the [facility] prior to issuing an initial prescription for Schedule II or III opiod. ____________________________________________   FINAL CLINICAL IMPRESSION(S) / ED DIAGNOSES  Final diagnoses:  Viral upper respiratory tract infection    Note:  This document was prepared using Dragon voice recognition software and may include unintentional dictation errors.     Victorino Dike, FNP 02/05/17 2322    Delman Kitten, MD 02/05/17 2356

## 2017-02-05 NOTE — ED Triage Notes (Signed)
Pt presents to ED c/o loose bowels and congestion since Monday. Pt states feels nauseated at times.

## 2017-02-05 NOTE — Discharge Instructions (Signed)
Take the cough medication as prescribed. Follow up with Dr. Nicki Reaper if you are not feeling any better over the next few days. Return to the ER for symptoms that change or worsen or for new concerns. You may also take the allergy medication you bought today.

## 2017-02-21 DIAGNOSIS — E041 Nontoxic single thyroid nodule: Secondary | ICD-10-CM | POA: Diagnosis not present

## 2017-02-21 DIAGNOSIS — E049 Nontoxic goiter, unspecified: Secondary | ICD-10-CM | POA: Diagnosis not present

## 2017-03-03 DIAGNOSIS — E049 Nontoxic goiter, unspecified: Secondary | ICD-10-CM | POA: Diagnosis not present

## 2017-03-03 DIAGNOSIS — E785 Hyperlipidemia, unspecified: Secondary | ICD-10-CM | POA: Diagnosis not present

## 2017-03-03 DIAGNOSIS — I10 Essential (primary) hypertension: Secondary | ICD-10-CM | POA: Diagnosis not present

## 2017-03-03 DIAGNOSIS — E041 Nontoxic single thyroid nodule: Secondary | ICD-10-CM | POA: Diagnosis not present

## 2017-05-22 ENCOUNTER — Other Ambulatory Visit (INDEPENDENT_AMBULATORY_CARE_PROVIDER_SITE_OTHER): Payer: Medicare HMO

## 2017-05-22 DIAGNOSIS — I10 Essential (primary) hypertension: Secondary | ICD-10-CM | POA: Diagnosis not present

## 2017-05-22 DIAGNOSIS — E039 Hypothyroidism, unspecified: Secondary | ICD-10-CM

## 2017-05-22 DIAGNOSIS — E7800 Pure hypercholesterolemia, unspecified: Secondary | ICD-10-CM

## 2017-05-22 LAB — BASIC METABOLIC PANEL
BUN: 12 mg/dL (ref 6–23)
CALCIUM: 9.8 mg/dL (ref 8.4–10.5)
CHLORIDE: 105 meq/L (ref 96–112)
CO2: 27 mEq/L (ref 19–32)
CREATININE: 1.14 mg/dL (ref 0.40–1.20)
GFR: 59.92 mL/min — ABNORMAL LOW (ref >60.00)
Glucose, Bld: 108 mg/dL — ABNORMAL HIGH (ref 70–99)
Potassium: 4.1 mEq/L (ref 3.5–5.1)
Sodium: 137 mEq/L (ref 135–145)

## 2017-05-22 LAB — TSH: TSH: 1.69 u[IU]/mL (ref 0.35–4.50)

## 2017-05-22 LAB — HEPATIC FUNCTION PANEL
ALK PHOS: 76 U/L (ref 39–117)
ALT: 17 U/L (ref 0–35)
AST: 15 U/L (ref 0–37)
Albumin: 4.2 g/dL (ref 3.5–5.2)
BILIRUBIN DIRECT: 0.2 mg/dL (ref 0.0–0.3)
BILIRUBIN TOTAL: 1 mg/dL (ref 0.2–1.2)
TOTAL PROTEIN: 7.5 g/dL (ref 6.0–8.3)

## 2017-05-22 LAB — LIPID PANEL
CHOL/HDL RATIO: 4
Cholesterol: 170 mg/dL (ref 0–200)
HDL: 46.9 mg/dL (ref >39.00)
LDL Cholesterol: 90 mg/dL (ref 0–99)
NONHDL: 123.48
TRIGLYCERIDES: 166 mg/dL — AB (ref 0.0–149.0)
VLDL: 33.2 mg/dL (ref 0.0–40.0)

## 2017-05-23 ENCOUNTER — Other Ambulatory Visit (HOSPITAL_COMMUNITY)
Admission: RE | Admit: 2017-05-23 | Discharge: 2017-05-23 | Disposition: A | Discharge status: Home / Self Care | Payer: Medicare HMO | Source: Ambulatory Visit | Attending: Internal Medicine | Admitting: Internal Medicine

## 2017-05-23 ENCOUNTER — Telehealth: Payer: Self-pay | Admitting: Internal Medicine

## 2017-05-23 ENCOUNTER — Encounter: Payer: Self-pay | Admitting: Internal Medicine

## 2017-05-23 ENCOUNTER — Ambulatory Visit (INDEPENDENT_AMBULATORY_CARE_PROVIDER_SITE_OTHER): Payer: Medicare HMO | Admitting: Internal Medicine

## 2017-05-23 VITALS — BP 130/76 | HR 74 | Temp 98.6°F | Resp 12 | Ht 63.0 in | Wt 153.4 lb

## 2017-05-23 DIAGNOSIS — Z23 Encounter for immunization: Secondary | ICD-10-CM | POA: Diagnosis not present

## 2017-05-23 DIAGNOSIS — N949 Unspecified condition associated with female genital organs and menstrual cycle: Secondary | ICD-10-CM

## 2017-05-23 DIAGNOSIS — E7800 Pure hypercholesterolemia, unspecified: Secondary | ICD-10-CM

## 2017-05-23 DIAGNOSIS — B9689 Other specified bacterial agents as the cause of diseases classified elsewhere: Secondary | ICD-10-CM | POA: Insufficient documentation

## 2017-05-23 DIAGNOSIS — Z8371 Family history of colonic polyps: Secondary | ICD-10-CM

## 2017-05-23 DIAGNOSIS — Z0000 Encounter for general adult medical examination without abnormal findings: Secondary | ICD-10-CM | POA: Diagnosis not present

## 2017-05-23 DIAGNOSIS — I10 Essential (primary) hypertension: Secondary | ICD-10-CM

## 2017-05-23 DIAGNOSIS — E079 Disorder of thyroid, unspecified: Secondary | ICD-10-CM | POA: Diagnosis not present

## 2017-05-23 DIAGNOSIS — Z1211 Encounter for screening for malignant neoplasm of colon: Secondary | ICD-10-CM

## 2017-05-23 MED ORDER — NYSTATIN 100000 UNIT/GM EX CREA: 1.0000 "application " | TOPICAL_CREAM | Freq: Two times a day (BID) | CUTANEOUS | 0 refills | Status: DC

## 2017-05-23 MED ORDER — EPINEPHRINE 0.3 MG/0.3ML IJ SOAJ: INTRAMUSCULAR | 0 refills | Status: DC

## 2017-05-23 MED ORDER — ZOSTER VAC RECOMB ADJUVANTED 50 MCG/0.5ML IM SUSR: 0.5000 mL | Freq: Once | INTRAMUSCULAR | 0 refills | Status: AC

## 2017-05-23 NOTE — Telephone Encounter (Signed)
Patient states that it was done at [place] back in 2009. Do you need me to call and get results.

## 2017-05-23 NOTE — Assessment & Plan Note (Addendum)
Physical today 05/23/17.  Mammogram 09/06/16 - birads I.  Colonoscopy 20009.

## 2017-05-23 NOTE — Telephone Encounter (Signed)
Yes, need colonoscopy report.

## 2017-05-23 NOTE — Progress Notes (Signed)
Pre-visit discussion using our clinic review tool. No additional management support is needed unless otherwise documented below in the visit note.  

## 2017-05-23 NOTE — Telephone Encounter (Signed)
Pt called about wanting to give regarding coloscopy 03/20/2008 Dr Burnard Leigh. Thank you!

## 2017-05-23 NOTE — Progress Notes (Signed)
Patient ID: Ileane Sando, female   DOB: December 06, 1940, 76 y.o.   MRN: [id number]   Subjective:    Patient ID: [name], female    DOB: 1941/10/14, 76 y.o.   MRN: [id number]  HPI  Patient here for her physical exam.  She has been under increased stress recently.  Was taking care of her niece (had cancer).  Passed away recently.  She feels things are starting to level off.  Does not feel need anything more at this time.  She is trying to stay active.  Eating .  No chest pain.  Breathing stable.  No acid reflux.  No abdominal pain.  Bowels stable.  She does report three weeks of irritation - perivaginal area.  Some odor.  Discussed colonoscopy.  Has family history of colon polyps.     Past Medical History:  Diagnosis Date  . Heart murmur   . Hyperlipidemia   . Hypertension   . Thyroid disease    Past Surgical History:  Procedure Laterality Date  . ABDOMINAL HYSTERECTOMY  1978   ovaries left in place  . APPENDECTOMY  1978  . TUBAL LIGATION     Family History  Problem Relation Age of Onset  . Stroke Mother   . Hypertension Mother   . Arthritis Mother   . Cancer Sister        Breast  . Breast cancer Sister 13  . Cancer Sister        Breast  . Breast cancer Sister 25  . Lung cancer Brother   . Diabetes Brother    Social History   Social History  . Marital status: Single    Spouse name: N/A  . Number of children: 3  . Years of education: N/A   Social History Main Topics  . Smoking status: Never Smoker  . Smokeless tobacco: Never Used  . Alcohol use No  . Drug use: No  . Sexual activity: Not Asked   Other Topics Concern  . None   Social History Narrative  . None    Outpatient Encounter Prescriptions as of 05/23/2017  Medication Sig  . atorvastatin (LIPITOR) 40 MG tablet Take 1 tablet (40 mg total) by mouth daily.  Marland Kitchen diltiazem (TIAZAC) 360 MG 24 hr capsule Take 1 capsule (360 mg total) by mouth daily.  Marland Kitchen EPINEPHrine (EPIPEN 2-PAK) 0.3 mg/0.3 mL IJ SOAJ  injection USE AS DIRECTED  . EPINEPHrine (EPIPEN JR) 0.15 MG/0.3ML injection Inject 0.15 mg into the muscle as needed for anaphylaxis.  Marland Kitchen levothyroxine (SYNTHROID, LEVOTHROID) 25 MCG tablet Take 25 mcg by mouth daily before breakfast.  . losartan (COZAAR) 100 MG tablet Take 1 tablet (100 mg total) by mouth daily.  . Polyvinyl Alcohol-Povidone (REFRESH OP) Apply to eye.  . [DISCONTINUED] EPINEPHrine (EPIPEN 2-PAK) 0.3 mg/0.3 mL IJ SOAJ injection USE AS DIRECTED  . nystatin cream (MYCOSTATIN) Apply 1 application topically 2 (two) times daily.  . [EXPIRED] Zoster Vac Recomb Adjuvanted (SHINGRIX) injection Inject 0.5 mLs into the muscle once.  . [DISCONTINUED] guaiFENesin-codeine 100-10 MG/5ML syrup Take 10 mLs by mouth 3 (three) times daily as needed.   No facility-administered encounter medications on file as of 05/23/2017.     Review of Systems  Constitutional: Negative for appetite change and unexpected weight change.  HENT: Negative for congestion and sinus pressure.   Eyes: Negative for pain and visual disturbance.  Respiratory: Negative for cough, chest tightness and shortness of breath.   Cardiovascular: Negative for chest pain, palpitations and  leg swelling.  Gastrointestinal: Negative for abdominal pain, diarrhea, nausea and vomiting.  Genitourinary: Negative for difficulty urinating and dysuria.       Vaginal irritation.  Odor.   Musculoskeletal: Negative for back pain and joint swelling.  Skin: Negative for color change and rash.  Neurological: Negative for dizziness, light-headedness and headaches.  Hematological: Negative for adenopathy. Does not bruise/bleed easily.  Psychiatric/Behavioral: Negative for agitation and dysphoric mood.       Objective:    Physical Exam  Constitutional: She is oriented to person, place, and time. She appears well-developed and well-nourished. No distress.  HENT:  Nose: Nose normal.  Mouth/Throat: Oropharynx is clear and moist.  Eyes: Right  eye exhibits no discharge. Left eye exhibits no discharge. No scleral icterus.  Neck: Neck supple. No thyromegaly present.  Cardiovascular: Normal rate and regular rhythm.   Pulmonary/Chest: Breath sounds normal. No accessory muscle usage. No tachypnea. No respiratory distress. She has no decreased breath sounds. She has no wheezes. She has no rhonchi. Right breast exhibits no inverted nipple, no mass, no nipple discharge and no tenderness (no axillary adenopathy). Left breast exhibits no inverted nipple, no mass, no nipple discharge and no tenderness (no axilarry adenopathy).  Abdominal: Soft. Bowel sounds are normal. There is no tenderness.  Genitourinary:  Genitourinary Comments: Normal external genitalia.  Minima erythema.  Vaginal vault without lesions.  KOH/wet prep.   Could not appreciate any adnexal masses or tenderness.    Musculoskeletal: She exhibits no edema or tenderness.  Lymphadenopathy:    She has no cervical adenopathy.  Neurological: She is alert and oriented to person, place, and time.  Skin: Skin is warm. No rash noted. No erythema.  Psychiatric: She has a normal mood and affect. Her behavior is normal.    BP 130/76 (BP Location: Left Arm, Patient Position: Sitting, Cuff Size: Normal)   Pulse 74   Temp 98.6 F (37 C) (Oral)   Resp 12   Ht 5\' 3"  (1.6 m)   Wt 153 lb 6.4 oz (69.6 kg)   LMP 12/21/1976   SpO2 96%   BMI 27.17 kg/m  Wt Readings from Last 3 Encounters:  05/23/17 153 lb 6.4 oz (69.6 kg)  02/05/17 155 lb (70.3 kg)  12/30/16 158 lb 9.6 oz (71.9 kg)     Lab Results  Component Value Date   WBC 5.4 08/24/2016   HGB 13.2 08/24/2016   HCT 40.1 08/24/2016   PLT 235.0 08/24/2016   GLUCOSE 108 (H) 05/22/2017   CHOL 170 05/22/2017   TRIG 166.0 (H) 05/22/2017   HDL 46.90 05/22/2017   LDLCALC 90 05/22/2017   ALT 17 05/22/2017   AST 15 05/22/2017   NA 137 05/22/2017   K 4.1 05/22/2017   CL 105 05/22/2017   CREATININE 1.14 05/22/2017   BUN 12 05/22/2017     CO2 27 05/22/2017   TSH 1.69 05/22/2017       Assessment & Plan:   Problem List Items Addressed This Visit    Essential hypertension, benign    Blood pressure under good control.  Continue same medication regimen.  Follow pressures.  Follow metabolic panel.        Relevant Medications   EPINEPHrine (EPIPEN 2-PAK) 0.3 mg/0.3 mL IJ SOAJ injection   Health care maintenance    Physical today 05/23/17.  Mammogram 09/06/16 - birads I.  Colonoscopy 20009.        Hypercholesterolemia    On lipitor.  Low cholesterol diet and exercise.  Follow  lipid panel and liver function tests.        Relevant Medications   EPINEPHrine (EPIPEN 2-PAK) 0.3 mg/0.3 mL IJ SOAJ injection   Thyroid disease    Has been followed by Dr Carmela Rima.  On thyroid replacement.       Relevant Medications   levothyroxine (SYNTHROID, LEVOTHROID) 25 MCG tablet    Other Visit Diagnoses    Need for shingles vaccine    -  Primary   Vaginal discomfort       Perivaginal irritation.  Nystatin cream as directed.  KOH/wet prep sent.    Relevant Orders   Cervicovaginal ancillary only (Completed)       Einar Pheasant, MD

## 2017-05-24 ENCOUNTER — Other Ambulatory Visit: Payer: Self-pay | Admitting: Internal Medicine

## 2017-05-24 LAB — CERVICOVAGINAL ANCILLARY ONLY: Wet Prep (BD Affirm): POSITIVE — AB

## 2017-05-24 MED ORDER — METRONIDAZOLE 0.75 % VA GEL: 1.0000 | Freq: Two times a day (BID) | VAGINAL | 0 refills | Status: DC

## 2017-05-24 NOTE — Telephone Encounter (Signed)
Left message to return call to our office.  

## 2017-05-24 NOTE — Telephone Encounter (Signed)
Called patient she has copy of report will bring by in the next day or two

## 2017-05-24 NOTE — Telephone Encounter (Signed)
Called patient back she gave me the number to [facility] I called their office and she has had it done in 2009. Per their office I need to call [facility] to get report.

## 2017-05-24 NOTE — Progress Notes (Signed)
rx sent in for metrogel vaginal.   

## 2017-05-24 NOTE — Telephone Encounter (Signed)
Called Christina Cole not ever seen in GI with them.

## 2017-05-24 NOTE — Telephone Encounter (Signed)
Ok.  Hold until receive.

## 2017-05-25 NOTE — Telephone Encounter (Signed)
Received copy in your yellow folder for review.

## 2017-05-26 ENCOUNTER — Encounter: Payer: Self-pay | Admitting: Internal Medicine

## 2017-05-26 NOTE — Assessment & Plan Note (Signed)
Blood pressure under good control.  Continue same medication regimen.  Follow pressures.  Follow metabolic panel.   

## 2017-05-26 NOTE — Assessment & Plan Note (Signed)
On lipitor.  Low cholesterol diet and exercise.  Follow lipid panel and liver function tests.   

## 2017-05-26 NOTE — Telephone Encounter (Signed)
Reviewed colonoscopy report.  Please confirm with pt her family history.  (need to know who in her family had colon polyps or colon cancer).  If close relatives with polyps or cancer, will go ahead and place order for referral to GI for f/u colonoscopy.

## 2017-05-26 NOTE — Assessment & Plan Note (Signed)
Has been followed by Dr Carmela Rima.  On thyroid replacement.

## 2017-05-26 NOTE — Telephone Encounter (Signed)
Left message to return call to our office.  

## 2017-05-29 NOTE — Telephone Encounter (Signed)
Patient called back she states that her mother, brother and sister all have had history of Polyps. She would like referral sent to someone who will do at [facility]. She would like the appointment in September.

## 2017-05-30 NOTE — Telephone Encounter (Signed)
Order placed for GI referral.   

## 2017-05-31 ENCOUNTER — Telehealth: Payer: Self-pay | Admitting: Internal Medicine

## 2017-05-31 NOTE — Telephone Encounter (Signed)
Left pt message asking to call Allison back directly at [phone] to schedule AWV. Thanks! ° °*NOTE* Last AWV 02/25/15 °

## 2017-06-05 DIAGNOSIS — E049 Nontoxic goiter, unspecified: Secondary | ICD-10-CM | POA: Diagnosis not present

## 2017-06-12 DIAGNOSIS — E785 Hyperlipidemia, unspecified: Secondary | ICD-10-CM | POA: Diagnosis not present

## 2017-06-12 DIAGNOSIS — I10 Essential (primary) hypertension: Secondary | ICD-10-CM | POA: Diagnosis not present

## 2017-06-12 DIAGNOSIS — E041 Nontoxic single thyroid nodule: Secondary | ICD-10-CM | POA: Diagnosis not present

## 2017-06-12 DIAGNOSIS — E039 Hypothyroidism, unspecified: Secondary | ICD-10-CM | POA: Diagnosis not present

## 2017-06-12 DIAGNOSIS — E049 Nontoxic goiter, unspecified: Secondary | ICD-10-CM | POA: Diagnosis not present

## 2017-06-14 ENCOUNTER — Other Ambulatory Visit: Payer: Self-pay

## 2017-06-14 ENCOUNTER — Telehealth: Payer: Self-pay

## 2017-06-14 DIAGNOSIS — Z1211 Encounter for screening for malignant neoplasm of colon: Secondary | ICD-10-CM

## 2017-06-14 DIAGNOSIS — Z8371 Family history of colonic polyps: Secondary | ICD-10-CM

## 2017-06-14 NOTE — Telephone Encounter (Signed)
Gastroenterology Pre-Procedure Review  Request Date: 07/11/17 Requesting Physician: Dr. Allen Norris  PATIENT REVIEW QUESTIONS: The patient responded to the following health history questions as indicated:    0.  Being treated for any acute or chronic illnesses? No 1. Are you having any GI issues? no 2. Do you have a personal history of Polyps? no 3. Do you have a family history of Colon Cancer or Polyps? yes (Mom and Brother polyps) 4. Diabetes Mellitus? no 5. Joint replacements in the past 12 months?no 6. Major health problems in the past 3 months?no 7. Any artificial heart valves, MVP, or defibrillator?no    MEDICATIONS & ALLERGIES:    Patient reports the following regarding taking any anticoagulation/antiplatelet therapy:   Plavix, Coumadin, Eliquis, Xarelto, Lovenox, Pradaxa, Brilinta, or Effient? no Aspirin? no  Patient confirms/reports the following medications:  Current Outpatient Prescriptions  Medication Sig Dispense Refill  . atorvastatin (LIPITOR) 40 MG tablet Take 1 tablet (40 mg total) by mouth daily. 90 tablet 3  . diltiazem (TIAZAC) 360 MG 24 hr capsule Take 1 capsule (360 mg total) by mouth daily. 90 capsule 3  . EPINEPHrine (EPIPEN 2-PAK) 0.3 mg/0.3 mL IJ SOAJ injection USE AS DIRECTED 2 Device 0  . EPINEPHrine (EPIPEN JR) 0.15 MG/0.3ML injection Inject 0.15 mg into the muscle as needed for anaphylaxis.    Marland Kitchen levothyroxine (SYNTHROID, LEVOTHROID) 25 MCG tablet Take 25 mcg by mouth daily before breakfast.    . losartan (COZAAR) 100 MG tablet Take 1 tablet (100 mg total) by mouth daily. 90 tablet 3  . metroNIDAZOLE (METROGEL VAGINAL) 0.75 % vaginal gel Place 1 Applicatorful vaginally 2 (two) times daily. One applicator bid for 5 days 70 g 0  . nystatin cream (MYCOSTATIN) Apply 1 application topically 2 (two) times daily. 30 g 0  . Polyvinyl Alcohol-Povidone (REFRESH OP) Apply to eye.     No current facility-administered medications for this visit.     Patient  confirms/reports the following allergies:  Allergies  Allergen Reactions  . Sulfa Antibiotics Rash    No orders of the defined types were placed in this encounter.   AUTHORIZATION INFORMATION Primary Insurance: 1D#: Group #:  Secondary Insurance: 1D#: Group #:  SCHEDULE INFORMATION: Date: 07/11/17 Time: Location:[facility]

## 2017-07-10 ENCOUNTER — Encounter: Payer: Self-pay | Admitting: *Deleted

## 2017-07-11 ENCOUNTER — Encounter: Payer: Self-pay | Admitting: *Deleted

## 2017-07-11 ENCOUNTER — Ambulatory Visit: Payer: Medicare HMO | Admitting: Anesthesiology

## 2017-07-11 ENCOUNTER — Encounter
Admission: RE | Disposition: A | Discharge status: Home / Self Care | Payer: Self-pay | Source: Ambulatory Visit | Attending: Gastroenterology

## 2017-07-11 ENCOUNTER — Ambulatory Visit
Admission: RE | Admit: 2017-07-11 | Discharge: 2017-07-11 | Disposition: A | Discharge status: Home / Self Care | Payer: Medicare HMO | Source: Ambulatory Visit | Attending: Gastroenterology | Admitting: Gastroenterology

## 2017-07-11 DIAGNOSIS — Z79899 Other long term (current) drug therapy: Secondary | ICD-10-CM | POA: Diagnosis not present

## 2017-07-11 DIAGNOSIS — E785 Hyperlipidemia, unspecified: Secondary | ICD-10-CM | POA: Diagnosis not present

## 2017-07-11 DIAGNOSIS — K631 Perforation of intestine (nontraumatic): Secondary | ICD-10-CM

## 2017-07-11 DIAGNOSIS — I10 Essential (primary) hypertension: Secondary | ICD-10-CM | POA: Insufficient documentation

## 2017-07-11 DIAGNOSIS — K621 Rectal polyp: Secondary | ICD-10-CM | POA: Insufficient documentation

## 2017-07-11 DIAGNOSIS — Z801 Family history of malignant neoplasm of trachea, bronchus and lung: Secondary | ICD-10-CM | POA: Diagnosis not present

## 2017-07-11 DIAGNOSIS — Z803 Family history of malignant neoplasm of breast: Secondary | ICD-10-CM | POA: Diagnosis not present

## 2017-07-11 DIAGNOSIS — Z8371 Family history of colonic polyps: Secondary | ICD-10-CM

## 2017-07-11 DIAGNOSIS — Z833 Family history of diabetes mellitus: Secondary | ICD-10-CM | POA: Diagnosis not present

## 2017-07-11 DIAGNOSIS — Z1211 Encounter for screening for malignant neoplasm of colon: Secondary | ICD-10-CM | POA: Diagnosis not present

## 2017-07-11 DIAGNOSIS — D123 Benign neoplasm of transverse colon: Secondary | ICD-10-CM | POA: Diagnosis not present

## 2017-07-11 DIAGNOSIS — Z83719 Family history of colon polyps, unspecified: Secondary | ICD-10-CM

## 2017-07-11 DIAGNOSIS — D125 Benign neoplasm of sigmoid colon: Secondary | ICD-10-CM | POA: Insufficient documentation

## 2017-07-11 DIAGNOSIS — Z882 Allergy status to sulfonamides status: Secondary | ICD-10-CM | POA: Insufficient documentation

## 2017-07-11 DIAGNOSIS — D649 Anemia, unspecified: Secondary | ICD-10-CM | POA: Diagnosis not present

## 2017-07-11 DIAGNOSIS — Z823 Family history of stroke: Secondary | ICD-10-CM | POA: Insufficient documentation

## 2017-07-11 DIAGNOSIS — K5730 Diverticulosis of large intestine without perforation or abscess without bleeding: Secondary | ICD-10-CM | POA: Insufficient documentation

## 2017-07-11 DIAGNOSIS — D128 Benign neoplasm of rectum: Secondary | ICD-10-CM | POA: Diagnosis not present

## 2017-07-11 DIAGNOSIS — K635 Polyp of colon: Secondary | ICD-10-CM

## 2017-07-11 DIAGNOSIS — Z881 Allergy status to other antibiotic agents status: Secondary | ICD-10-CM | POA: Diagnosis not present

## 2017-07-11 DIAGNOSIS — E079 Disorder of thyroid, unspecified: Secondary | ICD-10-CM | POA: Diagnosis not present

## 2017-07-11 DIAGNOSIS — M1990 Unspecified osteoarthritis, unspecified site: Secondary | ICD-10-CM | POA: Insufficient documentation

## 2017-07-11 DIAGNOSIS — K5790 Diverticulosis of intestine, part unspecified, without perforation or abscess without bleeding: Secondary | ICD-10-CM | POA: Diagnosis not present

## 2017-07-11 DIAGNOSIS — Z800 Family history of malignant neoplasm of digestive organs: Secondary | ICD-10-CM | POA: Diagnosis not present

## 2017-07-11 HISTORY — PX: COLONOSCOPY WITH PROPOFOL: SHX5780

## 2017-07-11 SURGERY — COLONOSCOPY WITH PROPOFOL
Anesthesia: General

## 2017-07-11 MED ORDER — LIDOCAINE HCL (PF) 2 % IJ SOLN
INTRAMUSCULAR | Status: AC
  Filled 2017-07-11: qty 2

## 2017-07-11 MED ORDER — PROPOFOL 500 MG/50ML IV EMUL
INTRAVENOUS | Status: AC
  Filled 2017-07-11: qty 50

## 2017-07-11 MED ORDER — PROPOFOL 500 MG/50ML IV EMUL
INTRAVENOUS | Status: DC | PRN
  Administered 2017-07-11: 125 ug/kg/min via INTRAVENOUS

## 2017-07-11 MED ORDER — SODIUM CHLORIDE 0.9 % IV SOLN
INTRAVENOUS | Status: DC
  Administered 2017-07-11: 1000 mL via INTRAVENOUS

## 2017-07-11 MED ORDER — PROPOFOL 10 MG/ML IV BOLUS
INTRAVENOUS | Status: DC | PRN
  Administered 2017-07-11: 60 mg via INTRAVENOUS
  Administered 2017-07-11: 20 mg via INTRAVENOUS

## 2017-07-11 MED ORDER — LIDOCAINE HCL (CARDIAC) 20 MG/ML IV SOLN
INTRAVENOUS | Status: DC | PRN
Start: 2017-07-11 — End: 2017-07-11
  Administered 2017-07-11: 40 mg via INTRAVENOUS

## 2017-07-11 NOTE — Op Note (Signed)
[facility] Gastroenterology Patient Name: Christina Cole Procedure Date: 07/11/2017 10:28 AM MRN: [id number] Account #: [id number] Date of Birth: 08-23-1941 Admit Type: Outpatient Age: 76 Room: [facility] ENDO ROOM 4 Gender: Female Note Status: Finalized Procedure:            Colonoscopy Indications:          Family history of advanced adenoma of the colon in a                        first-degree relative Providers:            Lucilla Lame MD, MD Referring MD:         Einar Pheasant, MD (Referring MD) Medicines:            Propofol per Anesthesia Complications:        No immediate complications. Procedure:            Pre-Anesthesia Assessment:                       - Prior to the procedure, a History and Physical was                        performed, and patient medications and allergies were                        reviewed. The patient's tolerance of previous                        anesthesia was also reviewed. The risks and benefits of                        the procedure and the sedation options and risks were                        discussed with the patient. All questions were                        answered, and informed consent was obtained. Prior                        Anticoagulants: The patient has taken no previous                        anticoagulant or antiplatelet agents. ASA Grade                        Assessment: II - A patient with mild systemic disease.                        After reviewing the risks and benefits, the patient was                        deemed in satisfactory condition to undergo the                        procedure.                       After obtaining informed consent, the colonoscope was  passed under direct vision. Throughout the procedure,                        the patient's blood pressure, pulse, and oxygen                        saturations were monitored continuously. The Olympus   CF-H180AL colonoscope ( S#: [id number] ) was introduced                        through the anus and advanced to the the cecum,                        identified by appendiceal orifice and ileocecal valve.                        The colonoscopy was performed without difficulty. The                        patient tolerated the procedure well. The quality of                        the bowel preparation was excellent. Findings:      The perianal and digital rectal examinations were normal.      Two sessile polyps were found in the transverse colon. The polyps were 3       to 4 mm in size. These polyps were removed with a cold biopsy forceps.       Resection and retrieval were complete.      A 3 mm polyp was found in the rectum. The polyp was sessile. The polyp       was removed with a cold biopsy forceps. Resection and retrieval were       complete.      A 8 mm polyp was found in the sigmoid colon. The polyp was pedunculated.       The polyp was removed with a hot snare. Resection and retrieval were       complete. To prevent bleeding post-intervention, one hemostatic clip was       successfully placed (MR conditional). There was no bleeding at the end       of the procedure.      A few small-mouthed diverticula were found in the entire colon. Impression:           - Two 3 to 4 mm polyps in the transverse colon, removed                        with a cold biopsy forceps. Resected and retrieved.                       - One 3 mm polyp in the rectum, removed with a cold                        biopsy forceps. Resected and retrieved.                       - One 8 mm polyp in the sigmoid colon, removed with a  hot snare. Resected and retrieved. Clip (MR                        conditional) was placed.                       - Diverticulosis in the entire examined colon. Recommendation:       - Discharge patient to home.                       - Resume previous diet.                        - Continue present medications.                       - Await pathology results.                       - Repeat colonoscopy in 5 years for surveillance. Procedure Code(s):    --- Professional ---                       [id number], Colonoscopy, flexible; with removal of tumor(s),                        polyp(s), or other lesion(s) by snare technique                       45380, 49, Colonoscopy, flexible; with biopsy, single                        or multiple Diagnosis Code(s):    --- Professional ---                       Z83.71, Family history of colonic polyps                       D12.3, Benign neoplasm of transverse colon (hepatic                        flexure or splenic flexure)                       K62.1, Rectal polyp                       D12.5, Benign neoplasm of sigmoid colon CPT copyright 2016 American Medical Association. All rights reserved. The codes documented in this report are preliminary and upon coder review may  be revised to meet current compliance requirements. Lucilla Lame MD, MD 07/11/2017 10:55:20 AM This report has been signed electronically. Number of Addenda: 0 Note Initiated On: 07/11/2017 10:28 AM Scope Withdrawal Time: 0 hours 11 minutes 2 seconds  Total Procedure Duration: 0 hours 18 minutes 43 seconds       [facility]

## 2017-07-11 NOTE — H&P (Signed)
Lucilla Lame, MD [address] [place], [place] [address] Phone:[phone] Fax : [phone]  Primary Care Physician:  Einar Pheasant, MD Primary Gastroenterologist:  Dr. Allen Norris  Pre-Procedure History & Physical: HPI:  Christina Cole is a 76 y.o. female is here for an colonoscopy.   Past Medical History:  Diagnosis Date  . Heart murmur   . Hyperlipidemia   . Hypertension   . Thyroid disease     Past Surgical History:  Procedure Laterality Date  . ABDOMINAL HYSTERECTOMY  1978   ovaries left in place  . APPENDECTOMY  1978  . TUBAL LIGATION      Prior to Admission medications   Medication Sig Start Date End Date Taking? Authorizing Provider  atorvastatin (LIPITOR) 40 MG tablet Take 1 tablet (40 mg total) by mouth daily. 08/26/16   Einar Pheasant, MD  diltiazem (TIAZAC) 360 MG 24 hr capsule Take 1 capsule (360 mg total) by mouth daily. 08/26/16   Einar Pheasant, MD  EPINEPHrine (EPIPEN 2-PAK) 0.3 mg/0.3 mL IJ SOAJ injection USE AS DIRECTED 05/23/17   Einar Pheasant, MD  EPINEPHrine (EPIPEN JR) 0.15 MG/0.3ML injection Inject 0.15 mg into the muscle as needed for anaphylaxis.    [provider]  levothyroxine (SYNTHROID, LEVOTHROID) 25 MCG tablet Take 25 mcg by mouth daily before breakfast.    [provider]  losartan (COZAAR) 100 MG tablet Take 1 tablet (100 mg total) by mouth daily. 08/26/16   Einar Pheasant, MD  metroNIDAZOLE (METROGEL VAGINAL) 0.75 % vaginal gel Place 1 Applicatorful vaginally 2 (two) times daily. One applicator bid for 5 days 05/24/17   Einar Pheasant, MD  nystatin cream (MYCOSTATIN) Apply 1 application topically 2 (two) times daily. 05/23/17   Einar Pheasant, MD  Polyvinyl Alcohol-Povidone (REFRESH OP) Apply to eye.    [provider]    Allergies as of 06/14/2017 - Review Complete 05/26/2017  Allergen Reaction Noted  . Sulfa antibiotics Rash 12/21/2012    Family History  Problem Relation Age of Onset    . Stroke Mother   . Hypertension Mother   . Arthritis Mother   . Cancer Sister        Breast  . Breast cancer Sister 13  . Cancer Sister        Breast  . Breast cancer Sister 86  . Lung cancer Brother   . Diabetes Brother     Social History   Social History  . Marital status: Single    Spouse name: N/A  . Number of children: 3  . Years of education: N/A   Occupational History  . Not on file.   Social History Main Topics  . Smoking status: Never Smoker  . Smokeless tobacco: Never Used  . Alcohol use No  . Drug use: No  . Sexual activity: Not on file   Other Topics Concern  . Not on file   Social History Narrative  . No narrative on file    Review of Systems: See HPI, otherwise negative ROS  Physical Exam: BP (!) 161/90   Pulse 81   Temp (!) 97 F (36.1 C) (Tympanic)   Resp 16   Ht 5\' 3"  (1.6 m)   Wt 156 lb (70.8 kg)   LMP 12/21/1976   SpO2 100%   BMI 27.63 kg/m  General:   Alert,  pleasant and cooperative in NAD Head:  Normocephalic and atraumatic. Neck:  Supple; no masses or thyromegaly. Lungs:  Clear throughout to auscultation.  Heart:  Regular rate and rhythm. Abdomen:  Soft, nontender and nondistended. Normal bowel sounds, without guarding, and without rebound.   Neurologic:  Alert and  oriented x4;  grossly normal neurologically.  Impression/Plan: Christina Cole is here for an colonoscopy to be performed for family history of colon polyps  Risks, benefits, limitations, and alternatives regarding  colonoscopy have been reviewed with the patient.  Questions have been answered.  All parties agreeable.   Lucilla Lame, MD  07/11/2017, 10:18 AM

## 2017-07-11 NOTE — Telephone Encounter (Signed)
Left pt message asking to call Ebony Hail back directly at [phone] to schedule AWV. Thanks!  *NOTE* Last AWV 02/25/15

## 2017-07-11 NOTE — Anesthesia Post-op Follow-up Note (Signed)
Anesthesia QCDR form completed.        

## 2017-07-11 NOTE — Anesthesia Procedure Notes (Signed)
Performed by: Jireh Elmore Pre-anesthesia Checklist: Patient identified, Emergency Drugs available, Suction available, Patient being monitored and Timeout performed Patient Re-evaluated:Patient Re-evaluated prior to induction Oxygen Delivery Method: Nasal cannula Preoxygenation: Pre-oxygenation with 100% oxygen Induction Type: IV induction       

## 2017-07-11 NOTE — Transfer of Care (Signed)
Immediate Anesthesia Transfer of Care Note  Patient: Christina Cole  Procedure(s) Performed: Procedure(s): COLONOSCOPY WITH PROPOFOL (N/A)  Patient Location: PACU  Anesthesia Type:General  Level of Consciousness: sedated and responds to stimulation  Airway & Oxygen Therapy: Patient Spontanous Breathing and Patient connected to nasal cannula oxygen  Post-op Assessment: Report given to RN and Post -op Vital signs reviewed and stable  Post vital signs: Reviewed and stable  Last Vitals:  Vitals:   07/11/17 0921 07/11/17 1058  BP: (!) 161/90 117/61  Pulse:  68  Resp:  18  Temp:    SpO2:  100%    Last Pain:  Vitals:   07/11/17 0917  TempSrc: Tympanic         Complications: No apparent anesthesia complications

## 2017-07-11 NOTE — Anesthesia Preprocedure Evaluation (Signed)
Anesthesia Evaluation  Patient identified by MRN, date of birth, ID band Patient awake    Reviewed: Allergy & Precautions, NPO status , Patient's Chart, lab work & pertinent test results, reviewed documented beta blocker date and time   Airway Mallampati: II  TM Distance: >3 FB     Dental  (+) Chipped   Pulmonary           Cardiovascular hypertension, Pt. on medications      Neuro/Psych    GI/Hepatic   Endo/Other    Renal/GU Renal disease     Musculoskeletal  (+) Arthritis ,   Abdominal   Peds  Hematology  (+) anemia ,   Anesthesia Other Findings   Reproductive/Obstetrics                             Anesthesia Physical Anesthesia Plan  ASA: III  Anesthesia Plan: General   Post-op Pain Management:    Induction: Intravenous  PONV Risk Score and Plan:   Airway Management Planned:   Additional Equipment:   Intra-op Plan:   Post-operative Plan:   Informed Consent: I have reviewed the patients History and Physical, chart, labs and discussed the procedure including the risks, benefits and alternatives for the proposed anesthesia with the patient or authorized representative who has indicated his/her understanding and acceptance.     Plan Discussed with: CRNA  Anesthesia Plan Comments:         Anesthesia Quick Evaluation

## 2017-07-11 NOTE — Anesthesia Postprocedure Evaluation (Signed)
Anesthesia Post Note  Patient: Christina Cole  Procedure(s) Performed: Procedure(s) (LRB): COLONOSCOPY WITH PROPOFOL (N/A)  Patient location during evaluation: Endoscopy Anesthesia Type: General Level of consciousness: awake and alert Pain management: pain level controlled Vital Signs Assessment: post-procedure vital signs reviewed and stable Respiratory status: spontaneous breathing, nonlabored ventilation, respiratory function stable and patient connected to nasal cannula oxygen Cardiovascular status: blood pressure returned to baseline and stable Postop Assessment: no signs of nausea or vomiting Anesthetic complications: no     Last Vitals:  Vitals:   07/11/17 1118 07/11/17 1128  BP: (!) 155/87 (!) 164/78  Pulse: 70 64  Resp: 13 14  Temp:    SpO2: 99% 99%    Last Pain:  Vitals:   07/11/17 1058  TempSrc: Tympanic  PainSc: Asleep                 Mozell Hardacre S

## 2017-07-12 ENCOUNTER — Encounter: Payer: Self-pay | Admitting: Gastroenterology

## 2017-07-12 LAB — SURGICAL PATHOLOGY

## 2017-07-14 ENCOUNTER — Encounter: Payer: Self-pay | Admitting: Gastroenterology

## 2017-07-20 ENCOUNTER — Ambulatory Visit (INDEPENDENT_AMBULATORY_CARE_PROVIDER_SITE_OTHER): Payer: Medicare HMO

## 2017-07-20 VITALS — BP 136/70 | HR 67 | Temp 98.3°F | Resp 14 | Ht 63.0 in | Wt 153.0 lb

## 2017-07-20 DIAGNOSIS — Z0000 Encounter for general adult medical examination without abnormal findings: Secondary | ICD-10-CM | POA: Diagnosis not present

## 2017-07-20 NOTE — Patient Instructions (Addendum)
  Ms. Holzmann , Thank you for taking time to come for your Medicare Wellness Visit. I appreciate your ongoing commitment to your health goals. Please review the following plan we discussed and let me know if I can assist you in the future.   Follow up with Dr. Nicki Reaper as needed.    Have a great day!  These are the goals we discussed: Goals    . Increase physical activity          Walking for 30 minutes at least 3 days a week.       This is a list of the screening recommended for you and due dates:  Health Maintenance  Topic Date Due  . Mammogram  09/05/2017  . Tetanus Vaccine  10/31/2020  . Colon Cancer Screening  07/11/2022  . Flu Shot  Addressed  . DEXA scan (bone density measurement)  Completed  . Pneumonia vaccines  Completed

## 2017-07-20 NOTE — Progress Notes (Signed)
Subjective:   Christina Cole is a 76 y.o. female who presents for Medicare Annual (Subsequent) preventive examination.  Review of Systems:  No ROS.  Medicare Wellness Visit. Additional risk factors are reflected in the social history.  Cardiac Risk Factors include: advanced age (>67mn, >>52women);hypertension     Objective:     Vitals: BP 136/70 (BP Location: Left Arm, Patient Position: Sitting, Cuff Size: Normal)   Pulse 67   Temp 98.3 F (36.8 C) (Oral)   Resp 14   Ht _0  (1.6 m)   Wt 153 lb (69.4 kg)   LMP 12/21/1976   SpO2 97%   BMI 27.10 kg/m   Body mass index is 27.1 kg/m.   Tobacco History  Smoking Status  . Never Smoker  Smokeless Tobacco  . Never Used     Counseling given: Not Answered   Past Medical History:  Diagnosis Date  . Heart murmur   . Hyperlipidemia   . Hypertension   . Thyroid disease    Past Surgical History:  Procedure Laterality Date  . ABDOMINAL HYSTERECTOMY  1978   ovaries left in place  . APPENDECTOMY  1978  . COLONOSCOPY WITH PROPOFOL N/A 07/11/2017   Procedure: COLONOSCOPY WITH PROPOFOL;  Surgeon: WLucilla Lame MD;  [facility]  Service: Endoscopy;  Laterality: N/A;  . TUBAL LIGATION     Family History  Problem Relation Age of Onset  . Stroke Mother   . Hypertension Mother   . Arthritis Mother   . Cancer Sister        Breast  . Breast cancer Sister 557 . Cancer Sister        Breast  . Breast cancer Sister 63 . Lung cancer Brother   . Diabetes Brother    History  Sexual Activity  . Sexual activity: Not on file    Outpatient Encounter Prescriptions as of 07/20/2017  Medication Sig  . atorvastatin (LIPITOR) 40 MG tablet Take 1 tablet (40 mg total) by mouth daily.  .Marland Kitchendiltiazem (TIAZAC) 360 MG 24 hr capsule Take 1 capsule (360 mg total) by mouth daily.  .Marland KitchenEPINEPHrine (EPIPEN 2-PAK) 0.3 mg/0.3 mL IJ SOAJ injection USE AS DIRECTED  . EPINEPHrine (EPIPEN JR) 0.15 MG/0.3ML injection Inject 0.15 mg  into the muscle as needed for anaphylaxis.  .Marland Kitchenlevothyroxine (SYNTHROID, LEVOTHROID) 25 MCG tablet Take 25 mcg by mouth daily before breakfast.  . losartan (COZAAR) 100 MG tablet Take 1 tablet (100 mg total) by mouth daily.  . Polyvinyl Alcohol-Povidone (REFRESH OP) Apply to eye.  . [DISCONTINUED] metroNIDAZOLE (METROGEL VAGINAL) 0.75 % vaginal gel Place 1 Applicatorful vaginally 2 (two) times daily. One applicator bid for 5 days  . [DISCONTINUED] nystatin cream (MYCOSTATIN) Apply 1 application topically 2 (two) times daily.   No facility-administered encounter medications on file as of 07/20/2017.     Activities of Daily Living In your present state of health, do you have any difficulty performing the following activities: 07/20/2017  Hearing? N  Vision? N  Difficulty concentrating or making decisions? N  Walking or climbing stairs? N  Dressing or bathing? N  Doing errands, shopping? N  Preparing Food and eating ? N  Using the Toilet? N  In the past six months, have you accidently leaked urine? N  Do you have problems with loss of bowel control? N  Managing your Medications? N  Managing your Finances? N  Housekeeping or managing your Housekeeping? N  Some recent data might be  hidden    Patient Care Team: Einar Pheasant, MD as PCP - General (Internal Medicine) Lenard Simmer, MD as Attending Physician (Endocrinology)    Assessment:    This is a routine wellness examination for Christina Cole. The goal of the wellness visit is to assist the patient how to close the gaps in care and create a preventative care plan for the patient.   The roster of all physicians providing medical care to patient is listed in the Snapshot section of the chart.  Osteoporosis risk reviewed.    Safety issues reviewed; Smoke and carbon monoxide detectors in the home. No firearms in the home.  Wears seatbelts when driving or riding with others. Patient does wear sunscreen or protective clothing when in  direct sunlight. No violence in the home.  Depression- PHQ 2 &9 complete.  No signs/symptoms or verbal communication regarding little pleasure in doing things, feeling down, depressed or hopeless. No changes in sleeping, energy, eating, concentrating.  No thoughts of self harm or harm towards others.  Time spent on this topic is 9 minutes.   Patient is alert, normal appearance, oriented to person/place/and time.  Correctly identified the president of the [place], recall of 3/3 words, and performing simple calculations. Displays appropriate judgement and can read correct time from watch face.   No new identified risk were noted.  No failures at ADL's or IADL's.    BMI- discussed the importance of a healthy diet, water intake and the benefits of aerobic exercise. Educational material provided.   24 hour diet recall: Breakfast: grits, pork chop, egg  Lunch: green beans, mac and cheese, pork chop Dinner: banana sandwich Snack: 2-4 peanut butter crackers Daily fluid intake: 1 cups of caffeine, 6 cups of water  Dental- upper plate.  Eye- Visual acuity not assessed per patient preference since they have regular follow up with the ophthalmologist.  Wears corrective lenses.  Sleep patterns- Sleeps 6 hours at night.  Wakes feeling rested.  Health maintenance gaps- closed.  Patient Concerns: None at this time. Follow up with PCP as needed.  Exercise Activities and Dietary recommendations Current Exercise Habits: Home exercise routine, Type of exercise: walking, Time (Minutes): 20, Intensity: Mild  Goals    . Increase physical activity          Walking for 30 minutes at least 3 days a week.      Fall Risk Fall Risk  07/20/2017 08/26/2016 04/15/2016 04/08/2015 02/25/2015  Falls in the past year? _0    Depression Screen PHQ 2/9 Scores 07/20/2017 08/26/2016 04/15/2016 04/08/2015  PHQ - 2 Score 0 0 0 0  PHQ- 9 Score 0 - - -     Cognitive Function MMSE - Mini Mental State Exam  07/20/2017 02/25/2015  Orientation to time 5 5  Orientation to Place 5 5  Registration 3 3  Attention/ Calculation 5 5  Recall 3 3  Language- name 2 objects 2 2  Language- repeat 1 1  Language- follow 3 step command 3 3  Language- read & follow direction 1 1  Write a sentence 1 1  Copy design 1 1  Total score 30 30        Immunization History  Administered Date(s) Administered  . DTaP 12/21/2010  . Influenza Split 07/21/2013  . Influenza, High Dose Seasonal PF 07/18/2017  . Influenza-Unspecified 07/02/2014, 08/05/2015, 06/10/2016  . Pneumococcal Conjugate-13 12/22/2011  . Pneumococcal Polysaccharide-23 02/25/2015   Screening Tests Health Maintenance  Topic Date Due  .  MAMMOGRAM  09/05/2017  . TETANUS/TDAP  10/31/2020  . COLONOSCOPY  07/11/2022  . INFLUENZA VACCINE  Addressed  . DEXA SCAN  Completed  . PNA vac Low Risk Adult  Completed      Plan:   End of life planning; Advanced aging; Advanced directives discussed.  No HCPOA/Living Will.  Additional information declined at this time.  I have personally reviewed and noted the following in the patient's chart:   . Medical and social history . Use of alcohol, tobacco or illicit drugs  . Current medications and supplements . Functional ability and status . Nutritional status . Physical activity . Advanced directives . List of other physicians . Hospitalizations, surgeries, and ER visits in previous 12 months . Vitals . Screenings to include cognitive, depression, and falls . Referrals and appointments  In addition, I have reviewed and discussed with patient certain preventive protocols, quality metrics, and best practice recommendations. A written personalized care plan for preventive services as well as general preventive health recommendations were provided to patient.     Varney Biles, LPN  0/26/3785   Reviewed above information.  Agree with assessment and plan.  Dr Nicki Reaper

## 2017-07-31 ENCOUNTER — Other Ambulatory Visit: Payer: Self-pay | Admitting: Internal Medicine

## 2017-08-07 ENCOUNTER — Other Ambulatory Visit: Payer: Self-pay | Admitting: Internal Medicine

## 2017-08-07 DIAGNOSIS — Z1231 Encounter for screening mammogram for malignant neoplasm of breast: Secondary | ICD-10-CM

## 2017-09-06 ENCOUNTER — Ambulatory Visit
Admission: RE | Admit: 2017-09-06 | Discharge: 2017-09-06 | Disposition: A | Discharge status: Home / Self Care | Payer: Medicare HMO | Source: Ambulatory Visit | Attending: Internal Medicine | Admitting: Internal Medicine

## 2017-09-06 DIAGNOSIS — Z1231 Encounter for screening mammogram for malignant neoplasm of breast: Secondary | ICD-10-CM

## 2017-09-07 ENCOUNTER — Other Ambulatory Visit: Payer: Self-pay | Admitting: Internal Medicine

## 2017-09-07 DIAGNOSIS — R928 Other abnormal and inconclusive findings on diagnostic imaging of breast: Secondary | ICD-10-CM

## 2017-09-07 DIAGNOSIS — N6310 Unspecified lump in the right breast, unspecified quadrant: Secondary | ICD-10-CM

## 2017-09-07 DIAGNOSIS — N6489 Other specified disorders of breast: Secondary | ICD-10-CM

## 2017-09-07 NOTE — Progress Notes (Signed)
Orders placed for diagnostic mammogram and ultrasound both breasts

## 2017-09-08 ENCOUNTER — Telehealth: Payer: Self-pay

## 2017-09-08 NOTE — Telephone Encounter (Signed)
Copied from [place] #[id number]. Topic: Inquiry >> Sep 07, 2017  4:47 PM Cecelia Byars, NT wrote: Reason for CRM Patient returned call ,message left to call office back..she is unsure of reason for call

## 2017-09-08 NOTE — Telephone Encounter (Signed)
Spoke to patient see Mammogram results note.

## 2017-09-13 DIAGNOSIS — H25813 Combined forms of age-related cataract, bilateral: Secondary | ICD-10-CM | POA: Diagnosis not present

## 2017-09-13 DIAGNOSIS — H524 Presbyopia: Secondary | ICD-10-CM | POA: Diagnosis not present

## 2017-09-25 ENCOUNTER — Ambulatory Visit
Admission: RE | Admit: 2017-09-25 | Discharge: 2017-09-25 | Disposition: A | Discharge status: Home / Self Care | Payer: Medicare HMO | Source: Ambulatory Visit | Attending: Internal Medicine | Admitting: Internal Medicine

## 2017-09-25 ENCOUNTER — Ambulatory Visit: Payer: Medicare HMO | Admitting: Internal Medicine

## 2017-09-25 ENCOUNTER — Encounter: Payer: Self-pay | Admitting: Internal Medicine

## 2017-09-25 VITALS — BP 134/66 | HR 66 | Temp 98.4°F | Ht 63.0 in | Wt 150.0 lb

## 2017-09-25 DIAGNOSIS — R928 Other abnormal and inconclusive findings on diagnostic imaging of breast: Secondary | ICD-10-CM | POA: Diagnosis not present

## 2017-09-25 DIAGNOSIS — N6321 Unspecified lump in the left breast, upper outer quadrant: Secondary | ICD-10-CM | POA: Diagnosis not present

## 2017-09-25 DIAGNOSIS — R739 Hyperglycemia, unspecified: Secondary | ICD-10-CM

## 2017-09-25 DIAGNOSIS — E079 Disorder of thyroid, unspecified: Secondary | ICD-10-CM

## 2017-09-25 DIAGNOSIS — I10 Essential (primary) hypertension: Secondary | ICD-10-CM | POA: Diagnosis not present

## 2017-09-25 DIAGNOSIS — N6489 Other specified disorders of breast: Secondary | ICD-10-CM | POA: Diagnosis not present

## 2017-09-25 DIAGNOSIS — Z8371 Family history of colonic polyps: Secondary | ICD-10-CM | POA: Diagnosis not present

## 2017-09-25 DIAGNOSIS — R922 Inconclusive mammogram: Secondary | ICD-10-CM | POA: Diagnosis not present

## 2017-09-25 DIAGNOSIS — E7800 Pure hypercholesterolemia, unspecified: Secondary | ICD-10-CM

## 2017-09-25 DIAGNOSIS — N6323 Unspecified lump in the left breast, lower outer quadrant: Secondary | ICD-10-CM | POA: Insufficient documentation

## 2017-09-25 DIAGNOSIS — N6310 Unspecified lump in the right breast, unspecified quadrant: Secondary | ICD-10-CM

## 2017-09-25 NOTE — Progress Notes (Signed)
Patient ID: Christina Cole, female   DOB: 09/17/1941, 76 y.o.   MRN: [id number]   Subjective:    Patient ID: Christina Cole, female    DOB: 12-16-1940, 76 y.o.   MRN: [id number]  HPI  Patient here for a scheduled follow up.  She reports she is doing well.  Handling stress.  Does a lot of work at her church.  Stays active.  No chest pain.  No sob.  No acid reflux.  No abdominal pain.  Bowels doing well.  Just had colonoscopy 07/11/17.  Recommended f/u in 5 years.  Had mammogram 09/06/17.  Due f/u views today.     Past Medical History:  Diagnosis Date  . Heart murmur   . Hyperlipidemia   . Hypertension   . Thyroid disease    Past Surgical History:  Procedure Laterality Date  . ABDOMINAL HYSTERECTOMY  1978   ovaries left in place  . APPENDECTOMY  1978  . COLONOSCOPY WITH PROPOFOL N/A 07/11/2017   Procedure: COLONOSCOPY WITH PROPOFOL;  Surgeon: Lucilla Lame, MD;  Location: [facility];  Service: Endoscopy;  Laterality: N/A;  . TUBAL LIGATION     Family History  Problem Relation Age of Onset  . Stroke Mother   . Hypertension Mother   . Arthritis Mother   . Cancer Sister        Breast  . Breast cancer Sister 10  . Cancer Sister        Breast  . Breast cancer Sister 71  . Lung cancer Brother   . Diabetes Brother    Social History   Socioeconomic History  . Marital status: Single    Spouse name: None  . Number of children: 3  . Years of education: None  . Highest education level: None  Social Needs  . Financial resource strain: None  . Food insecurity - worry: None  . Food insecurity - inability: None  . Transportation needs - medical: None  . Transportation needs - non-medical: None  Occupational History  . None  Tobacco Use  . Smoking status: Never Smoker  . Smokeless tobacco: Never Used  Substance and Sexual Activity  . Alcohol use: No    Alcohol/week: 0.0 oz  . Drug use: No  . Sexual activity: None  Other Topics Concern  . None  Social History Narrative   . None    Outpatient Encounter Medications as of 09/25/2017  Medication Sig  . atorvastatin (LIPITOR) 40 MG tablet TAKE 1 TABLET EVERY DAY  . diltiazem (TIAZAC) 360 MG 24 hr capsule TAKE 1 CAPSULE EVERY DAY  . EPINEPHrine (EPIPEN 2-PAK) 0.3 mg/0.3 mL IJ SOAJ injection USE AS DIRECTED  . EPINEPHrine (EPIPEN JR) 0.15 MG/0.3ML injection Inject 0.15 mg into the muscle as needed for anaphylaxis.  Marland Kitchen levothyroxine (SYNTHROID, LEVOTHROID) 25 MCG tablet Take 25 mcg by mouth daily before breakfast.  . losartan (COZAAR) 100 MG tablet TAKE 1 TABLET EVERY DAY  . Polyvinyl Alcohol-Povidone (REFRESH OP) Apply to eye.   No facility-administered encounter medications on file as of 09/25/2017.     Review of Systems  Constitutional: Negative for appetite change and unexpected weight change.  HENT: Negative for congestion and sinus pressure.   Respiratory: Negative for cough, chest tightness and shortness of breath.   Cardiovascular: Negative for chest pain, palpitations and leg swelling.  Gastrointestinal: Negative for abdominal pain, diarrhea, nausea and vomiting.  Genitourinary: Negative for difficulty urinating and dysuria.  Musculoskeletal: Negative for joint swelling and myalgias.  Skin:  Negative for color change and rash.  Neurological: Negative for dizziness, light-headedness and headaches.  Psychiatric/Behavioral: Negative for agitation and dysphoric mood.       Handling stress.        Objective:     Blood pressure rechecked by me:  136/78  Physical Exam  Constitutional: She appears well-developed and well-nourished. No distress.  HENT:  Nose: Nose normal.  Mouth/Throat: Oropharynx is clear and moist.  Neck: Neck supple. No thyromegaly present.  Cardiovascular: Normal rate and regular rhythm.  Pulmonary/Chest: Breath sounds normal. No respiratory distress. She has no wheezes.  Abdominal: Soft. Bowel sounds are normal. There is no tenderness.  Musculoskeletal: She exhibits no edema  or tenderness.  Lymphadenopathy:    She has no cervical adenopathy.  Skin: No rash noted. No erythema.  Psychiatric: She has a normal mood and affect. Thought content normal.    BP 134/66 (BP Location: Left Arm, Patient Position: Sitting, Cuff Size: Normal)   Pulse 66   Temp 98.4 F (36.9 C) (Oral)   Ht 5\' 3"  (1.6 m)   Wt 150 lb (68 kg)   LMP 12/21/1976   SpO2 97%   BMI 26.57 kg/m  Wt Readings from Last 3 Encounters:  09/25/17 150 lb (68 kg)  07/20/17 153 lb (69.4 kg)  07/11/17 156 lb (70.8 kg)     Lab Results  Component Value Date   WBC 5.4 08/24/2016   HGB 13.2 08/24/2016   HCT 40.1 08/24/2016   PLT 235.0 08/24/2016   GLUCOSE 108 (H) 05/22/2017   CHOL 170 05/22/2017   TRIG 166.0 (H) 05/22/2017   HDL 46.90 05/22/2017   LDLCALC 90 05/22/2017   ALT 17 05/22/2017   AST 15 05/22/2017   NA 137 05/22/2017   K 4.1 05/22/2017   CL 105 05/22/2017   CREATININE 1.14 05/22/2017   BUN 12 05/22/2017   CO2 27 05/22/2017   TSH 1.69 05/22/2017    Mm Screening Breast Tomo Bilateral  Result Date: 09/06/2017 CLINICAL DATA:  Screening. EXAM: 2D DIGITAL SCREENING BILATERAL MAMMOGRAM WITH CAD AND ADJUNCT TOMO COMPARISON:  Previous exam(s). ACR Breast Density Category c: The breast tissue is heterogeneously dense, which may obscure small masses. FINDINGS: In the right breast , a possible mass requires further evaluation. In the left breast , a possible asymmetry requires further evaluation. Images were processed with CAD. IMPRESSION: Further evaluation is suggested for possible mass in the right breast. Further evaluation is suggested for possible asymmetry in the left breast. RECOMMENDATION: Diagnostic mammogram and possibly ultrasound of both breasts. (Code:[id number]) The patient will be contacted regarding the findings, and additional imaging will be scheduled. BI-RADS CATEGORY  0: Incomplete. Need additional imaging evaluation and/or prior mammograms for comparison. Electronically Signed    By: Kristopher Oppenheim M.D.   On: 09/06/2017 14:49       Assessment & Plan:   Problem List Items Addressed This Visit    Abnormal mammogram    Had mammogram (screening) 09/06/17 - Birads 0.  Recommended f/u views.  Scheduled today.       Essential hypertension, benign    Blood pressure has been under good control.  Continue same medication regimen.  Follow pressures.  Follow metabolic panel.        Relevant Orders   CBC with Differential/Platelet   Basic metabolic panel   Family history of polyps in the colon    Just had colonoscopy 07/11/17.  Recommended f/u in 5 years.        Hypercholesterolemia  On lipitor.  Low cholesterol diet and exercise.  Follow lipid panel and liver function tests.        Relevant Orders   Hepatic function panel   Lipid panel   Thyroid disease    Has been followed by Dr Carmela Rima.  On thyroid replacement.  05/22/17 - tsh wnl.         Other Visit Diagnoses    Hyperglycemia    -  Primary   Relevant Orders   Hemoglobin A1c       Einar Pheasant, MD

## 2017-09-26 ENCOUNTER — Encounter: Payer: Self-pay | Admitting: General Surgery

## 2017-09-26 ENCOUNTER — Encounter: Payer: Self-pay | Admitting: Internal Medicine

## 2017-09-26 ENCOUNTER — Other Ambulatory Visit: Payer: Self-pay | Admitting: Internal Medicine

## 2017-09-26 DIAGNOSIS — R928 Other abnormal and inconclusive findings on diagnostic imaging of breast: Secondary | ICD-10-CM | POA: Insufficient documentation

## 2017-09-26 NOTE — Assessment & Plan Note (Signed)
On lipitor.  Low cholesterol diet and exercise.  Follow lipid panel and liver function tests.   

## 2017-09-26 NOTE — Assessment & Plan Note (Signed)
Had mammogram (screening) 09/06/17 - Birads 0.  Recommended f/u views.  Scheduled today.

## 2017-09-26 NOTE — Progress Notes (Signed)
Order placed for referral to surgery - Dr Bary Castilla

## 2017-09-26 NOTE — Assessment & Plan Note (Signed)
Just had colonoscopy 07/11/17.  Recommended f/u in 5 years.

## 2017-09-26 NOTE — Assessment & Plan Note (Addendum)
Has been followed by Dr Carmela Rima.  On thyroid replacement.  05/22/17 - tsh wnl.

## 2017-09-26 NOTE — Assessment & Plan Note (Signed)
Blood pressure has been under good control.  Continue same medication regimen.  Follow pressures.  Follow metabolic panel.   

## 2017-09-27 ENCOUNTER — Telehealth: Payer: Self-pay | Admitting: General Surgery

## 2017-09-27 NOTE — Telephone Encounter (Signed)
I HAVE LEFT A MESSAGE FOR THE PATIENT TO CALL AND SEE IF SHE CAN COME IN Friday 09-29-17 TO SEE DR BYRNETT TO RESCHEDULE FROM 10-05-17.PLEASE CHECK DAY TO SEE IF THE TIME TO BE OFFERED IS 8:30AM OR THE 9:30AM.

## 2017-09-28 ENCOUNTER — Encounter: Payer: Self-pay | Admitting: *Deleted

## 2017-09-29 ENCOUNTER — Inpatient Hospital Stay: Payer: Self-pay

## 2017-09-29 ENCOUNTER — Ambulatory Visit: Payer: Medicare HMO | Admitting: General Surgery

## 2017-09-29 ENCOUNTER — Encounter: Payer: Self-pay | Admitting: General Surgery

## 2017-09-29 ENCOUNTER — Ambulatory Visit
Admission: RE | Admit: 2017-09-29 | Discharge: 2017-09-29 | Disposition: A | Discharge status: Home / Self Care | Payer: Medicare HMO | Source: Ambulatory Visit | Attending: General Surgery | Admitting: General Surgery

## 2017-09-29 VITALS — BP 136/72 | HR 76 | Resp 12 | Ht 63.0 in | Wt 149.0 lb

## 2017-09-29 DIAGNOSIS — N6312 Unspecified lump in the right breast, upper inner quadrant: Secondary | ICD-10-CM | POA: Diagnosis not present

## 2017-09-29 DIAGNOSIS — N6011 Diffuse cystic mastopathy of right breast: Secondary | ICD-10-CM | POA: Diagnosis not present

## 2017-09-29 DIAGNOSIS — N6002 Solitary cyst of left breast: Secondary | ICD-10-CM

## 2017-09-29 HISTORY — PX: BREAST BIOPSY: SHX20

## 2017-09-29 NOTE — Patient Instructions (Addendum)
CARE AFTER BREAST BIOPSY  1. Leave the dressing on that your doctor applied after the biopsy. It is waterproof. You may bathe, shower and/or swim. The dressing can be removed in 3 days, you will see small strips of tape against your skin on the incision. Do not remove these strips they will gradually fall off in about 2-3 weeks. You may use an ice pack on and off for the first 12-24 hours for comfort.  2. You may want to use a gauze,cloth or similar protection in your bra to prevent rubbing against your dressing and incision. This is not necessary, but you may feel more comfortable doing so.  3. It is recommended that you wear a bra day and night to give support to the breast. This will prevent the weight of the breast from pulling on the incision.  4. Your breast may feel hard and lumpy under the incision. Do not be alarmed. This is the underlying stitching of tissue. Softening of this tissue will occur in time.  5. You may have a follow up appointment or phone follow up in one week after your biopsy. The office phone number is [phone].  6. You will notice about a week or two after your office visit that the strips of the tape on your incision will begin to loosen. These may then be removed.  7. Report to your doctor any of the following:  * Severe pain not relieved by your pain medication  *Redness of the incision  * Drainage from the incision  *Fever greater than 101 degrees   Patient to have post biopsy right breast mammogram at [facility] today.

## 2017-09-29 NOTE — Progress Notes (Signed)
Patient ID: Christina Cole, female   DOB: 09/11/1941, 76 y.o.   MRN: [id number]  Chief Complaint  Patient presents with  . Follow-up    mammogram    HPI Christina Cole is a 76 y.o. female who presents for a breast evaluation. The most recent mammogram was done on 09/06/17. With added views and ultrasound of the left and right breast on 09/25/17. Patient does perform regular self breast checks and gets regular mammograms done. This was just her yearly mammogram she has not had any problems with the breasts. She does have 2 sisters with breast cancer.  She is here today with her daugher, Christina Cole.     HPI  Past Medical History:  Diagnosis Date  . Heart murmur   . Hyperlipidemia   . Hypertension   . Thyroid disease     Past Surgical History:  Procedure Laterality Date  . ABDOMINAL HYSTERECTOMY  1978   ovaries left in place  . APPENDECTOMY  1978  . COLONOSCOPY WITH PROPOFOL N/A 07/11/2017   Procedure: COLONOSCOPY WITH PROPOFOL;  Surgeon: Lucilla Lame, MD;  Location: [facility] ENDOSCOPY;  Service: Endoscopy;  Laterality: N/A;  . TUBAL LIGATION      Family History  Problem Relation Age of Onset  . Stroke Mother   . Hypertension Mother   . Arthritis Mother   . Breast cancer Sister 87  . Breast cancer Sister 25  . Lung cancer Brother   . Diabetes Brother     Social History Social History   Tobacco Use  . Smoking status: Never Smoker  . Smokeless tobacco: Never Used  Substance Use Topics  . Alcohol use: No    Alcohol/week: 0.0 oz  . Drug use: No    Allergies  Allergen Reactions  . Sulfa Antibiotics Rash    Current Outpatient Medications  Medication Sig Dispense Refill  . atorvastatin (LIPITOR) 40 MG tablet TAKE 1 TABLET EVERY DAY 90 tablet 3  . diltiazem (TIAZAC) 360 MG 24 hr capsule TAKE 1 CAPSULE EVERY DAY 90 capsule 3  . EPINEPHrine (EPIPEN 2-PAK) 0.3 mg/0.3 mL IJ SOAJ injection USE AS DIRECTED 2 Device 0  . EPINEPHrine (EPIPEN JR) 0.15 MG/0.3ML injection  Inject 0.15 mg into the muscle as needed for anaphylaxis.    Marland Kitchen levothyroxine (SYNTHROID, LEVOTHROID) 25 MCG tablet Take 25 mcg by mouth daily before breakfast.    . losartan (COZAAR) 100 MG tablet TAKE 1 TABLET EVERY DAY 90 tablet 3  . Polyvinyl Alcohol-Povidone (REFRESH OP) Apply to eye.     No current facility-administered medications for this visit.     Review of Systems Review of Systems  Constitutional: Negative.   Respiratory: Negative.   Cardiovascular: Negative.     Blood pressure 136/72, pulse 76, resp. rate 12, height 5\' 3"  (1.6 m), weight 149 lb (67.6 kg), last menstrual period 12/21/1976.  Physical Exam Physical Exam  Constitutional: She is oriented to person, place, and time. She appears well-developed and well-nourished.  Eyes: Conjunctivae are normal. No scleral icterus.  Neck: Neck supple.  Cardiovascular: Normal rate and regular rhythm.  Murmur heard.  Systolic murmur is present with a grade of 2/6. Pulmonary/Chest: Effort normal and breath sounds normal. Right breast exhibits no inverted nipple, no mass, no nipple discharge, no skin change and no tenderness. Left breast exhibits no inverted nipple, no mass, no nipple discharge, no skin change and no tenderness.    Multiple skin tags noted   Lymphadenopathy:    She has no cervical  adenopathy.    She has no axillary adenopathy.  Neurological: She is alert and oriented to person, place, and time.  Skin: Skin is warm and dry.  Psychiatric: She has a normal mood and affect.    Data Reviewed Screening mammograms of September 05, 2016 and September 06, 2017 were reviewed as well as diagnostic studies of September 25, 2017 including bilateral breast ultrasound.  Developing density in the retroareolar area of the right breast which on ultrasound measured up to 1.4 cm in diameter.  This was thought to represent an intraductal mass and a terminal duct or debris.  BI-RADS-4. Persistent 5 mm nodule in the left lateral breast  which on ultrasound was felt to represent a cyst but the possibility of a complex cyst could not be excluded.  BI-RADS-4.  The patient was amenable for evaluation of both lesions and possible biopsy.  Ultrasound examination of the right breast in the 3:00 location 1 cm from the nipple showed an ill-defined hypoechoic mass with focal areas of posterior acoustic enhancement measuring 0.55 x 0.87 x 1.29 cm.  No immediately adjacent ductal structures were appreciated.  The area was cleansed with alcohol and 10 cc of 0.5% Xylocaine with 0.25% Marcaine with 1-200,000 units of epinephrine was instilled and well-tolerated.  A 10-gauge Encor device was passed after skin cleansing with ChloraPrep.  This was from a medial to lateral direction.  The biopsy needle was placed in the center of the architectural distortion and 6 core samples were obtained.  Minimal residual distortion after biopsy.  A postbiopsy clip was placed.  There is no discernible bleeding.  The skin defect was closed with benzoin and Steri-Strip followed by Telfa and Tegaderm dressing.  Attention was turned to the left breast.  At the 3 o'clock position 4 cm from the nipple a slightly taller than wide near anechoic mass with posterior acoustic enhancement was noted.  This measured 0.56 x 0.59 x 0.60 cm.  The patient was amenable to FNA sampling.  The skin was cleansed with alcohol and a 22-gauge needle was used to puncture the lesion.  Aspiration showed complete resolution.  The fluid was discarded.  The patient tolerated both procedures well and was given written instructions in regards to wound care for the biopsy site.  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  Left breast cyst, asymptomatic, resolved on aspiration.    Plan    Arrangements have been made for postbiopsy mammogram of the right breast today.  The patient will be contacted when biopsy results are available  on Monday, October 02, 2017.    HPI, Physical Exam, Assessment and Plan have been scribed under the direction and in the presence of Robert Bellow, MD  Concepcion Living, LPN  I have completed the exam and reviewed the above documentation for accuracy and completeness.  I agree with the above.  Haematologist has been used and any errors in dictation or transcription are unintentional.  Hervey Ard, M.D., F.A.C.S.   Robert Bellow 09/29/2017, 10:25 AM

## 2017-10-02 ENCOUNTER — Telehealth: Payer: Self-pay | Admitting: General Surgery

## 2017-10-02 NOTE — Telephone Encounter (Signed)
Patient was notified that the biopsy results were fine.  In review of the post biopsy mammograms I believe there is good concordance.  Should why she developed breast density is a little unclear but at this time with the volume of tissue removed including resolution of the density previously noted I am comfortable that no malignancy has been overlooked.  The patient reports that she tolerated the procedure well.  Moderate soreness on the day of the procedure but steadily improving since then.  No bruising.  She will call if any concerns develop.  She can return to annual screening mammograms next year.

## 2017-10-03 ENCOUNTER — Other Ambulatory Visit: Payer: Medicare HMO

## 2017-10-03 ENCOUNTER — Other Ambulatory Visit (INDEPENDENT_AMBULATORY_CARE_PROVIDER_SITE_OTHER): Payer: Medicare HMO

## 2017-10-03 DIAGNOSIS — E7800 Pure hypercholesterolemia, unspecified: Secondary | ICD-10-CM | POA: Diagnosis not present

## 2017-10-03 DIAGNOSIS — R739 Hyperglycemia, unspecified: Secondary | ICD-10-CM | POA: Diagnosis not present

## 2017-10-03 DIAGNOSIS — I10 Essential (primary) hypertension: Secondary | ICD-10-CM | POA: Diagnosis not present

## 2017-10-03 LAB — LIPID PANEL
CHOLESTEROL: 176 mg/dL (ref 0–200)
HDL: 53.1 mg/dL (ref >39.00)
LDL CALC: 97 mg/dL (ref 0–99)
NonHDL: 122.82
TRIGLYCERIDES: 128 mg/dL (ref 0.0–149.0)
Total CHOL/HDL Ratio: 3
VLDL: 25.6 mg/dL (ref 0.0–40.0)

## 2017-10-03 LAB — HEPATIC FUNCTION PANEL
ALBUMIN: 4.4 g/dL (ref 3.5–5.2)
ALT: 15 U/L (ref 0–35)
AST: 16 U/L (ref 0–37)
Alkaline Phosphatase: 77 U/L (ref 39–117)
BILIRUBIN TOTAL: 1.1 mg/dL (ref 0.2–1.2)
Bilirubin, Direct: 0.2 mg/dL (ref 0.0–0.3)
TOTAL PROTEIN: 8.3 g/dL (ref 6.0–8.3)

## 2017-10-03 LAB — CBC WITH DIFFERENTIAL/PLATELET
BASOS PCT: 0.7 % (ref 0.0–3.0)
Basophils Absolute: 0 10*3/uL (ref 0.0–0.1)
EOS ABS: 0.1 10*3/uL (ref 0.0–0.7)
Eosinophils Relative: 2.6 % (ref 0.0–5.0)
HEMATOCRIT: 39 % (ref 36.0–46.0)
Hemoglobin: 12.5 g/dL (ref 12.0–15.0)
LYMPHS PCT: 43.3 % (ref 12.0–46.0)
Lymphs Abs: 2.4 10*3/uL (ref 0.7–4.0)
MCHC: 32 g/dL (ref 30.0–36.0)
MCV: 88 fl (ref 78.0–100.0)
MONOS PCT: 7.1 % (ref 3.0–12.0)
Monocytes Absolute: 0.4 10*3/uL (ref 0.1–1.0)
NEUTROS ABS: 2.5 10*3/uL (ref 1.4–7.7)
Neutrophils Relative %: 46.3 % (ref 43.0–77.0)
PLATELETS: 237 10*3/uL (ref 150.0–400.0)
RBC: 4.43 Mil/uL (ref 3.87–5.11)
RDW: 14.6 % (ref 11.5–15.5)
WBC: 5.5 10*3/uL (ref 4.0–10.5)

## 2017-10-03 LAB — BASIC METABOLIC PANEL
BUN: 10 mg/dL (ref 6–23)
CHLORIDE: 100 meq/L (ref 96–112)
CO2: 30 meq/L (ref 19–32)
CREATININE: 0.88 mg/dL (ref 0.40–1.20)
Calcium: 9.7 mg/dL (ref 8.4–10.5)
GFR: 80.69 mL/min (ref >60.00)
Glucose, Bld: 110 mg/dL — ABNORMAL HIGH (ref 70–99)
Potassium: 3.8 mEq/L (ref 3.5–5.1)
Sodium: 135 mEq/L (ref 135–145)

## 2017-10-03 LAB — HEMOGLOBIN A1C: HEMOGLOBIN A1C: 6.6 % — AB (ref 4.6–6.5)

## 2017-10-03 NOTE — Addendum Note (Signed)
Addended by: Arby Barrette on: 10/03/2017 09:43 AM   Modules accepted: Orders

## 2017-10-05 ENCOUNTER — Telehealth: Payer: Self-pay

## 2017-10-05 ENCOUNTER — Ambulatory Visit: Payer: Self-pay | Admitting: General Surgery

## 2017-10-05 NOTE — Telephone Encounter (Signed)
Called and spoke with patient.

## 2017-10-05 NOTE — Telephone Encounter (Signed)
Copied from [facility]cy #[id number]. Topic: General - Other >> Oct 05, 2017  2:16 PM Patrice Paradise wrote: Reason for CRM: Patient is requesting a call from the Lab supervisor. She stated that she was in there Tuesday, and there is a incident that happen while she was there. Patient did not elaborate any farther.

## 2017-10-06 ENCOUNTER — Ambulatory Visit (INDEPENDENT_AMBULATORY_CARE_PROVIDER_SITE_OTHER): Payer: Medicare HMO

## 2017-10-06 DIAGNOSIS — N6002 Solitary cyst of left breast: Secondary | ICD-10-CM

## 2017-10-06 DIAGNOSIS — N6312 Unspecified lump in the right breast, upper inner quadrant: Secondary | ICD-10-CM

## 2017-10-06 NOTE — Progress Notes (Signed)
Patient ID: Christina Cole, female   DOB: May 08, 1941, 76 y.o.   MRN: [id number] Patient came in today for a wound check.  The wound is clean, with no signs of infection noted. Steri strip removed. The patient is aware of her pathology. Follow up as scheduled.

## 2017-11-14 ENCOUNTER — Ambulatory Visit (INDEPENDENT_AMBULATORY_CARE_PROVIDER_SITE_OTHER): Payer: Medicare HMO | Admitting: Internal Medicine

## 2017-11-14 ENCOUNTER — Encounter: Payer: Self-pay | Admitting: Internal Medicine

## 2017-11-14 VITALS — BP 160/78 | HR 79 | Temp 98.2°F | Resp 16 | Ht 63.0 in | Wt 151.5 lb

## 2017-11-14 DIAGNOSIS — J069 Acute upper respiratory infection, unspecified: Secondary | ICD-10-CM

## 2017-11-14 DIAGNOSIS — E079 Disorder of thyroid, unspecified: Secondary | ICD-10-CM

## 2017-11-14 DIAGNOSIS — R490 Dysphonia: Secondary | ICD-10-CM | POA: Diagnosis not present

## 2017-11-14 NOTE — Patient Instructions (Addendum)
Please follow up in 2 weeks  Take care  Discuss with Dr. Ronnald Collum about your hoarseness  Try voice rest, warm salt water gargles, lozengers, claritin or zyrtec at night  Robitussin DM or Mucinex DM for cough  Try a humidifier as well    Goiter/enlarged thyroid A goiter is an enlarged thyroid gland. The thyroid gland is located in the lower front of the neck. The gland produces hormones that regulate mood, body temperature, pulse rate, and digestion. Most goiters are painless and are not a cause for serious concern. Goiters and conditions that cause goiters can be treated, if necessary. What are the causes? Causes of this condition include:  Diseases that attack healthy cells in your body (autoimmune diseases) and affect your thyroid function, such as: ? Graves disease. This causes too much thyroid hormone to be produced and it makes your thyroid overly active (hyperthyroidism). ? Hashimoto disease. This type of inflammation of the thyroid (thyroiditis) causes too little thyroid hormone to be produced and it makes your thyroid not active enough (hypothyroidism).  Other conditions that cause thyroiditis.  Nodular goiter. This means that there are one or more small growths on your thyroid. These can create too much thyroid hormone.  Pregnancy.  Thyroid cancer. This is rare.  Certain medicines.  Radiation exposure.  Iodine deficiency.  In some cases, the cause may not be known (idiopathic). What increases the risk? This condition is more likely to develop in:  People who have a family history of goiter.  Women.  People who do not get enough iodine in their diet.  People who are older than 74.  People who smoke tobacco.  What are the signs or symptoms? Common symptoms of this condition include:  Swelling in the lower part of the neck. This swelling can range from a very small bump to a large lump.  A tight feeling in the throat.  A hoarse voice.  Other symptoms  include:  Coughing.  Wheezing.  Difficulty swallowing.  Difficulty breathing.  Bulging neck veins.  Dizziness.  In some cases, there are no symptoms and thyroid hormone levels may be normal. When a goiter is the result of hyperthyroidism, symptoms may also include:  Nervousness or restlessness.  Inability to tolerate heat.  Unexplained weight loss.  Diarrhea.  Change in the texture of hair or skin.  Changes in heart beat, such as skipped beats, extra beats, or a rapid heart rate.  Loss of menstruation.  Shaky hands.  Increased appetite.  Sleep problems.  When a goiter is the result of hypothyroidism, symptoms may also include:  Feeling like you have no energy (lethargy).  Inability to tolerate cold.  Weight gain that is not explained by a change in diet or exercise habits.  Dry skin.  Coarse hair.  Menstrual irregularity.  Constipation.  Sadness or depression.  How is this diagnosed? This condition may be diagnosed with a medical history and physical exam. You may also have other tests, including:  Blood tests to check thyroid function.  Imaging tests, such as: ? Ultrasonography. ? CT scan. ? MRI. ? Thyroid scan. You will be given a safe radioactive injection, then images will be taken of your thyroid.  Tissue sample (biopsy) of the goiter or any nodules. This checks to see if the goiter or nodules are cancerous.  How is this treated? Treatment for this condition depends on the cause. Treatment may include:  Medicines to control your thyroid.  Anti-inflammatory or steroid medicines, if inflammation is the  cause.  Iodine supplements or changes in diet, if the goiter is caused by iodine deficiency.  Radiation therapy.  Surgery to remove your thyroid.  In some cases, no treatment is necessary, and your health care provider will monitor your condition at regular checkups. Follow these instructions at home:  Follow recommendations from  your health care provider for any changes to your diet.  Take over-the-counter and prescription medicines only as told by your health care provider.  Do not use any tobacco products, including cigarettes, chewing tobacco, or e-cigarettes. If you need help quitting, ask your health care provider.  Keep all follow-up appointments as told by your health care provider. This is important. Contact a health care provider if:  Your symptoms do not get better with treatment. Get help right away if:  You develop sudden, unexplained confusion or other mental changes.  You have nausea, vomiting, or diarrhea.  You develop a fever.  Your skin or the whites of your eyes appear yellow (jaundice).  You develop chest pain.  You have trouble breathing or swallowing.  You suddenly become very weak.  You experience extreme restlessness. This information is not intended to replace advice given to you by your health care provider. Make sure you discuss any questions you have with your health care provider. Document Released: 04/06/2010 Document Revised: 05/06/2016 Document Reviewed: 10/13/2014 Elsevier Interactive Patient Education  2018 [employer].   Hoarseness Hoarseness is any abnormal change in your voice.Hoarseness can make it difficult to speak. Your voice may sound raspy, breathy, or strained. Hoarseness is caused by a problem with the vocal cords. The vocal cords are two bands of tissue inside your voice box (larynx). When you speak, your vocal cords move back and forth to create sound. The surfaces of your vocal cords need to be smooth for your voice to sound clear. Swelling or lumps on the vocal cords can cause hoarseness. Common causes of vocal cord problems include:  Upper airway infection.  A long-term cough.  Straining or overusing your voice.  Smoking.  Allergies.  Vocal cord growths.  Stomach acids that flow up from your stomach and irritate your vocal cords  (gastroesophageal reflux).  Follow these instructions at home: Watch your condition for any changes. To ease any discomfort that you feel:  Rest your voice. Do not whisper. Whispering can cause muscle strain.  Do not speak in a loud or harsh voice that makes your hoarseness worse.  Do not use any tobacco products, including cigarettes, chewing tobacco, or electronic cigarettes. If you need help quitting, ask your health care provider.  Avoid secondhand smoke.  Do not eat foods that give you heartburn. Heartburn can make gastroesophageal reflux worse.  Do not drink coffee.  Do not drink alcohol.  Drink enough fluids to keep your urine clear or pale yellow.  Use a humidifier if the air in your home is dry.  Contact a health care provider if:  You have hoarseness that lasts longer than 3 weeks.  You almost lose or completelylose your voice for longer than 3 days.  You have pain when you swallow or try to talk.  You feel a lump in your neck. Get help right away if:  You have trouble swallowing.  You feel as though you are choking when you swallow.  You cough up blood or vomit blood.  You have trouble breathing. This information is not intended to replace advice given to you by your health care provider. Make sure you discuss any  questions you have with your health care provider. Document Released: 09/30/2005 Document Revised: 03/24/2016 Document Reviewed: 10/08/2014 Elsevier Interactive Patient Education  2018 [employer]  Laryngitis Laryngitis is inflammation of your vocal cords. This causes hoarseness, coughing, loss of voice, sore throat, or a dry throat. Your vocal cords are two bands of muscles that are found in your throat. When you speak, these cords come together and vibrate. These vibrations come out through your mouth as sound. When your vocal cords are inflamed, your voice sounds different. Laryngitis can be temporary (acute) or long-term (chronic). Most  cases of acute laryngitis improve with time. Chronic laryngitis is laryngitis that lasts for more than three weeks. What are the causes? Acute laryngitis may be caused by:  A viral infection.  Lots of talking, yelling, or singing. This is also called vocal strain.  Bacterial infections.  Chronic laryngitis may be caused by:  Vocal strain.  Injury to your vocal cords.  Acid reflux (gastroesophageal reflux disease or GERD).  Allergies.  Sinus infection.  Smoking.  Alcohol abuse.  Breathing in chemicals or dust.  Growths on the vocal cords.  What increases the risk? Risk factors for laryngitis include:  Smoking.  Alcohol abuse.  Having allergies.  What are the signs or symptoms? Symptoms of laryngitis may include:  Low, hoarse voice.  Loss of voice.  Dry cough.  Sore throat.  Stuffy nose.  How is this diagnosed? Laryngitis may be diagnosed by:  Physical exam.  Throat culture.  Blood test.  Laryngoscopy. This procedure allows your health care provider to look at your vocal cords with a mirror or viewing tube.  How is this treated? Treatment for laryngitis depends on what is causing it. Usually, treatment involves resting your voice and using medicines to soothe your throat. However, if your laryngitis is caused by a bacterial infection, you may need to take antibiotic medicine. If your laryngitis is caused by a growth, you may need to have a procedure to remove it. Follow these instructions at home:  Drink enough fluid to keep your urine clear or pale yellow.  Breathe in moist air. Use a humidifier if you live in a dry climate.  Take medicines only as directed by your health care provider.  If you were prescribed an antibiotic medicine, finish it all even if you start to feel better.  Do not smoke cigarettes or electronic cigarettes. If you need help quitting, ask your health care provider.  Talk as little as possible. Also avoid whispering,  which can cause vocal strain.  Write instead of talking. Do this until your voice is back to normal. Contact a health care provider if:  You have a fever.  You have increasing pain.  You have difficulty swallowing. Get help right away if:  You cough up blood.  You have trouble breathing. This information is not intended to replace advice given to you by your health care provider. Make sure you discuss any questions you have with your health care provider. Document Released: 10/17/2005 Document Revised: 03/24/2016 Document Reviewed: 04/01/2014 Elsevier Interactive Patient Education  [employer].

## 2017-11-14 NOTE — Progress Notes (Signed)
Chief Complaint  Patient presents with  . Hoarse   F/u  1. C/o hoarse voice since Saturday and scratchy throat, tried salt water Monday. Also ears itchy, dry cough when throat gets dry and intermittently sneezing. Denies sick contacts. She tried [place] x 1. No body aches, fever/chills/sinus issues/heartburn. This also happened as far as hoarse voice 07/2017. She reports she has seasonal allergies and denies postnasal drip. She also went to ED 01/2017 for URI 2. HTN elevated BP 164/86 had medications today 3. H/o thyroid goiter she follows with Dr. Ronnald Collum and has f/u upcoming. Advised hoarseness may be related to thyroid enlargement as well.     Review of Systems  Constitutional: Negative for chills and fever.  HENT:       +scratchy throat +hoarse voice  +ears itching Denies postnasal drip   Respiratory: Positive for cough. Negative for shortness of breath.   Cardiovascular: Negative for chest pain.  Gastrointestinal: Negative for heartburn.   Past Medical History:  Diagnosis Date  . Heart murmur   . Hyperlipidemia   . Hypertension   . Thyroid disease    goiter (follows with Dr. Ronnald Collum)   Past Surgical History:  Procedure Laterality Date  . ABDOMINAL HYSTERECTOMY  1978   ovaries left in place  . APPENDECTOMY  1978  . BREAST BIOPSY Right 09/29/2017   Pt had Bx  done in [facility]'s office and  was sent for post bx pictures  . COLONOSCOPY WITH PROPOFOL N/A 07/11/2017   Procedure: COLONOSCOPY WITH PROPOFOL;  Surgeon: Lucilla Lame, MD;  Location: [facility] ENDOSCOPY;  Service: Endoscopy;  Laterality: N/A;  . TUBAL LIGATION     Family History  Problem Relation Age of Onset  . Stroke Mother   . Hypertension Mother   . Arthritis Mother   . Breast cancer Sister 51  . Breast cancer Sister 55  . Lung cancer Brother   . Diabetes Brother    Social History   Socioeconomic History  . Marital status: Single    Spouse name: Not on file  . Number of children: 3  . Years of  education: Not on file  . Highest education level: Not on file  Social Needs  . Financial resource strain: Not on file  . Food insecurity - worry: Not on file  . Food insecurity - inability: Not on file  . Transportation needs - medical: Not on file  . Transportation needs - non-medical: Not on file  Occupational History  . Not on file  Tobacco Use  . Smoking status: Never Smoker  . Smokeless tobacco: Never Used  Substance and Sexual Activity  . Alcohol use: No    Alcohol/week: 0.0 oz  . Drug use: No  . Sexual activity: Not on file  Other Topics Concern  . Not on file  Social History Narrative  . Not on file   No outpatient medications have been marked as taking for the 11/14/17 encounter (Office Visit) with McLean-Scocuzza, Nino Glow, MD.   Allergies  Allergen Reactions  . Sulfa Antibiotics Rash   Recent Results (from the past 2160 hour(s))  Lipid Profile     Status: None   Collection Time: 10/03/17 10:32 AM  Result Value Ref Range   Cholesterol 176 0 - 200 mg/dL    Comment: ATP III Classification       Desirable:  < 200 mg/dL               Borderline High:  200 - 239 mg/dL  High:  > = 240 mg/dL   Triglycerides 128.0 0.0 - 149.0 mg/dL    Comment: Normal:  <150 mg/dLBorderline High:  150 - 199 mg/dL   HDL 53.10 >39.00 mg/dL   VLDL 25.6 0.0 - 40.0 mg/dL   LDL Cholesterol 97 0 - 99 mg/dL   Total CHOL/HDL Ratio 3     Comment:                Men          Women1/2 Average Risk     3.4          3.3Average Risk          5.0          4.42X Average Risk          9.6          7.13X Average Risk          15.0          11.0                       NonHDL 122.82     Comment: NOTE:  Non-HDL goal should be 30 mg/dL higher than patient's LDL goal (i.e. LDL goal of < 70 mg/dL, would have non-HDL goal of < 100 mg/dL)  Hepatic function panel     Status: None   Collection Time: 10/03/17 10:32 AM  Result Value Ref Range   Total Bilirubin 1.1 0.2 - 1.2 mg/dL   Bilirubin, Direct 0.2 0.0 -  0.3 mg/dL   Alkaline Phosphatase 77 39 - 117 U/L   AST 16 0 - 37 U/L   ALT 15 0 - 35 U/L   Total Protein 8.3 6.0 - 8.3 g/dL   Albumin 4.4 3.5 - 5.2 g/dL  Hemoglobin A1C     Status: Abnormal   Collection Time: 10/03/17 10:32 AM  Result Value Ref Range   Hgb A1c MFr Bld 6.6 (H) 4.6 - 6.5 %    Comment: Glycemic Control Guidelines for People with Diabetes:Non Diabetic:  <6%Goal of Therapy: <7%Additional Action Suggested:  >7%   Basic Metabolic Panel (BMET)     Status: Abnormal   Collection Time: 10/03/17 10:32 AM  Result Value Ref Range   Sodium 135 135 - 145 mEq/L   Potassium 3.8 3.5 - 5.1 mEq/L   Chloride 100 96 - 112 mEq/L   CO2 30 19 - 32 mEq/L   Glucose, Bld 110 (H) 70 - 99 mg/dL   BUN 10 6 - 23 mg/dL   Creatinine, Ser 0.88 0.40 - 1.20 mg/dL   Calcium 9.7 8.4 - 10.5 mg/dL   GFR 80.69 >60.00 mL/min  CBC w/Diff     Status: None   Collection Time: 10/03/17 10:32 AM  Result Value Ref Range   WBC 5.5 4.0 - 10.5 K/uL   RBC 4.43 3.87 - 5.11 Mil/uL   Hemoglobin 12.5 12.0 - 15.0 g/dL   HCT 39.0 36.0 - 46.0 %   MCV 88.0 78.0 - 100.0 fl   MCHC 32.0 30.0 - 36.0 g/dL   RDW 14.6 11.5 - 15.5 %   Platelets 237.0 150.0 - 400.0 K/uL   Neutrophils Relative % 46.3 43.0 - 77.0 %   Lymphocytes Relative 43.3 12.0 - 46.0 %   Monocytes Relative 7.1 3.0 - 12.0 %   Eosinophils Relative 2.6 0.0 - 5.0 %   Basophils Relative 0.7 0.0 - 3.0 %   Neutro Abs 2.5 1.4 - 7.7  K/uL   Lymphs Abs 2.4 0.7 - 4.0 K/uL   Monocytes Absolute 0.4 0.1 - 1.0 K/uL   Eosinophils Absolute 0.1 0.0 - 0.7 K/uL   Basophils Absolute 0.0 0.0 - 0.1 K/uL   Objective  Body mass index is 26.84 kg/m. Wt Readings from Last 3 Encounters:  11/14/17 151 lb 8 oz (68.7 kg)  09/29/17 149 lb (67.6 kg)  09/25/17 150 lb (68 kg)   Temp Readings from Last 3 Encounters:  11/14/17 98.2 F (36.8 C) (Oral)  09/25/17 98.4 F (36.9 C) (Oral)  07/20/17 98.3 F (36.8 C) (Oral)   BP Readings from Last 3 Encounters:  11/14/17 (!) 164/86   09/29/17 136/72  09/25/17 134/66   Pulse Readings from Last 3 Encounters:  11/14/17 79  09/29/17 76  09/25/17 66   O2 sat 97% room air  Physical Exam  Constitutional: She is oriented to person, place, and time and well-developed, well-nourished, and in no distress.  HENT:  Head: Normocephalic and atraumatic.  Right Ear: External ear normal.  Left Ear: External ear normal.  Nose: Nose normal.  Mouth/Throat: Mucous membranes are normal. Posterior oropharyngeal erythema present.  Mild erythema throat   Eyes: Conjunctivae are normal. Pupils are equal, round, and reactive to light.  Cardiovascular: Normal rate, regular rhythm and normal heart sounds.  Pulmonary/Chest: Effort normal and breath sounds normal.  Neurological: She is alert and oriented to person, place, and time. Gait normal. Gait normal.  Skin: Skin is warm, dry and intact.  Psychiatric: Mood, memory, affect and judgment normal.  Nursing note and vitals reviewed.   Assessment   1. Hoarseness could be related to viral URI vs thyroid goiter  2. HTN uncontrolled today normally controlled  3. Thyroid d/o  Plan  1. Supportive care f/u in 2 weeks advise endocrine about hoarse she is on thyroid medication x <1 year 2. Cont meds, log BP check in 2 weeks if elevated add diuretic low dose as she was on spironolactone in the past and also triamterine 35/hctz 25.  Consider adding just hctz low dose at f/u  3. Disc with Dr. Ronnald Collum upcoming #1 to see if he thinks related.  Provider: Dr. Olivia Mackie McLean-Scocuzza-Internal Medicine

## 2017-11-29 ENCOUNTER — Ambulatory Visit: Payer: Medicare HMO | Admitting: Internal Medicine

## 2017-12-14 DIAGNOSIS — E039 Hypothyroidism, unspecified: Secondary | ICD-10-CM | POA: Diagnosis not present

## 2017-12-14 DIAGNOSIS — E049 Nontoxic goiter, unspecified: Secondary | ICD-10-CM | POA: Diagnosis not present

## 2017-12-21 DIAGNOSIS — E785 Hyperlipidemia, unspecified: Secondary | ICD-10-CM | POA: Diagnosis not present

## 2017-12-21 DIAGNOSIS — E039 Hypothyroidism, unspecified: Secondary | ICD-10-CM | POA: Diagnosis not present

## 2017-12-21 DIAGNOSIS — I10 Essential (primary) hypertension: Secondary | ICD-10-CM | POA: Diagnosis not present

## 2017-12-21 DIAGNOSIS — E049 Nontoxic goiter, unspecified: Secondary | ICD-10-CM | POA: Diagnosis not present

## 2017-12-21 DIAGNOSIS — E041 Nontoxic single thyroid nodule: Secondary | ICD-10-CM | POA: Diagnosis not present

## 2018-01-23 ENCOUNTER — Encounter: Payer: Self-pay | Admitting: Internal Medicine

## 2018-01-23 ENCOUNTER — Ambulatory Visit (INDEPENDENT_AMBULATORY_CARE_PROVIDER_SITE_OTHER): Payer: Medicare HMO | Admitting: Internal Medicine

## 2018-01-23 ENCOUNTER — Other Ambulatory Visit (HOSPITAL_COMMUNITY)
Admission: RE | Admit: 2018-01-23 | Discharge: 2018-01-23 | Disposition: A | Discharge status: Home / Self Care | Payer: Medicare HMO | Source: Ambulatory Visit | Attending: Internal Medicine | Admitting: Internal Medicine

## 2018-01-23 VITALS — BP 138/72 | HR 63 | Temp 98.1°F | Resp 16 | Wt 149.6 lb

## 2018-01-23 DIAGNOSIS — Z124 Encounter for screening for malignant neoplasm of cervix: Secondary | ICD-10-CM

## 2018-01-23 DIAGNOSIS — E7800 Pure hypercholesterolemia, unspecified: Secondary | ICD-10-CM | POA: Diagnosis not present

## 2018-01-23 DIAGNOSIS — I10 Essential (primary) hypertension: Secondary | ICD-10-CM | POA: Diagnosis not present

## 2018-01-23 DIAGNOSIS — D649 Anemia, unspecified: Secondary | ICD-10-CM

## 2018-01-23 DIAGNOSIS — N760 Acute vaginitis: Secondary | ICD-10-CM

## 2018-01-23 DIAGNOSIS — R739 Hyperglycemia, unspecified: Secondary | ICD-10-CM

## 2018-01-23 DIAGNOSIS — B373 Candidiasis of vulva and vagina: Secondary | ICD-10-CM | POA: Diagnosis not present

## 2018-01-23 DIAGNOSIS — R928 Other abnormal and inconclusive findings on diagnostic imaging of breast: Secondary | ICD-10-CM | POA: Diagnosis not present

## 2018-01-23 MED ORDER — NYSTATIN 100000 UNIT/GM EX CREA: 1.0000 "application " | TOPICAL_CREAM | Freq: Two times a day (BID) | CUTANEOUS | 0 refills | Status: DC

## 2018-01-23 NOTE — Progress Notes (Signed)
Patient ID: Christina Cole, female   DOB: 1941-01-06, 77 y.o.   MRN: [id number]   Subjective:    Patient ID: Christina Cole, female    DOB: 1941/01/30, 77 y.o.   MRN: [id number]  HPI  Patient here for a scheduled follow up.  She reports she is doing well.  Has felt good.  Stays active.  No chest pain.  No sob.  No acid reflux.  No abdominal pain.  Bowels moving.  No urine change.  Does report some vaginal itching.  States has been present for two weeks.  Outside of vagina - red.  Was using a bath oil.  Has stopped.   Concerned it may have irritated.  No vaginal discharge.  No dysuria.     Past Medical History:  Diagnosis Date  . Heart murmur   . Hyperlipidemia   . Hypertension   . Thyroid disease    goiter (follows with Dr. Ronnald Collum)   Past Surgical History:  Procedure Laterality Date  . ABDOMINAL HYSTERECTOMY  1978   ovaries left in place  . APPENDECTOMY  1978  . BREAST BIOPSY Right 09/29/2017   Pt had Bx  done in [facility]'s office and  was sent for post bx pictures  . COLONOSCOPY WITH PROPOFOL N/A 07/11/2017   Procedure: COLONOSCOPY WITH PROPOFOL;  Surgeon: Lucilla Lame, MD;  Location: [facility] ENDOSCOPY;  Service: Endoscopy;  Laterality: N/A;  . TUBAL LIGATION     Family History  Problem Relation Age of Onset  . Stroke Mother   . Hypertension Mother   . Arthritis Mother   . Breast cancer Sister 71  . Breast cancer Sister 63  . Lung cancer Brother   . Diabetes Brother    Social History   Socioeconomic History  . Marital status: Single    Spouse name: Not on file  . Number of children: 3  . Years of education: Not on file  . Highest education level: Not on file  Occupational History  . Not on file  Social Needs  . Financial resource strain: Not on file  . Food insecurity:    Worry: Not on file    Inability: Not on file  . Transportation needs:    Medical: Not on file    Non-medical: Not on file  Tobacco Use  . Smoking status: Never Smoker  . Smokeless tobacco:  Never Used  Substance and Sexual Activity  . Alcohol use: No    Alcohol/week: 0.0 oz  . Drug use: No  . Sexual activity: Not on file  Lifestyle  . Physical activity:    Days per week: Not on file    Minutes per session: Not on file  . Stress: Not on file  Relationships  . Social connections:    Talks on phone: Not on file    Gets together: Not on file    Attends religious service: Not on file    Active member of club or organization: Not on file    Attends meetings of clubs or organizations: Not on file    Relationship status: Not on file  Other Topics Concern  . Not on file  Social History Narrative  . Not on file    Outpatient Encounter Medications as of 01/23/2018  Medication Sig  . atorvastatin (LIPITOR) 40 MG tablet TAKE 1 TABLET EVERY DAY  . diltiazem (TIAZAC) 360 MG 24 hr capsule TAKE 1 CAPSULE EVERY DAY  . EPINEPHrine (EPIPEN 2-PAK) 0.3 mg/0.3 mL IJ SOAJ injection USE AS  DIRECTED  . EPINEPHrine (EPIPEN JR) 0.15 MG/0.3ML injection Inject 0.15 mg into the muscle as needed for anaphylaxis.  Marland Kitchen levothyroxine (SYNTHROID, LEVOTHROID) 25 MCG tablet Take 25 mcg by mouth daily before breakfast.  . losartan (COZAAR) 100 MG tablet TAKE 1 TABLET EVERY DAY  . Polyvinyl Alcohol-Povidone (REFRESH OP) Apply to eye.  . nystatin cream (MYCOSTATIN) Apply 1 application topically 2 (two) times daily. Apply to affected area (on vagina) bid   No facility-administered encounter medications on file as of 01/23/2018.     Review of Systems  Constitutional: Negative for appetite change, fever and unexpected weight change.  HENT: Negative for congestion and sinus pressure.   Respiratory: Negative for cough, chest tightness and shortness of breath.   Cardiovascular: Negative for chest pain, palpitations and leg swelling.  Gastrointestinal: Negative for abdominal pain, diarrhea, nausea and vomiting.  Genitourinary: Negative for difficulty urinating and dysuria.       Vaginal irritation and  redness.    Musculoskeletal: Negative for joint swelling and myalgias.  Skin: Negative for color change.       Vaginal redness.    Neurological: Negative for dizziness, light-headedness and headaches.  Psychiatric/Behavioral: Negative for agitation and dysphoric mood.       Objective:     Blood pressure rechecked by me:  138-140/72  Physical Exam  Constitutional: She appears well-developed and well-nourished. No distress.  HENT:  Nose: Nose normal.  Mouth/Throat: Oropharynx is clear and moist.  Neck: Neck supple.  Cardiovascular: Normal rate and regular rhythm.  Pulmonary/Chest: Breath sounds normal. No respiratory distress. She has no wheezes.  Abdominal: Soft. Bowel sounds are normal. There is no tenderness.  Genitourinary:  Genitourinary Comments: Normal external genitalia.  Minimal erythema - intravaginal area.  Some yeast present.  Vaginal vault without lesions.   Discharge present.  KOH/wet prep obtained.  Could not appreciate any adnexal masses or tenderness.    Musculoskeletal: She exhibits no edema or tenderness.  Lymphadenopathy:    She has no cervical adenopathy.  Skin: No rash noted. No erythema.  Psychiatric: She has a normal mood and affect. Her behavior is normal.    BP 138/72   Pulse 63   Temp 98.1 F (36.7 C) (Oral)   Resp 16   Wt 149 lb 9.6 oz (67.9 kg)   LMP 12/21/1976   SpO2 96%   BMI 26.50 kg/m  Wt Readings from Last 3 Encounters:  01/23/18 149 lb 9.6 oz (67.9 kg)  11/14/17 151 lb 8 oz (68.7 kg)  09/29/17 149 lb (67.6 kg)     Lab Results  Component Value Date   WBC 5.5 10/03/2017   HGB 12.5 10/03/2017   HCT 39.0 10/03/2017   PLT 237.0 10/03/2017   GLUCOSE 110 (H) 10/03/2017   CHOL 176 10/03/2017   TRIG 128.0 10/03/2017   HDL 53.10 10/03/2017   LDLCALC 97 10/03/2017   ALT 15 10/03/2017   AST 16 10/03/2017   NA 135 10/03/2017   K 3.8 10/03/2017   CL 100 10/03/2017   CREATININE 0.88 10/03/2017   BUN 10 10/03/2017   CO2 30 10/03/2017     TSH 1.69 05/22/2017   HGBA1C 6.6 (H) 10/03/2017    US Breast Complete Uni Left Inc Axilla  Result Date: 09/29/2017 Please refer to "Notes" to see consult details.  US Breast Complete Uni Right Inc Axilla  Result Date: 09/29/2017 Please refer to "Notes" to see consult details.  Mm Clip Placement Right  Result Date: 10/02/2017 The patient has undergone  vacuum biopsy of the density in the retroareolar area of the right breast.  Postbiopsy imaging shows an air cavity at the site of the density in the clip approximately 1 cm posterior consistent with the 10-gauge biopsy sample.      Assessment & Plan:   Problem List Items Addressed This Visit    Abnormal mammogram    S/p biopsy - Dr Bary Castilla.  Ok.  Recommended continued f/u with yearly screening mammogram.        Anemia    Saw GI.  Felt no further w/up warranted.  Follow cbc.       Essential hypertension, benign    Blood pressure on recheck improved.  Follow pressures.  Follow metabolic panel.        Relevant Orders   TSH   Basic metabolic panel   Hypercholesterolemia    On lipitor.  Low cholesterol diet and exercise.  Follow lipid panel and liver function tests.        Relevant Orders   Hepatic function panel   Lipid panel   Vaginitis    Nystatin cream externally.  KOH/wet prep sent.  Await results.         Other Visit Diagnoses    Cervical cancer screening    -  Primary   Relevant Orders   Cervicovaginal ancillary only (Completed)   Hyperglycemia       Relevant Orders   Hemoglobin A1c       Einar Pheasant, MD

## 2018-01-24 LAB — CERVICOVAGINAL ANCILLARY ONLY: WET PREP (BD AFFIRM): POSITIVE — AB

## 2018-01-28 ENCOUNTER — Encounter: Payer: Self-pay | Admitting: Internal Medicine

## 2018-01-28 DIAGNOSIS — N760 Acute vaginitis: Secondary | ICD-10-CM | POA: Insufficient documentation

## 2018-01-28 NOTE — Assessment & Plan Note (Signed)
Nystatin cream externally.  KOH/wet prep sent.  Await results.

## 2018-01-28 NOTE — Assessment & Plan Note (Signed)
S/p biopsy - Dr Bary Castilla.  Ok.  Recommended continued f/u with yearly screening mammogram.

## 2018-01-28 NOTE — Assessment & Plan Note (Signed)
On lipitor.  Low cholesterol diet and exercise.  Follow lipid panel and liver function tests.   

## 2018-01-28 NOTE — Assessment & Plan Note (Signed)
Saw GI.  Felt no further w/up warranted.  Follow cbc.

## 2018-01-28 NOTE — Assessment & Plan Note (Signed)
Blood pressure on recheck improved.  Follow pressures.  Follow metabolic panel.  

## 2018-02-07 DIAGNOSIS — R21 Rash and other nonspecific skin eruption: Secondary | ICD-10-CM | POA: Diagnosis not present

## 2018-02-26 ENCOUNTER — Other Ambulatory Visit (INDEPENDENT_AMBULATORY_CARE_PROVIDER_SITE_OTHER): Payer: Medicare HMO

## 2018-02-26 DIAGNOSIS — R739 Hyperglycemia, unspecified: Secondary | ICD-10-CM | POA: Diagnosis not present

## 2018-02-26 DIAGNOSIS — I10 Essential (primary) hypertension: Secondary | ICD-10-CM

## 2018-02-26 DIAGNOSIS — E7800 Pure hypercholesterolemia, unspecified: Secondary | ICD-10-CM

## 2018-02-26 LAB — HEPATIC FUNCTION PANEL
ALT: 12 U/L (ref 0–35)
AST: 12 U/L (ref 0–37)
Albumin: 4.3 g/dL (ref 3.5–5.2)
Alkaline Phosphatase: 74 U/L (ref 39–117)
Bilirubin, Direct: 0.2 mg/dL (ref 0.0–0.3)
Total Bilirubin: 0.9 mg/dL (ref 0.2–1.2)
Total Protein: 7.5 g/dL (ref 6.0–8.3)

## 2018-02-26 LAB — BASIC METABOLIC PANEL
BUN: 11 mg/dL (ref 6–23)
CALCIUM: 9.5 mg/dL (ref 8.4–10.5)
CO2: 25 mEq/L (ref 19–32)
Chloride: 102 mEq/L (ref 96–112)
Creatinine, Ser: 0.79 mg/dL (ref 0.40–1.20)
GFR: 91.29 mL/min (ref >60.00)
GLUCOSE: 96 mg/dL (ref 70–99)
POTASSIUM: 4.2 meq/L (ref 3.5–5.1)
SODIUM: 138 meq/L (ref 135–145)

## 2018-02-26 LAB — HEMOGLOBIN A1C: Hgb A1c MFr Bld: 6.6 % — ABNORMAL HIGH (ref 4.6–6.5)

## 2018-02-26 LAB — TSH: TSH: 1.07 u[IU]/mL (ref 0.35–4.50)

## 2018-02-27 ENCOUNTER — Ambulatory Visit (INDEPENDENT_AMBULATORY_CARE_PROVIDER_SITE_OTHER): Payer: Medicare HMO | Admitting: Internal Medicine

## 2018-02-27 DIAGNOSIS — I10 Essential (primary) hypertension: Secondary | ICD-10-CM | POA: Diagnosis not present

## 2018-02-27 DIAGNOSIS — E119 Type 2 diabetes mellitus without complications: Secondary | ICD-10-CM | POA: Diagnosis not present

## 2018-02-27 DIAGNOSIS — E7800 Pure hypercholesterolemia, unspecified: Secondary | ICD-10-CM | POA: Diagnosis not present

## 2018-02-27 LAB — LIPID PANEL
CHOLESTEROL: 180 mg/dL (ref 0–200)
HDL: 56.7 mg/dL (ref >39.00)
LDL Cholesterol: 101 mg/dL — ABNORMAL HIGH (ref 0–99)
NONHDL: 123.64
Total CHOL/HDL Ratio: 3
Triglycerides: 115 mg/dL (ref 0.0–149.0)
VLDL: 23 mg/dL (ref 0.0–40.0)

## 2018-02-27 NOTE — Progress Notes (Signed)
Patient ID: Christina Cole, female   DOB: Sep 27, 1941, 77 y.o.   MRN: [id number]   Subjective:    Patient ID: Christina Cole, female    DOB: [birth year], 77 y.o.   MRN: [id number]  HPI  Patient here for a scheduled follow up.  She reports she feels good.  Tries to stay active.  No chest pain.  No sob.  No acid reflux. No abdominal pain.  Bowels moving.  The previous vaginal irritation - resolved.  Outside blood pressure checks range 120-130s/70-80s.  Overall feels good.     Past Medical History:  Diagnosis Date  . Heart murmur   . Hyperlipidemia   . Hypertension   . Thyroid disease    goiter (follows with Dr. Ronnald Collum)   Past Surgical History:  Procedure Laterality Date  . ABDOMINAL HYSTERECTOMY  1978   ovaries left in place  . APPENDECTOMY  1978  . BREAST BIOPSY Right 09/29/2017   Pt had Bx  done in [facility]'s office and  was sent for post bx pictures  . COLONOSCOPY WITH PROPOFOL N/A 07/11/2017   Procedure: COLONOSCOPY WITH PROPOFOL;  Surgeon: Lucilla Lame, MD;  Location: [facility] ENDOSCOPY;  Service: Endoscopy;  Laterality: N/A;  . TUBAL LIGATION     Family History  Problem Relation Age of Onset  . Stroke Mother   . Hypertension Mother   . Arthritis Mother   . Breast cancer Sister 19  . Breast cancer Sister 37  . Lung cancer Brother   . Diabetes Brother    Social History   Socioeconomic History  . Marital status: Single    Spouse name: Not on file  . Number of children: 3  . Years of education: Not on file  . Highest education level: Not on file  Occupational History  . Not on file  Social Needs  . Financial resource strain: Not on file  . Food insecurity:    Worry: Not on file    Inability: Not on file  . Transportation needs:    Medical: Not on file    Non-medical: Not on file  Tobacco Use  . Smoking status: Never Smoker  . Smokeless tobacco: Never Used  Substance and Sexual Activity  . Alcohol use: No    Alcohol/week: 0.0 oz  . Drug use: No  . Sexual  activity: Not on file  Lifestyle  . Physical activity:    Days per week: Not on file    Minutes per session: Not on file  . Stress: Not on file  Relationships  . Social connections:    Talks on phone: Not on file    Gets together: Not on file    Attends religious service: Not on file    Active member of club or organization: Not on file    Attends meetings of clubs or organizations: Not on file    Relationship status: Not on file  Other Topics Concern  . Not on file  Social History Narrative  . Not on file    Outpatient Encounter Medications as of 02/27/2018  Medication Sig  . atorvastatin (LIPITOR) 40 MG tablet TAKE 1 TABLET EVERY DAY  . diltiazem (TIAZAC) 360 MG 24 hr capsule TAKE 1 CAPSULE EVERY DAY  . EPINEPHrine (EPIPEN 2-PAK) 0.3 mg/0.3 mL IJ SOAJ injection USE AS DIRECTED  . EPINEPHrine (EPIPEN JR) 0.15 MG/0.3ML injection Inject 0.15 mg into the muscle as needed for anaphylaxis.  Marland Kitchen levothyroxine (SYNTHROID, LEVOTHROID) 25 MCG tablet Take 25 mcg by mouth daily  before breakfast.  . losartan (COZAAR) 100 MG tablet TAKE 1 TABLET EVERY DAY  . nystatin cream (MYCOSTATIN) Apply 1 application topically 2 (two) times daily. Apply to affected area (on vagina) bid  . Polyvinyl Alcohol-Povidone (REFRESH OP) Apply to eye.   No facility-administered encounter medications on file as of 02/27/2018.     Review of Systems  Constitutional: Negative for appetite change and unexpected weight change.  HENT: Negative for congestion and sinus pressure.   Respiratory: Negative for cough, chest tightness and shortness of breath.   Cardiovascular: Negative for chest pain, palpitations and leg swelling.  Gastrointestinal: Negative for abdominal pain, diarrhea, nausea and vomiting.  Genitourinary: Negative for difficulty urinating and dysuria.  Musculoskeletal: Negative for joint swelling and myalgias.  Skin: Negative for color change and rash.  Neurological: Negative for dizziness,  light-headedness and headaches.  Psychiatric/Behavioral: Negative for agitation and dysphoric mood.       Objective:    Physical Exam  Constitutional: She appears well-developed and well-nourished. No distress.  HENT:  Nose: Nose normal.  Mouth/Throat: Oropharynx is clear and moist.  Neck: Neck supple. No thyromegaly present.  Cardiovascular: Normal rate and regular rhythm.  Pulmonary/Chest: Breath sounds normal. No respiratory distress. She has no wheezes.  Abdominal: Soft. Bowel sounds are normal. There is no tenderness.  Musculoskeletal: She exhibits no edema or tenderness.  Lymphadenopathy:    She has no cervical adenopathy.  Skin: No rash noted. No erythema.  Psychiatric: She has a normal mood and affect. Her behavior is normal.    BP (!) 146/82 (BP Location: Left Arm, Patient Position: Sitting, Cuff Size: Normal)   Pulse 79   Temp 97.6 F (36.4 C) (Oral)   Resp 16   Wt 149 lb (67.6 kg)   LMP 12/21/1976   SpO2 96%   BMI 26.39 kg/m  Wt Readings from Last 3 Encounters:  02/27/18 149 lb (67.6 kg)  01/23/18 149 lb 9.6 oz (67.9 kg)  11/14/17 151 lb 8 oz (68.7 kg)     Lab Results  Component Value Date   WBC 5.5 10/03/2017   HGB 12.5 10/03/2017   HCT 39.0 10/03/2017   PLT 237.0 10/03/2017   GLUCOSE 96 02/26/2018   CHOL 180 02/26/2018   TRIG 115.0 02/26/2018   HDL 56.70 02/26/2018   LDLCALC 101 (H) 02/26/2018   ALT 12 02/26/2018   AST 12 02/26/2018   NA 138 02/26/2018   K 4.2 02/26/2018   CL 102 02/26/2018   CREATININE 0.79 02/26/2018   BUN 11 02/26/2018   CO2 25 02/26/2018   TSH 1.07 02/26/2018   HGBA1C 6.6 (H) 02/26/2018       Assessment & Plan:   Problem List Items Addressed This Visit    Diabetes mellitus without complication ([place])    Low carb diet and exercise.  Follow met b and a1c.        Relevant Orders   Hemoglobin N1B   Basic metabolic panel   Microalbumin / creatinine urine ratio   Essential hypertension, benign    Blood pressure on  recheck improved.  Her checks under good control.  Same medication regimen.  Follow pressures.  Follow metabolic panel.       Hypercholesterolemia    On lipitor.  Low cholesterol diet and exercise.  Follow lipid panel and liver function tests.   Lab Results  Component Value Date   CHOL 180 02/26/2018   HDL 56.70 02/26/2018   LDLCALC 101 (H) 02/26/2018   TRIG 115.0 02/26/2018  CHOLHDL 3 02/26/2018        Relevant Orders   Hepatic function panel   Lipid panel       Einar Pheasant, MD

## 2018-03-05 ENCOUNTER — Encounter: Payer: Self-pay | Admitting: Internal Medicine

## 2018-03-05 DIAGNOSIS — E1165 Type 2 diabetes mellitus with hyperglycemia: Secondary | ICD-10-CM | POA: Insufficient documentation

## 2018-03-05 DIAGNOSIS — E119 Type 2 diabetes mellitus without complications: Secondary | ICD-10-CM | POA: Insufficient documentation

## 2018-03-05 NOTE — Assessment & Plan Note (Signed)
Low carb diet and exercise.  Follow met b and a1c.   

## 2018-03-05 NOTE — Assessment & Plan Note (Signed)
On lipitor.  Low cholesterol diet and exercise.  Follow lipid panel and liver function tests.   Lab Results  Component Value Date   CHOL 180 02/26/2018   HDL 56.70 02/26/2018   LDLCALC 101 (H) 02/26/2018   TRIG 115.0 02/26/2018   CHOLHDL 3 02/26/2018

## 2018-03-05 NOTE — Assessment & Plan Note (Signed)
Blood pressure on recheck improved.  Her checks under good control.  Same medication regimen.  Follow pressures.  Follow metabolic panel.

## 2018-04-24 ENCOUNTER — Telehealth: Payer: Self-pay

## 2018-04-24 NOTE — Telephone Encounter (Signed)
Scheduled with julie on Thursday. Patient was okay with this.

## 2018-04-24 NOTE — Telephone Encounter (Signed)
Copied from [place] [id number]. Topic: Appointment Scheduling - Scheduling Inquiry for Clinic >> Apr 24, 2018 10:02 AM Lennox Solders wrote: Reason for CRM: pt is having right hip pain for over a month. Pt decline to see another provider

## 2018-04-25 NOTE — Progress Notes (Signed)
Subjective:    Patient ID: Christina Cole, female    DOB: 1941/09/19, 77 y.o.   MRN: [id number]  HPI  Christina Cole is a 77 year old female who presents today with left hip pain that has been present for approximately one month. She states that this occurred after sitting on a stool for an extended period of time while painting outside.  Pain has improved but remains present.   Pain is described as aching.  Rated between 4 to 5. Pain with sitting: Yes Pain with movement from from sitting to standing position: Yes  Morning pain and stiffness: Yes Intolerance or pain with sleeping: Previously pain present when lying on her side but this is not longer present.  Numbness or tingling: No History of trauma or injury: No  Treatment: Tylenol provided excellent benefit. She has tried this approximately 2 times.  Aggravating factor: Sitting Alleviating factor: Pain with movement from sitting to standing but improves after standing and walking.    Review of Systems  Constitutional: Negative for chills, fatigue and fever.  Respiratory: Negative for cough, shortness of breath and wheezing.   Cardiovascular: Negative for chest pain and palpitations.  Genitourinary: Negative for dysuria, frequency and urgency.  Musculoskeletal: Negative for myalgias.       Left sided hip/buttock pain  Skin: Negative for rash.  Neurological: Negative for dizziness and weakness.   Past Medical History:  Diagnosis Date  . Heart murmur   . Hyperlipidemia   . Hypertension   . Thyroid disease    goiter (follows with Dr. Ronnald Collum)     Social History   Socioeconomic History  . Marital status: Single    Spouse name: Not on file  . Number of children: 3  . Years of education: Not on file  . Highest education level: Not on file  Occupational History  . Not on file  Social Needs  . Financial resource strain: Not on file  . Food insecurity:    Worry: Not on file    Inability: Not on file  . Transportation  needs:    Medical: Not on file    Non-medical: Not on file  Tobacco Use  . Smoking status: Former Research scientist (life sciences)  . Smokeless tobacco: Never Used  Substance and Sexual Activity  . Alcohol use: No    Alcohol/week: 0.0 oz  . Drug use: No  . Sexual activity: Not on file  Lifestyle  . Physical activity:    Days per week: Not on file    Minutes per session: Not on file  . Stress: Not on file  Relationships  . Social connections:    Talks on phone: Not on file    Gets together: Not on file    Attends religious service: Not on file    Active member of club or organization: Not on file    Attends meetings of clubs or organizations: Not on file    Relationship status: Not on file  . Intimate partner violence:    Fear of current or ex partner: Not on file    Emotionally abused: Not on file    Physically abused: Not on file    Forced sexual activity: Not on file  Other Topics Concern  . Not on file  Social History Narrative  . Not on file    Past Surgical History:  Procedure Laterality Date  . ABDOMINAL HYSTERECTOMY  1978   ovaries left in place  . APPENDECTOMY  1978  . BREAST BIOPSY Right  09/29/2017   Pt had Bx  done in [facility]'s office and  was sent for post bx pictures  . COLONOSCOPY WITH PROPOFOL N/A 07/11/2017   Procedure: COLONOSCOPY WITH PROPOFOL;  Surgeon: Lucilla Lame, MD;  Location: [facility] ENDOSCOPY;  Service: Endoscopy;  Laterality: N/A;  . TUBAL LIGATION      Family History  Problem Relation Age of Onset  . Stroke Mother   . Hypertension Mother   . Arthritis Mother   . Breast cancer Sister 75  . Breast cancer Sister 11  . Lung cancer Brother   . Diabetes Brother     Allergies  Allergen Reactions  . Sulfa Antibiotics Rash    Current Outpatient Medications on File Prior to Visit  Medication Sig Dispense Refill  . atorvastatin (LIPITOR) 40 MG tablet TAKE 1 TABLET EVERY DAY 90 tablet 3  . diltiazem (TIAZAC) 360 MG 24 hr capsule TAKE 1 CAPSULE EVERY DAY 90 capsule 3   . EPINEPHrine (EPIPEN 2-PAK) 0.3 mg/0.3 mL IJ SOAJ injection USE AS DIRECTED 2 Device 0  . EPINEPHrine (EPIPEN JR) 0.15 MG/0.3ML injection Inject 0.15 mg into the muscle as needed for anaphylaxis.    Marland Kitchen levothyroxine (SYNTHROID, LEVOTHROID) 25 MCG tablet Take 25 mcg by mouth daily before breakfast.    . losartan (COZAAR) 100 MG tablet TAKE 1 TABLET EVERY DAY 90 tablet 3  . nystatin cream (MYCOSTATIN) Apply 1 application topically 2 (two) times daily. Apply to affected area (on vagina) bid 30 g 0  . Polyvinyl Alcohol-Povidone (REFRESH OP) Apply to eye.     No current facility-administered medications on file prior to visit.     BP 134/82 (BP Location: Left Arm, Patient Position: Sitting, Cuff Size: Normal)   Pulse 67   Temp 98.4 F (36.9 C) (Oral)   Wt 149 lb 4 oz (67.7 kg)   LMP 12/21/1976   SpO2 98%   BMI 26.44 kg/m       Objective:   Physical Exam  Constitutional: She is oriented to person, place, and time. She appears well-developed and well-nourished.  Eyes: Pupils are equal, round, and reactive to light. No scleral icterus.  Neck: Neck supple.  Cardiovascular: Normal rate, regular rhythm and intact distal pulses.  Pulses:      Dorsalis pedis pulses are 2+ on the right side, and 2+ on the left side.       Posterior tibial pulses are 2+ on the right side, and 2+ on the left side.  Pulmonary/Chest: Effort normal and breath sounds normal.  Abdominal: Soft. Bowel sounds are normal. There is no tenderness.  Musculoskeletal:  Spine with normal alignment and no deformity. No tenderness to vertebral process with palpation with the exception of mild tenderness of left buttock.  Paraspinous muscles are not tender. ROM is full at lumbar sacral regions. Tenderness of left buttock exhibited with resistance. Negative Straight Leg raise. No CVA tenderness present. Able to heel/toe walk without pain. Sensation intact  Lymphadenopathy:    She has no cervical adenopathy.  Neurological: She  is alert and oriented to person, place, and time. She has normal strength. No sensory deficit. Gait normal.  Reflex Scores:      Patellar reflexes are 2+ on the right side and 2+ on the left side. Skin: Skin is warm and dry. Capillary refill takes less than 2 seconds.  Psychiatric: She has a normal mood and affect. Her behavior is normal. Judgment and thought content normal.      Assessment & Plan:  1.  Sciatica of left side Symptoms are improving however remain present. We discussed symptoms which may be multifactorial relating to piriformis syndrome and osteoarthritis. As improvement is occurring without prior treatment, we reviewed options and she will initiate conservative therapy of acetaminophen and use of ice or heat for symptoms. If improvement does not continue or symptoms worsen, we further discussed options of PT and imaging if needed. She will also wear supportive, comfortable shoes. Close follow up instructions provided. She will let us know if no improvement.  Delano Metz, FNP-C

## 2018-04-26 ENCOUNTER — Encounter: Payer: Self-pay | Admitting: Family Medicine

## 2018-04-26 ENCOUNTER — Ambulatory Visit (INDEPENDENT_AMBULATORY_CARE_PROVIDER_SITE_OTHER): Payer: Medicare HMO | Admitting: Family Medicine

## 2018-04-26 VITALS — BP 134/82 | HR 67 | Temp 98.4°F | Wt 149.2 lb

## 2018-04-26 DIAGNOSIS — M5432 Sciatica, left side: Secondary | ICD-10-CM

## 2018-04-26 NOTE — Patient Instructions (Addendum)
It was a pleasure to meet you today. Please use acetaminophen (Tylenol) as directed on package. Trial of ice or heat as described below. If symptoms do not improve, worsen, or new symptoms occur, follow up for further evaluation .   Sciatica Sciatica is pain, numbness, weakness, or tingling along your sciatic nerve. The sciatic nerve starts in the lower back and goes down the back of each leg. Sciatica happens when this nerve is pinched or has pressure put on it. Sciatica usually goes away on its own or with treatment. Sometimes, sciatica may keep coming back (recur). Follow these instructions at home: Medicines  Take over-the-counter and prescription medicines only as told by your doctor.  Do not drive or use heavy machinery while taking prescription pain medicine. Managing pain  If directed, put ice on the affected area. ? Put ice in a plastic bag. ? Place a towel between your skin and the bag. ? Leave the ice on for 20 minutes, 2-3 times a day.  After icing, apply heat to the affected area before you exercise or as often as told by your doctor. Use the heat source that your doctor tells you to use, such as a moist heat pack or a heating pad. ? Place a towel between your skin and the heat source. ? Leave the heat on for 20-30 minutes. ? Remove the heat if your skin turns bright red. This is especially important if you are unable to feel pain, heat, or cold. You may have a greater risk of getting burned. Activity  Return to your normal activities as told by your doctor. Ask your doctor what activities are safe for you. ? Avoid activities that make your sciatica worse.  Take short rests during the day. Rest in a lying or standing position. This is usually better than sitting to rest. ? When you rest for a long time, do some physical activity or stretching between periods of rest. ? Avoid sitting for a long time without moving. Get up and move around at least one time each  hour.  Exercise and stretch regularly, as told by your doctor.  Do not lift anything that is heavier than 10 lb (4.5 kg) while you have symptoms of sciatica. ? Avoid lifting heavy things even when you do not have symptoms. ? Avoid lifting heavy things over and over.  When you lift objects, always lift in a way that is safe for your body. To do this, you should: ? Bend your knees. ? Keep the object close to your body. ? Avoid twisting. General instructions  Use good posture. ? Avoid leaning forward when you are sitting. ? Avoid hunching over when you are standing.  Stay at a healthy weight.  Wear comfortable shoes that support your feet. Avoid wearing high heels.  Avoid sleeping on a mattress that is too soft or too hard. You might have less pain if you sleep on a mattress that is firm enough to support your back.  Keep all follow-up visits as told by your doctor. This is important. Contact a doctor if:  You have pain that: ? Wakes you up when you are sleeping. ? Gets worse when you lie down. ? Is worse than the pain you have had in the past. ? Lasts longer than 4 weeks.  You lose weight for without trying. Get help right away if:  You cannot control when you pee (urinate) or poop (have a bowel movement).  You have weakness in any of these  areas and it gets worse. ? Lower back. ? Lower belly (pelvis). ? Butt (buttocks). ? Legs.  You have redness or swelling of your back.  You have a burning feeling when you pee. This information is not intended to replace advice given to you by your health care provider. Make sure you discuss any questions you have with your health care provider. Document Released: 07/26/2008 Document Revised: 03/24/2016 Document Reviewed: 06/26/2015 Elsevier Interactive Patient Education  [employer].

## 2018-07-09 ENCOUNTER — Other Ambulatory Visit (INDEPENDENT_AMBULATORY_CARE_PROVIDER_SITE_OTHER): Payer: Medicare HMO

## 2018-07-09 DIAGNOSIS — E7800 Pure hypercholesterolemia, unspecified: Secondary | ICD-10-CM | POA: Diagnosis not present

## 2018-07-09 DIAGNOSIS — E119 Type 2 diabetes mellitus without complications: Secondary | ICD-10-CM

## 2018-07-09 LAB — BASIC METABOLIC PANEL
BUN: 11 mg/dL (ref 6–23)
CHLORIDE: 103 meq/L (ref 96–112)
CO2: 27 mEq/L (ref 19–32)
CREATININE: 0.94 mg/dL (ref 0.40–1.20)
Calcium: 9.5 mg/dL (ref 8.4–10.5)
GFR: 74.63 mL/min (ref >60.00)
GLUCOSE: 101 mg/dL — AB (ref 70–99)
Potassium: 4.3 mEq/L (ref 3.5–5.1)
Sodium: 137 mEq/L (ref 135–145)

## 2018-07-09 LAB — HEPATIC FUNCTION PANEL
ALT: 15 U/L (ref 0–35)
AST: 13 U/L (ref 0–37)
Albumin: 4.3 g/dL (ref 3.5–5.2)
Alkaline Phosphatase: 71 U/L (ref 39–117)
BILIRUBIN DIRECT: 0.1 mg/dL (ref 0.0–0.3)
TOTAL PROTEIN: 7.8 g/dL (ref 6.0–8.3)
Total Bilirubin: 0.8 mg/dL (ref 0.2–1.2)

## 2018-07-09 LAB — LIPID PANEL
CHOL/HDL RATIO: 3
Cholesterol: 166 mg/dL (ref 0–200)
HDL: 51.2 mg/dL (ref >39.00)
LDL CALC: 88 mg/dL (ref 0–99)
NonHDL: 114.52
Triglycerides: 131 mg/dL (ref 0.0–149.0)
VLDL: 26.2 mg/dL (ref 0.0–40.0)

## 2018-07-09 LAB — MICROALBUMIN / CREATININE URINE RATIO
CREATININE, U: 10.3 mg/dL
Microalb Creat Ratio: 6.8 mg/g (ref 0.0–30.0)
Microalb, Ur: <0.7 mg/dL (ref 0.0–1.9)

## 2018-07-09 LAB — HEMOGLOBIN A1C: Hgb A1c MFr Bld: 6.6 % — ABNORMAL HIGH (ref 4.6–6.5)

## 2018-07-10 ENCOUNTER — Other Ambulatory Visit: Payer: Medicare HMO

## 2018-07-11 ENCOUNTER — Ambulatory Visit (INDEPENDENT_AMBULATORY_CARE_PROVIDER_SITE_OTHER): Payer: Medicare HMO | Admitting: Internal Medicine

## 2018-07-11 VITALS — BP 118/62 | HR 58 | Temp 98.1°F | Resp 18 | Wt 150.0 lb

## 2018-07-11 DIAGNOSIS — D649 Anemia, unspecified: Secondary | ICD-10-CM

## 2018-07-11 DIAGNOSIS — E119 Type 2 diabetes mellitus without complications: Secondary | ICD-10-CM | POA: Diagnosis not present

## 2018-07-11 DIAGNOSIS — Z1231 Encounter for screening mammogram for malignant neoplasm of breast: Secondary | ICD-10-CM | POA: Diagnosis not present

## 2018-07-11 DIAGNOSIS — I10 Essential (primary) hypertension: Secondary | ICD-10-CM

## 2018-07-11 DIAGNOSIS — E7800 Pure hypercholesterolemia, unspecified: Secondary | ICD-10-CM

## 2018-07-11 DIAGNOSIS — Z0000 Encounter for general adult medical examination without abnormal findings: Secondary | ICD-10-CM

## 2018-07-11 DIAGNOSIS — E079 Disorder of thyroid, unspecified: Secondary | ICD-10-CM

## 2018-07-11 DIAGNOSIS — Z1239 Encounter for other screening for malignant neoplasm of breast: Secondary | ICD-10-CM

## 2018-07-11 LAB — HM DIABETES FOOT EXAM

## 2018-07-11 NOTE — Progress Notes (Addendum)
Subjective:    Patient ID: Christina Cole, female    DOB: April 11, 1941, 77 y.o.   MRN: [id number]  HPI  Patient here for her physical exam.   She reports she is doing relatively well.  Has a history of thyroid nodules.  Sees Dr Carmela Rima.  Has f/u planned in 07/2018.  Tries to stay active.  No chest pain.  No sob.  No acid relfux. No abdominal pain.  Bowels moving.  No urine change.     Past Medical History:  Diagnosis Date  . Heart murmur   . Hyperlipidemia   . Hypertension   . Thyroid disease    goiter (follows with Dr. Ronnald Collum)   Past Surgical History:  Procedure Laterality Date  . ABDOMINAL HYSTERECTOMY  1978   ovaries left in place  . APPENDECTOMY  1978  . BREAST BIOPSY Right 09/29/2017   Pt had Bx  done in [facility]'s office and  was sent for post bx pictures  . COLONOSCOPY WITH PROPOFOL N/A 07/11/2017   Procedure: COLONOSCOPY WITH PROPOFOL;  Surgeon: Lucilla Lame, MD;  Location: [facility] ENDOSCOPY;  Service: Endoscopy;  Laterality: N/A;  . TUBAL LIGATION     Family History  Problem Relation Age of Onset  . Stroke Mother   . Hypertension Mother   . Arthritis Mother   . Breast cancer Sister 63  . Breast cancer Sister 87  . Lung cancer Brother   . Diabetes Brother    Social History   Socioeconomic History  . Marital status: Single    Spouse name: Not on file  . Number of children: 3  . Years of education: Not on file  . Highest education level: Not on file  Occupational History  . Not on file  Social Needs  . Financial resource strain: Not on file  . Food insecurity:    Worry: Not on file    Inability: Not on file  . Transportation needs:    Medical: Not on file    Non-medical: Not on file  Tobacco Use  . Smoking status: Former Research scientist (life sciences)  . Smokeless tobacco: Never Used  Substance and Sexual Activity  . Alcohol use: No    Alcohol/week: 0.0 standard drinks  . Drug use: No  . Sexual activity: Not on file  Lifestyle  . Physical activity:    Days per week: Not  on file    Minutes per session: Not on file  . Stress: Not on file  Relationships  . Social connections:    Talks on phone: Not on file    Gets together: Not on file    Attends religious service: Not on file    Active member of club or organization: Not on file    Attends meetings of clubs or organizations: Not on file    Relationship status: Not on file  Other Topics Concern  . Not on file  Social History Narrative  . Not on file    Outpatient Encounter Medications as of 07/11/2018  Medication Sig  . atorvastatin (LIPITOR) 40 MG tablet TAKE 1 TABLET EVERY DAY  . diltiazem (TIAZAC) 360 MG 24 hr capsule TAKE 1 CAPSULE EVERY DAY  . EPINEPHrine (EPIPEN 2-PAK) 0.3 mg/0.3 mL IJ SOAJ injection USE AS DIRECTED  . levothyroxine (SYNTHROID, LEVOTHROID) 25 MCG tablet Take 25 mcg by mouth daily before breakfast.  . losartan (COZAAR) 100 MG tablet TAKE 1 TABLET EVERY DAY  . nystatin cream (MYCOSTATIN) Apply 1 application topically 2 (two) times daily. Apply to  affected area (on vagina) bid  . Polyvinyl Alcohol-Povidone (REFRESH OP) Apply to eye.  . [DISCONTINUED] EPINEPHrine (EPIPEN JR) 0.15 MG/0.3ML injection Inject 0.15 mg into the muscle as needed for anaphylaxis.   No facility-administered encounter medications on file as of 07/11/2018.     Review of Systems  Constitutional: Negative for appetite change and unexpected weight change.  HENT: Negative for congestion and sinus pressure.   Eyes: Negative for pain and visual disturbance.  Respiratory: Negative for cough, chest tightness and shortness of breath.   Cardiovascular: Negative for chest pain, palpitations and leg swelling.  Gastrointestinal: Negative for abdominal pain, diarrhea, nausea and vomiting.  Genitourinary: Negative for difficulty urinating and dysuria.  Musculoskeletal: Negative for joint swelling and myalgias.  Skin: Negative for color change and rash.  Neurological: Negative for dizziness, light-headedness and  headaches.  Hematological: Negative for adenopathy. Does not bruise/bleed easily.  Psychiatric/Behavioral: Negative for agitation and dysphoric mood.       Objective:    Physical Exam  Constitutional: She is oriented to person, place, and time. She appears well-developed and well-nourished. No distress.  HENT:  Nose: Nose normal.  Mouth/Throat: Oropharynx is clear and moist.  Eyes: Right eye exhibits no discharge. Left eye exhibits no discharge. No scleral icterus.  Neck: Neck supple. No thyromegaly present.  Cardiovascular: Normal rate and regular rhythm.  Pulmonary/Chest: Breath sounds normal. No accessory muscle usage. No tachypnea. No respiratory distress. She has no decreased breath sounds. She has no wheezes. She has no rhonchi. Right breast exhibits no inverted nipple, no mass, no nipple discharge and no tenderness (no axillary adenopathy). Left breast exhibits no inverted nipple, no mass, no nipple discharge and no tenderness (no axilarry adenopathy).  Abdominal: Soft. Bowel sounds are normal. There is no tenderness.  Musculoskeletal: She exhibits no edema or tenderness.  Feet:  No lesions.  DP pulses palpable and equal bilaterally.  Sensation intact to light touch and pinprick.    Lymphadenopathy:    She has no cervical adenopathy.  Neurological: She is alert and oriented to person, place, and time.  Skin: No rash noted. No erythema.  Psychiatric: She has a normal mood and affect. Her behavior is normal.    BP 118/62 (BP Location: Left Arm, Patient Position: Sitting, Cuff Size: Normal)   Pulse (!) 58   Temp 98.1 F (36.7 C) (Oral)   Resp 18   Wt 150 lb (68 kg)   LMP 12/21/1976   SpO2 97%   BMI 26.57 kg/m  Wt Readings from Last 3 Encounters:  07/11/18 150 lb (68 kg)  04/26/18 149 lb 4 oz (67.7 kg)  02/27/18 149 lb (67.6 kg)     Lab Results  Component Value Date   WBC 5.5 10/03/2017   HGB 12.5 10/03/2017   HCT 39.0 10/03/2017   PLT 237.0 10/03/2017   GLUCOSE  101 (H) 07/09/2018   CHOL 166 07/09/2018   TRIG 131.0 07/09/2018   HDL 51.20 07/09/2018   LDLCALC 88 07/09/2018   ALT 15 07/09/2018   AST 13 07/09/2018   NA 137 07/09/2018   K 4.3 07/09/2018   CL 103 07/09/2018   CREATININE 0.94 07/09/2018   BUN 11 07/09/2018   CO2 27 07/09/2018   TSH 1.07 02/26/2018   HGBA1C 6.6 (H) 07/09/2018   MICROALBUR <0.7 07/09/2018       Assessment & Plan:   Problem List Items Addressed This Visit    Anemia    Follow cbc.  Relevant Orders   CBC with Differential/Platelet   Diabetes mellitus without complication ([place])    Low carb diet and exercise.  Follow met b and a1c.        Relevant Orders   Hemoglobin O7H   Basic metabolic panel   Essential hypertension, benign    Blood pressure under good control.  Continue same medication regimen.  Follow pressures.  Follow metabolic panel.        Health care maintenance    Physical today 07/11/18.  Mammogram 08/2017 - biopsy ok.  Schedule for f/u mammogram.  Colonoscopy 07/11/17 - hyperplastic and tubular adenomas.  Per pt, recommended f/u in 5 years.        Hypercholesterolemia    On lipitor.  Low cholesterol diet and exercise.  Follow lipid panel and liver function tests.        Relevant Orders   Hepatic function panel   Lipid panel   Thyroid disease    Followed by Dr Carmela Rima.  Has f/u planned for later this month.  F/u thyroid nodules.  Will send records.         Other Visit Diagnoses    Routine general medical examination at a health care facility    -  Primary   Breast cancer screening       Relevant Orders   MM 3D SCREEN BREAST BILATERAL       Einar Pheasant, MD

## 2018-07-11 NOTE — Assessment & Plan Note (Signed)
Physical today 07/11/18.  Mammogram 08/2017 - biopsy ok.  Schedule for f/u mammogram.  Colonoscopy 07/11/17 - hyperplastic and tubular adenomas.  Per pt, recommended f/u in 5 years.

## 2018-07-14 ENCOUNTER — Encounter: Payer: Self-pay | Admitting: Internal Medicine

## 2018-07-14 NOTE — Assessment & Plan Note (Signed)
Low carb diet and exercise.  Follow met b and a1c.   

## 2018-07-14 NOTE — Assessment & Plan Note (Signed)
Followed by Dr Carmela Rima.  Has f/u planned for later this month.  F/u thyroid nodules.  Will send records.

## 2018-07-14 NOTE — Assessment & Plan Note (Signed)
On lipitor.  Low cholesterol diet and exercise.  Follow lipid panel and liver function tests.   

## 2018-07-14 NOTE — Assessment & Plan Note (Signed)
Blood pressure under good control.  Continue same medication regimen.  Follow pressures.  Follow metabolic panel.   

## 2018-07-14 NOTE — Assessment & Plan Note (Addendum)
Follow cbc.  

## 2018-07-18 DIAGNOSIS — I10 Essential (primary) hypertension: Secondary | ICD-10-CM | POA: Diagnosis not present

## 2018-07-18 DIAGNOSIS — E039 Hypothyroidism, unspecified: Secondary | ICD-10-CM | POA: Diagnosis not present

## 2018-07-18 DIAGNOSIS — E785 Hyperlipidemia, unspecified: Secondary | ICD-10-CM | POA: Diagnosis not present

## 2018-07-18 DIAGNOSIS — E049 Nontoxic goiter, unspecified: Secondary | ICD-10-CM | POA: Diagnosis not present

## 2018-07-18 DIAGNOSIS — E041 Nontoxic single thyroid nodule: Secondary | ICD-10-CM | POA: Diagnosis not present

## 2018-07-23 ENCOUNTER — Ambulatory Visit: Payer: Medicare HMO

## 2018-08-08 DIAGNOSIS — E049 Nontoxic goiter, unspecified: Secondary | ICD-10-CM | POA: Diagnosis not present

## 2018-08-08 DIAGNOSIS — E039 Hypothyroidism, unspecified: Secondary | ICD-10-CM | POA: Diagnosis not present

## 2018-08-08 DIAGNOSIS — E041 Nontoxic single thyroid nodule: Secondary | ICD-10-CM | POA: Diagnosis not present

## 2018-08-08 DIAGNOSIS — I10 Essential (primary) hypertension: Secondary | ICD-10-CM | POA: Diagnosis not present

## 2018-08-08 DIAGNOSIS — E785 Hyperlipidemia, unspecified: Secondary | ICD-10-CM | POA: Diagnosis not present

## 2018-08-15 ENCOUNTER — Other Ambulatory Visit: Payer: Self-pay | Admitting: Internal Medicine

## 2018-09-07 ENCOUNTER — Ambulatory Visit
Admission: RE | Admit: 2018-09-07 | Discharge: 2018-09-07 | Disposition: A | Discharge status: Home / Self Care | Payer: Medicare HMO | Source: Ambulatory Visit | Attending: Internal Medicine | Admitting: Internal Medicine

## 2018-09-07 DIAGNOSIS — Z1231 Encounter for screening mammogram for malignant neoplasm of breast: Secondary | ICD-10-CM | POA: Diagnosis not present

## 2018-09-07 DIAGNOSIS — Z1239 Encounter for other screening for malignant neoplasm of breast: Secondary | ICD-10-CM

## 2018-10-21 ENCOUNTER — Emergency Department: Payer: Medicare HMO

## 2018-10-21 ENCOUNTER — Encounter: Payer: Self-pay | Admitting: Emergency Medicine

## 2018-10-21 ENCOUNTER — Other Ambulatory Visit: Payer: Self-pay

## 2018-10-21 ENCOUNTER — Emergency Department
Admission: EM | Admit: 2018-10-21 | Discharge: 2018-10-21 | Disposition: A | Discharge status: Home / Self Care | Payer: Medicare HMO | Attending: Emergency Medicine | Admitting: Emergency Medicine

## 2018-10-21 DIAGNOSIS — M7918 Myalgia, other site: Secondary | ICD-10-CM | POA: Diagnosis not present

## 2018-10-21 DIAGNOSIS — Z23 Encounter for immunization: Secondary | ICD-10-CM | POA: Insufficient documentation

## 2018-10-21 DIAGNOSIS — Z79899 Other long term (current) drug therapy: Secondary | ICD-10-CM | POA: Insufficient documentation

## 2018-10-21 DIAGNOSIS — S13100A Subluxation of unspecified cervical vertebrae, initial encounter: Secondary | ICD-10-CM | POA: Diagnosis not present

## 2018-10-21 DIAGNOSIS — S299XXA Unspecified injury of thorax, initial encounter: Secondary | ICD-10-CM | POA: Diagnosis not present

## 2018-10-21 DIAGNOSIS — R51 Headache: Secondary | ICD-10-CM | POA: Diagnosis not present

## 2018-10-21 DIAGNOSIS — I10 Essential (primary) hypertension: Secondary | ICD-10-CM | POA: Diagnosis not present

## 2018-10-21 DIAGNOSIS — S3992XA Unspecified injury of lower back, initial encounter: Secondary | ICD-10-CM | POA: Diagnosis not present

## 2018-10-21 DIAGNOSIS — Y9389 Activity, other specified: Secondary | ICD-10-CM | POA: Insufficient documentation

## 2018-10-21 DIAGNOSIS — S199XXA Unspecified injury of neck, initial encounter: Secondary | ICD-10-CM | POA: Diagnosis not present

## 2018-10-21 DIAGNOSIS — S3991XA Unspecified injury of abdomen, initial encounter: Secondary | ICD-10-CM | POA: Diagnosis not present

## 2018-10-21 DIAGNOSIS — M549 Dorsalgia, unspecified: Secondary | ICD-10-CM | POA: Diagnosis not present

## 2018-10-21 DIAGNOSIS — E119 Type 2 diabetes mellitus without complications: Secondary | ICD-10-CM | POA: Insufficient documentation

## 2018-10-21 DIAGNOSIS — Z87891 Personal history of nicotine dependence: Secondary | ICD-10-CM | POA: Insufficient documentation

## 2018-10-21 DIAGNOSIS — S0990XA Unspecified injury of head, initial encounter: Secondary | ICD-10-CM | POA: Diagnosis not present

## 2018-10-21 DIAGNOSIS — M542 Cervicalgia: Secondary | ICD-10-CM | POA: Diagnosis not present

## 2018-10-21 DIAGNOSIS — Y999 Unspecified external cause status: Secondary | ICD-10-CM | POA: Diagnosis not present

## 2018-10-21 DIAGNOSIS — Y92410 Unspecified street and highway as the place of occurrence of the external cause: Secondary | ICD-10-CM | POA: Diagnosis not present

## 2018-10-21 DIAGNOSIS — R52 Pain, unspecified: Secondary | ICD-10-CM

## 2018-10-21 DIAGNOSIS — M25511 Pain in right shoulder: Secondary | ICD-10-CM | POA: Diagnosis not present

## 2018-10-21 DIAGNOSIS — M25512 Pain in left shoulder: Secondary | ICD-10-CM | POA: Diagnosis not present

## 2018-10-21 LAB — URINALYSIS, COMPLETE (UACMP) WITH MICROSCOPIC
Bacteria, UA: NONE SEEN
Bilirubin Urine: NEGATIVE
GLUCOSE, UA: 50 mg/dL — AB
Hgb urine dipstick: NEGATIVE
Ketones, ur: NEGATIVE mg/dL
Leukocytes, UA: NEGATIVE
Nitrite: NEGATIVE
Protein, ur: NEGATIVE mg/dL
SPECIFIC GRAVITY, URINE: 1.016 (ref 1.005–1.030)
pH: 5 (ref 5.0–8.0)

## 2018-10-21 LAB — CBC WITH DIFFERENTIAL/PLATELET
Abs Immature Granulocytes: 0.03 10*3/uL (ref 0.00–0.07)
BASOS ABS: 0 10*3/uL (ref 0.0–0.1)
Basophils Relative: 0 %
Eosinophils Absolute: 0.1 10*3/uL (ref 0.0–0.5)
Eosinophils Relative: 1 %
HCT: 39.1 % (ref 36.0–46.0)
Hemoglobin: 12.3 g/dL (ref 12.0–15.0)
Immature Granulocytes: 0 %
Lymphocytes Relative: 19 %
Lymphs Abs: 1.8 10*3/uL (ref 0.7–4.0)
MCH: 27.8 pg (ref 26.0–34.0)
MCHC: 31.5 g/dL (ref 30.0–36.0)
MCV: 88.5 fL (ref 80.0–100.0)
Monocytes Absolute: 0.6 10*3/uL (ref 0.1–1.0)
Monocytes Relative: 6 %
NEUTROS PCT: 74 %
Neutro Abs: 7 10*3/uL (ref 1.7–7.7)
Platelets: 253 10*3/uL (ref 150–400)
RBC: 4.42 MIL/uL (ref 3.87–5.11)
RDW: 13.5 % (ref 11.5–15.5)
WBC: 9.5 10*3/uL (ref 4.0–10.5)
nRBC: 0 % (ref 0.0–0.2)

## 2018-10-21 LAB — BASIC METABOLIC PANEL
Anion gap: 10 (ref 5–15)
BUN: 15 mg/dL (ref 8–23)
CHLORIDE: 107 mmol/L (ref 98–111)
CO2: 23 mmol/L (ref 22–32)
Calcium: 9.5 mg/dL (ref 8.9–10.3)
Creatinine, Ser: 0.78 mg/dL (ref 0.44–1.00)
GFR calc Af Amer: >60 mL/min (ref >60)
GFR calc non Af Amer: >60 mL/min (ref >60)
Glucose, Bld: 117 mg/dL — ABNORMAL HIGH (ref 70–99)
Potassium: 3.4 mmol/L — ABNORMAL LOW (ref 3.5–5.1)
Sodium: 140 mmol/L (ref 135–145)

## 2018-10-21 MED ORDER — METAXALONE 800 MG PO TABS: ORAL_TABLET | ORAL | 0 refills | Status: DC

## 2018-10-21 MED ORDER — TETANUS-DIPHTH-ACELL PERTUSSIS 5-2.5-18.5 LF-MCG/0.5 IM SUSP
0.5000 mL | Freq: Once | INTRAMUSCULAR | Status: AC
  Administered 2018-10-21: 0.5 mL via INTRAMUSCULAR
  Filled 2018-10-21: qty 0.5

## 2018-10-21 MED ORDER — IOPAMIDOL (ISOVUE-300) INJECTION 61%
100.0000 mL | Freq: Once | INTRAVENOUS | Status: AC | PRN
  Administered 2018-10-21: 100 mL via INTRAVENOUS
  Filled 2018-10-21: qty 100
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
## 2018-10-21 NOTE — Discharge Instructions (Signed)
Please follow up with your primary care provider if not improving over the week.  Return to the ER for symptoms that change or worsen or for new concerns if unable to see primary care.

## 2018-11-19 ENCOUNTER — Other Ambulatory Visit (INDEPENDENT_AMBULATORY_CARE_PROVIDER_SITE_OTHER): Payer: Medicare HMO

## 2018-11-19 DIAGNOSIS — E7800 Pure hypercholesterolemia, unspecified: Secondary | ICD-10-CM | POA: Diagnosis not present

## 2018-11-19 DIAGNOSIS — D649 Anemia, unspecified: Secondary | ICD-10-CM | POA: Diagnosis not present

## 2018-11-19 DIAGNOSIS — E119 Type 2 diabetes mellitus without complications: Secondary | ICD-10-CM | POA: Diagnosis not present

## 2018-11-19 LAB — HEPATIC FUNCTION PANEL
ALT: 13 U/L (ref 0–35)
AST: 15 U/L (ref 0–37)
Albumin: 4.3 g/dL (ref 3.5–5.2)
Alkaline Phosphatase: 75 U/L (ref 39–117)
Bilirubin, Direct: 0.1 mg/dL (ref 0.0–0.3)
Total Bilirubin: 0.9 mg/dL (ref 0.2–1.2)
Total Protein: 8 g/dL (ref 6.0–8.3)

## 2018-11-19 LAB — CBC WITH DIFFERENTIAL/PLATELET
Basophils Absolute: 0 10*3/uL (ref 0.0–0.1)
Basophils Relative: 0.9 % (ref 0.0–3.0)
Eosinophils Absolute: 0.2 10*3/uL (ref 0.0–0.7)
Eosinophils Relative: 3.1 % (ref 0.0–5.0)
HCT: 39.7 % (ref 36.0–46.0)
Hemoglobin: 13.1 g/dL (ref 12.0–15.0)
Lymphocytes Relative: 48.2 % — ABNORMAL HIGH (ref 12.0–46.0)
Lymphs Abs: 2.4 10*3/uL (ref 0.7–4.0)
MCHC: 33.1 g/dL (ref 30.0–36.0)
MCV: 86.6 fl (ref 78.0–100.0)
MONO ABS: 0.4 10*3/uL (ref 0.1–1.0)
Monocytes Relative: 8.1 % (ref 3.0–12.0)
Neutro Abs: 2 10*3/uL (ref 1.4–7.7)
Neutrophils Relative %: 39.7 % — ABNORMAL LOW (ref 43.0–77.0)
Platelets: 246 10*3/uL (ref 150.0–400.0)
RBC: 4.58 Mil/uL (ref 3.87–5.11)
RDW: 14.2 % (ref 11.5–15.5)
WBC: 5.1 10*3/uL (ref 4.0–10.5)

## 2018-11-19 LAB — BASIC METABOLIC PANEL
BUN: 8 mg/dL (ref 6–23)
CO2: 28 mEq/L (ref 19–32)
Calcium: 9.9 mg/dL (ref 8.4–10.5)
Chloride: 100 mEq/L (ref 96–112)
Creatinine, Ser: 0.91 mg/dL (ref 0.40–1.20)
GFR: 72.82 mL/min (ref >60.00)
Glucose, Bld: 100 mg/dL — ABNORMAL HIGH (ref 70–99)
POTASSIUM: 3.9 meq/L (ref 3.5–5.1)
SODIUM: 136 meq/L (ref 135–145)

## 2018-11-19 LAB — LIPID PANEL
Cholesterol: 168 mg/dL (ref 0–200)
HDL: 48.7 mg/dL (ref >39.00)
LDL Cholesterol: 89 mg/dL (ref 0–99)
NonHDL: 118.91
Total CHOL/HDL Ratio: 3
Triglycerides: 150 mg/dL — ABNORMAL HIGH (ref 0.0–149.0)
VLDL: 30 mg/dL (ref 0.0–40.0)

## 2018-11-19 LAB — HEMOGLOBIN A1C: HEMOGLOBIN A1C: 6.6 % — AB (ref 4.6–6.5)

## 2018-11-21 ENCOUNTER — Ambulatory Visit (INDEPENDENT_AMBULATORY_CARE_PROVIDER_SITE_OTHER): Payer: Medicare HMO

## 2018-11-21 ENCOUNTER — Ambulatory Visit (INDEPENDENT_AMBULATORY_CARE_PROVIDER_SITE_OTHER): Payer: Medicare HMO | Admitting: Internal Medicine

## 2018-11-21 ENCOUNTER — Encounter: Payer: Self-pay | Admitting: Internal Medicine

## 2018-11-21 VITALS — BP 144/72 | HR 67 | Temp 97.6°F | Resp 16 | Wt 147.8 lb

## 2018-11-21 DIAGNOSIS — E7800 Pure hypercholesterolemia, unspecified: Secondary | ICD-10-CM | POA: Diagnosis not present

## 2018-11-21 DIAGNOSIS — M545 Low back pain, unspecified: Secondary | ICD-10-CM | POA: Insufficient documentation

## 2018-11-21 DIAGNOSIS — E119 Type 2 diabetes mellitus without complications: Secondary | ICD-10-CM | POA: Diagnosis not present

## 2018-11-21 DIAGNOSIS — D649 Anemia, unspecified: Secondary | ICD-10-CM | POA: Diagnosis not present

## 2018-11-21 DIAGNOSIS — I10 Essential (primary) hypertension: Secondary | ICD-10-CM | POA: Diagnosis not present

## 2018-11-21 DIAGNOSIS — M79645 Pain in left finger(s): Secondary | ICD-10-CM | POA: Diagnosis not present

## 2018-11-21 DIAGNOSIS — E079 Disorder of thyroid, unspecified: Secondary | ICD-10-CM | POA: Diagnosis not present

## 2018-11-21 DIAGNOSIS — S6992XA Unspecified injury of left wrist, hand and finger(s), initial encounter: Secondary | ICD-10-CM | POA: Diagnosis not present

## 2018-11-21 NOTE — Assessment & Plan Note (Signed)
On lipitor.  Low cholesterol diet and exercise.  Follow lipid panel and liver function tests.   Lab Results  Component Value Date   CHOL 168 11/19/2018   HDL 48.70 11/19/2018   LDLCALC 89 11/19/2018   TRIG 150.0 (H) 11/19/2018   CHOLHDL 3 11/19/2018

## 2018-11-21 NOTE — Assessment & Plan Note (Signed)
Was followed by Dr Carmela Rima.  On thyroid replacement.  Follow tsh.
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
## 2018-11-21 NOTE — Assessment & Plan Note (Signed)
Blood pressure rechecked by me;  132/82.  Continue current medication regimen.  Follow pressures.  Follow metabolic panel.

## 2018-11-21 NOTE — Assessment & Plan Note (Signed)
Low back pain.  No radicular symptoms.  Seeing a Restaurant manager, fast food.  Is improving.

## 2018-11-21 NOTE — Assessment & Plan Note (Signed)
Follow cbc.  

## 2018-11-21 NOTE — Assessment & Plan Note (Signed)
Low carb diet and exercise.  Recent a1c 6.6.  Follow met b and a1c.  
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
## 2018-11-22 ENCOUNTER — Other Ambulatory Visit: Payer: Self-pay | Admitting: Internal Medicine

## 2018-11-22 DIAGNOSIS — M79646 Pain in unspecified finger(s): Secondary | ICD-10-CM

## 2018-11-22 NOTE — Progress Notes (Signed)
Order placed for ortho referral.   

## 2018-11-23 ENCOUNTER — Telehealth: Payer: Self-pay | Admitting: *Deleted

## 2018-11-23 NOTE — Telephone Encounter (Signed)
Copied from [place] [id number]. Topic: General - Other >> Nov 23, 2018 12:02 PM Carolyn Stare wrote:  Pt said she no longer need the referral to see a orthapedic said her chiropractor addressed the issue

## 2018-12-29 ENCOUNTER — Other Ambulatory Visit: Payer: Self-pay | Admitting: Internal Medicine

## 2019-02-26 ENCOUNTER — Other Ambulatory Visit (HOSPITAL_COMMUNITY)
Admission: RE | Admit: 2019-02-26 | Discharge: 2019-02-26 | Disposition: A | Discharge status: Home / Self Care | Payer: Medicare HMO | Source: Ambulatory Visit | Attending: Family Medicine | Admitting: Family Medicine

## 2019-02-26 ENCOUNTER — Ambulatory Visit (INDEPENDENT_AMBULATORY_CARE_PROVIDER_SITE_OTHER): Payer: Medicare HMO | Admitting: Family Medicine

## 2019-02-26 ENCOUNTER — Encounter: Payer: Self-pay | Admitting: Family Medicine

## 2019-02-26 ENCOUNTER — Other Ambulatory Visit: Payer: Self-pay

## 2019-02-26 VITALS — BP 158/80 | HR 79 | Temp 98.6°F | Resp 18 | Ht 63.0 in | Wt 141.6 lb

## 2019-02-26 DIAGNOSIS — N8110 Cystocele, unspecified: Secondary | ICD-10-CM

## 2019-02-26 DIAGNOSIS — N898 Other specified noninflammatory disorders of vagina: Secondary | ICD-10-CM

## 2019-02-26 LAB — POCT URINALYSIS DIPSTICK
Bilirubin, UA: NEGATIVE
Blood, UA: 10
Glucose, UA: NEGATIVE
Ketones, UA: NEGATIVE
Leukocytes, UA: NEGATIVE
Nitrite, UA: NEGATIVE
Protein, UA: NEGATIVE
Spec Grav, UA: 1.015 (ref 1.010–1.025)
Urobilinogen, UA: 0.2 E.U./dL
pH, UA: 5.5 (ref 5.0–8.0)

## 2019-02-26 MED ORDER — FLUCONAZOLE 150 MG PO TABS: 150.0000 mg | ORAL_TABLET | Freq: Every day | ORAL | 0 refills | Status: AC

## 2019-02-26 NOTE — Progress Notes (Deleted)
Patient ID: Christina Cole, female   DOB: 06/17/1941, 78 y.o.   MRN: [id number]  Virtual Visit via *** Note  This visit type was conducted due to national recommendations for restrictions regarding the COVID-19 pandemic (e.g. social distancing).  This format is felt to be most appropriate for this patient at this time.  All issues noted in this document were discussed and addressed.  No physical exam was performed (except for noted visual exam findings with Video Visits).   I connected with@ on 02/26/19 at 11:20 AM EDT by a video enabled telemedicine application or telephone and verified that I am speaking with the correct person using two identifiers. Location patient: home Location provider: work or home office Persons participating in the virtual visit: patient, provider  I discussed the limitations, risks, security and privacy concerns of performing an evaluation and management service by telephone and the availability of in person appointments. I also discussed with the patient that there may be a patient responsible charge related to this service. The patient expressed understanding and agreed to proceed.  Interactive audio and video telecommunications were attempted between this provider and patient, however failed, due to patient having technical difficulties OR patient did not have access to video capability.  We continued and completed visit with audio only. ***  Reason for visit: ***  HPI: ***   ROS: See pertinent positives and negatives per HPI.  Past Medical History:  Diagnosis Date  . Heart murmur   . Hyperlipidemia   . Hypertension   . Thyroid disease    goiter (follows with Dr. Ronnald Collum)    Past Surgical History:  Procedure Laterality Date  . ABDOMINAL HYSTERECTOMY  1978   ovaries left in place  . APPENDECTOMY  1978  . BREAST BIOPSY Right 09/29/2017   Pt had Bx  done in [facility] @ 3:00 per chart-"FIBROCYSTIC CHANGES".  . COLONOSCOPY WITH PROPOFOL  N/A 07/11/2017   Procedure: COLONOSCOPY WITH PROPOFOL;  Surgeon: Lucilla Lame, MD;  Location[facility] ENDOSCOPY;  Service: Endoscopy;  Laterality: N/A;  . TUBAL LIGATION      Family History  Problem Relation Age of Onset  . Stroke Mother   . Hypertension Mother   . Arthritis Mother   . Breast cancer Sister 28  . Breast cancer Sister 37  . Lung cancer Brother   . Diabetes Brother     SOCIAL HX: ***   Current Outpatient Medications:  .  atorvastatin (LIPITOR) 40 MG tablet, TAKE 1 TABLET EVERY DAY, Disp: 90 tablet, Rfl: 3 .  diltiazem (TIAZAC) 360 MG 24 hr capsule, TAKE 1 CAPSULE EVERY DAY, Disp: 90 capsule, Rfl: 3 .  EPINEPHrine (EPIPEN 2-PAK) 0.3 mg/0.3 mL IJ SOAJ injection, USE AS DIRECTED, Disp: 2 Device, Rfl: 0 .  levothyroxine (SYNTHROID, LEVOTHROID) 25 MCG tablet, Take 25 mcg by mouth daily before breakfast., Disp: , Rfl:  .  losartan (COZAAR) 100 MG tablet, TAKE 1 TABLET EVERY DAY, Disp: 90 tablet, Rfl: 3 .  metaxalone (SKELAXIN) 800 MG tablet, 1/2-1 whole tablet if needed every 8 hours for muscle pain., Disp: 30 tablet, Rfl: 0 .  metroNIDAZOLE (METROGEL) 0.75 % vaginal gel, PLACE 1 APPLICATORFUL VAGINALLY 2 (TWO) TIMES DAILY FOR 5 DAYS, Disp: 70 g, Rfl: 0 .  nystatin cream (MYCOSTATIN), Apply 1 application topically 2 (two) times daily. Apply to affected area (on vagina) bid, Disp: 30 g, Rfl: 0 .  Polyvinyl Alcohol-Povidone (REFRESH OP), Apply to eye., Disp: , Rfl:   EXAM:  VITALS per  patient if applicable:  GENERAL: alert, oriented, appears well and in no acute distress  HEENT: atraumatic, conjunttiva clear, no obvious abnormalities on inspection of external nose and ears  NECK: normal movements of the head and neck  LUNGS: on inspection no signs of respiratory distress, breathing rate appears normal, no obvious gross SOB, gasping or wheezing  CV: no obvious cyanosis  MS: moves all visible extremities without noticeable abnormality  PSYCH/NEURO: pleasant and  cooperative, no obvious depression or anxiety, speech and thought processing grossly intact  ASSESSMENT AND PLAN:  Discussed the following assessment and plan:  No diagnosis found.  No problem-specific Assessment & Plan notes found for this encounter.    I discussed the assessment and treatment plan with the patient. The patient was provided an opportunity to ask questions and all were answered. The patient agreed with the plan and demonstrated an understanding of the instructions.   The patient was advised to call back or seek an in-person evaluation if the symptoms worsen or if the condition fails to improve as anticipated.  I provided *** minutes of non-face-to-face time during this encounter.   Jodelle Green, FNP

## 2019-02-26 NOTE — Patient Instructions (Signed)
Use dove soap and warm water when washing vaginal area  Do not use antibacterial soap or douching kit -- the vagina is self cleaning and these things can irritate it more   Pelvic Organ Prolapse Pelvic organ prolapse is the stretching, bulging, or dropping of pelvic organs into an abnormal position. It happens when the muscles and tissues that surround and support pelvic structures become weak or stretched. Pelvic organ prolapse can involve the:  Vagina (vaginal prolapse).  Uterus (uterine prolapse).  Bladder (cystocele).  Rectum (rectocele).  Intestines (enterocele). When organs other than the vagina are involved, they often bulge into the vagina or protrude from the vagina, depending on how severe the prolapse is. What are the causes? This condition may be caused by:  Pregnancy, labor, and childbirth.  Past pelvic surgery.  Decreased production of the hormone estrogen associated with menopause.  Consistently lifting more than 50 lb (23 kg).  Obesity.  Long-term inability to pass stool (chronic constipation).  A cough that lasts a long time (chronic).  Buildup of fluid in the abdomen due to certain diseases and other conditions. What are the signs or symptoms? Symptoms of this condition include:  Passing a little urine (loss of bladder control) when you cough, sneeze, strain, and exercise (stress incontinence). This may be worse immediately after childbirth. It may gradually improve over time.  Feeling pressure in your pelvis or vagina. This pressure may increase when you cough or when you are passing stool.  A bulge that protrudes from the opening of your vagina.  Difficulty passing urine or stool.  Pain in your lower back.  Pain, discomfort, or disinterest in sex.  Repeated bladder infections (urinary tract infections).  Difficulty inserting a tampon. In some people, this condition causes no symptoms. How is this diagnosed? This condition may be diagnosed  based on a vaginal and rectal exam. During the exam, you may be asked to cough and strain while you are lying down, sitting, and standing up. Your health care provider will determine if other tests are required, such as bladder function tests. How is this treated? Treatment for this condition may depend on your symptoms. Treatment may include:  Lifestyle changes, such as changes to your diet.  Emptying your bladder at scheduled times (bladder training therapy). This can help reduce or avoid urinary incontinence.  Estrogen. Estrogen may help mild prolapse by increasing the strength and tone of pelvic floor muscles.  Kegel exercises. These may help mild cases of prolapse by strengthening and tightening the muscles of the pelvic floor.  A soft, flexible device that helps support the vaginal walls and keep pelvic organs in place (pessary). This is inserted into your vagina by your health care provider.  Surgery. This is often the only form of treatment for severe prolapse. Follow these instructions at home:  Avoid drinking beverages that contain caffeine or alcohol.  Increase your intake of high-fiber foods. This can help decrease constipation and straining during bowel movements.  Lose weight if recommended by your health care provider.  Wear a sanitary pad or adult diapers if you have urinary incontinence.  Avoid heavy lifting and straining with exercise and work. Do not hold your breath when you perform mild to moderate lifting and exercise activities. Limit your activities as directed by your health care provider.  Do Kegel exercises as directed by your health care provider. To do this: ? Squeeze your pelvic floor muscles tight. You should feel a tight lift in your rectal area and a  tightness in your vaginal area. Keep your stomach, buttocks, and legs relaxed. ? Hold the muscles tight for up to 10 seconds. ? Relax your muscles. ? Repeat this exercise 50 times a day, or as many times as  told by your health care provider. Continue to do this exercise for at least 4-6 weeks, or for as long as told by your health care provider.  Take over-the-counter and prescription medicines only as told by your health care provider.  If you have a pessary, take care of it as told by your health care provider.  Keep all follow-up visits as told by your health care provider. This is important. Contact a health care provider if you:  Have symptoms that interfere with your daily activities or sex life.  Need medicine to help with the discomfort.  Notice bleeding from your vagina that is not related to your period.  Have a fever.  Have pain or bleeding when you urinate.  Have bleeding when you pass stool.  Pass urine when you have sex.  Have chronic constipation.  Have a pessary that falls out.  Have bad smelling vaginal discharge.  Have an unusual, low pain in your abdomen. Summary  Pelvic organ prolapse is the stretching, bulging, or dropping of pelvic organs into an abnormal position. It happens when the muscles and tissues that surround and support pelvic structures become weak or stretched.  When organs other than the vagina are involved, they often bulge into the vagina or protrude from the vagina, depending on how severe the prolapse is.  In most cases, this condition needs to be treated only if it produces symptoms. Treatment may include lifestyle changes, estrogen, Kegel exercises, pessary insertion, or surgery.  Avoid heavy lifting and straining with exercise and work. Do not hold your breath when you perform mild to moderate lifting and exercise activities. Limit your activities as directed by your health care provider. This information is not intended to replace advice given to you by your health care provider. Make sure you discuss any questions you have with your health care provider. Document Released: 05/14/2014 Document Revised: 11/08/2017 Document Reviewed:  11/08/2017 Elsevier Interactive Patient Education  2019 [employer].

## 2019-02-27 NOTE — Progress Notes (Signed)
Subjective:    Patient ID: Christina Cole, female    DOB: 1941-04-12, 78 y.o.   MRN: [id number]  HPI   Patient and I originally connected via telephone to discuss vaginal issues including a mass in vaginal area, irritation with questionable bumps and lumps, possible blisters.  Due to patient's symptoms I advised her that would be much better if she came to the office  So we can do a pelvic exam as it would be hard for me to determine exactly what is going on.  Patient presented to clinic so we can do vaginal exam.  States that she had noticed some discharge down in her vaginal area about a week ago, and bought some strong antibacterial soap to wash herself in peri area and also states she washed up inside her vagina with antibacterial soap.  States since doing that she feels her skin has become extremely irritated.  She had some leftover MetroGel from a previous bacterial vaginosis infection and has been using that for 2 nights without much relief.  Denies any known history of STDs.  Denies any current concerns for STDs.  Denies any herpes outbreaks last.  Denies any known exposure to herpes.  Patient Active Problem List   Diagnosis Date Noted  . Finger pain, left 11/21/2018  . Low back pain 11/21/2018  . Diabetes mellitus without complication ([place]) 50/93/2671  . Benign breast cyst in female, left 09/29/2017  . Mass of upper inner quadrant of right breast 09/29/2017  . Abnormal mammogram 09/26/2017  . Family history of polyps in the colon   . Benign neoplasm of transverse colon   . Perforation of intestine ([place])   . Polyp of sigmoid colon   . Health care maintenance 01/11/2015  . Osteoarthritis of right knee 10/08/2014  . Hypokalemia 06/12/2013  . Anemia 06/12/2013  . Essential hypertension, benign 12/30/2012  . Hypercholesterolemia 12/30/2012  . Thyroid disease 12/30/2012  . Environmental allergies 12/30/2012   Social History   Tobacco Use  . Smoking status: Former Research scientist (life sciences)  .  Smokeless tobacco: Never Used  Substance Use Topics  . Alcohol use: No    Alcohol/week: 0.0 standard drinks   Review of Systems  Constitutional: Negative for chills, fatigue and fever.  HENT: Negative for congestion, ear pain, sinus pain and sore throat.   Eyes: Negative.   Respiratory: Negative for cough, shortness of breath and wheezing.   Cardiovascular: Negative for chest pain, palpitations and leg swelling.  Gastrointestinal: Negative for abdominal pain, diarrhea, nausea and vomiting.  Genitourinary: Negative for dysuria, frequency and urgency. +vaginal discharge/irritation, maybe yeast? Lump in vaginal area.  Musculoskeletal: Negative for arthralgias and myalgias.  Skin: Negative for color change, pallor and rash.  Neurological: Negative for syncope, light-headedness and headaches.  Psychiatric/Behavioral: The patient is not nervous/anxious.       Objective:   Physical Exam Vitals signs and nursing note reviewed.  Constitutional:      General: She is not in acute distress.    Appearance: She is not toxic-appearing.  HENT:     Head: Normocephalic and atraumatic.  Eyes:     General: No scleral icterus.    Extraocular Movements: Extraocular movements intact.     Pupils: Pupils are equal, round, and reactive to light.  Cardiovascular:     Rate and Rhythm: Normal rate and regular rhythm.     Heart sounds: Normal heart sounds.  Pulmonary:     Effort: Pulmonary effort is normal. No respiratory distress.  Breath sounds: Normal breath sounds.  Abdominal:     General: Bowel sounds are normal. There is no distension.     Palpations: Abdomen is soft.     Tenderness: There is no abdominal tenderness. There is no right CVA tenderness, left CVA tenderness, guarding or rebound.     Hernia: There is no hernia in the right inguinal area or left inguinal area.  Genitourinary:    Pubic Area: No rash.      Vagina: Vaginal discharge (white discharge present) and erythema (skin appears  red and irritated) present. No lesions.     Comments: Labia skin does appear red and irritated. Some white discharge present on labial folds. Patient also has pelvic organ prolapse causing cystocele, which would explain her feeling a "mass" in vaginal area.  Lymphadenopathy:     Lower Body: No right inguinal adenopathy. No left inguinal adenopathy.  Skin:    General: Skin is warm and dry.     Coloration: Skin is not pale.     Findings: No erythema.  Neurological:     Mental Status: She is alert and oriented to person, place, and time.     Gait: Gait normal.  Psychiatric:        Mood and Affect: Mood normal.        Behavior: Behavior normal.    Vitals:   02/26/19 1333  BP: (!) 158/80  Pulse: 79  Resp: 18  Temp: 98.6 F (37 C)  SpO2: 97%      Assessment & Plan:    A total of 25  minutes were spent face-to-face with the patient during this encounter and over half of that time was spent on counseling and coordination of care. The patient was counseled on vaginal cleaning/care.   Vaginal discharge/vaginal irritation-I suspect patient has a yeast infection as indicated by her discharge.  Swabs collected to test for STDs, bacterial vaginosis, yeast and to rule out HSV.  Patient will take course of Diflucan to treat yeast.  We will wait on testing results to see if treatment of bacterial vaginosis is needed.  Patient advised to not use any sort of harsh antibacterial soap when bathing vaginal area.  Advised vaginal area is very sensitive and a very mild soap such as Dove or Cetaphil will work well to clean.  Also advised to only wash outside vaginal area with mild soap and rinse with lukewarm water, advised to not put any soap up into vagina to wash.  Advised that vagina is a self cleaning organ and has its own protective bacterial flora that helps to keep the vagina in good health.  Washing aggressively with harsh soaps, douching, putting self up and do vaginal opening will only throw off  the pH and cause irritation.  Vaginal prolapse- patient does have some mild pelvic organ prolapse.  Advised that there are different ways to treat this such as surgery or pessary or you can just monitor area while doing [place] exercises to attempt and strengthen the muscles.  Patient given informational handout outlining what pelvic organ prolapse is and how it can be helped.  Patient will be contacted in regards to all of her testing results.  We will treat accordingly if needed based on testing results.  Patient advised to keep her regularly planned follow-up as scheduled and return to clinic sooner if any issues arise.

## 2019-02-28 LAB — CERVICOVAGINAL ANCILLARY ONLY
Bacterial vaginitis: NEGATIVE
Candida vaginitis: POSITIVE — AB
Chlamydia: NEGATIVE
Neisseria Gonorrhea: NEGATIVE
Trichomonas: NEGATIVE

## 2019-02-28 LAB — TIQ-NTM

## 2019-03-01 ENCOUNTER — Telehealth: Payer: Self-pay | Admitting: Radiology

## 2019-03-01 ENCOUNTER — Other Ambulatory Visit: Payer: Self-pay | Admitting: Radiology

## 2019-03-01 NOTE — Telephone Encounter (Signed)
I called [facility] and I was told that was the wrong container for the test that was ordered. I was told the only test that they can run with that container sent in is Herpes Simplex Virus Complex. If you want I can call them back to have them run the Herpes Simplex Virus Complex

## 2019-03-01 NOTE — Telephone Encounter (Signed)
Can we see what correct test code is and I will fix the order  Thanks!  LG

## 2019-03-01 NOTE — Telephone Encounter (Signed)
Called [facility] back and they will be running The Herpes Simplex Virus Complex test today.

## 2019-03-01 NOTE — Telephone Encounter (Signed)
Called [facility] where HSV swab was sent to. Representative stated that the [place] form was due to the incorrect test being ordered. Representative stated provider would need to order correct test code for the correct type of swab.

## 2019-03-01 NOTE — Telephone Encounter (Signed)
Yes run herpes simplex virus complex

## 2019-03-06 DIAGNOSIS — E049 Nontoxic goiter, unspecified: Secondary | ICD-10-CM | POA: Diagnosis not present

## 2019-03-06 DIAGNOSIS — E785 Hyperlipidemia, unspecified: Secondary | ICD-10-CM | POA: Diagnosis not present

## 2019-03-06 DIAGNOSIS — E039 Hypothyroidism, unspecified: Secondary | ICD-10-CM | POA: Diagnosis not present

## 2019-03-06 DIAGNOSIS — I10 Essential (primary) hypertension: Secondary | ICD-10-CM | POA: Diagnosis not present

## 2019-03-06 DIAGNOSIS — E041 Nontoxic single thyroid nodule: Secondary | ICD-10-CM | POA: Diagnosis not present

## 2019-03-06 LAB — HERPES SIMPLEX VIRUS CULTURE W/RFLX TO TYPING
MICRO NUMBER:: 438653
SPECIMEN QUALITY:: ADEQUATE

## 2019-03-06 LAB — SURESWAB HSV, TYPE 1/2 DNA, PCR

## 2019-03-06 LAB — TEST AUTHORIZATION

## 2019-03-08 LAB — LIPID PANEL
Cholesterol: 147 (ref 0–200)
HDL: 54 (ref 35–70)
LDL Cholesterol: 72
Triglycerides: 107 (ref 40–160)

## 2019-03-08 LAB — HEPATIC FUNCTION PANEL
ALT: 10 (ref 7–35)
AST: 14 (ref 13–35)
Alkaline Phosphatase: 81 (ref 25–125)
Bilirubin, Total: 0.7

## 2019-03-08 LAB — BASIC METABOLIC PANEL
BUN: 10 (ref 4–21)
Creatinine: 0.9 (ref 0.5–1.1)
Glucose: 93
Potassium: 3.9 (ref 3.4–5.3)
Sodium: 138 (ref 137–147)

## 2019-03-08 LAB — TSH: TSH: 0.95 (ref 0.41–5.90)

## 2019-03-13 DIAGNOSIS — E785 Hyperlipidemia, unspecified: Secondary | ICD-10-CM | POA: Diagnosis not present

## 2019-03-13 DIAGNOSIS — E041 Nontoxic single thyroid nodule: Secondary | ICD-10-CM | POA: Diagnosis not present

## 2019-03-13 DIAGNOSIS — I10 Essential (primary) hypertension: Secondary | ICD-10-CM | POA: Diagnosis not present

## 2019-03-13 DIAGNOSIS — E049 Nontoxic goiter, unspecified: Secondary | ICD-10-CM | POA: Diagnosis not present

## 2019-03-13 DIAGNOSIS — E039 Hypothyroidism, unspecified: Secondary | ICD-10-CM | POA: Diagnosis not present

## 2019-03-19 ENCOUNTER — Ambulatory Visit (INDEPENDENT_AMBULATORY_CARE_PROVIDER_SITE_OTHER): Payer: Medicare HMO | Admitting: Family Medicine

## 2019-03-19 ENCOUNTER — Ambulatory Visit: Payer: Self-pay | Admitting: Internal Medicine

## 2019-03-19 ENCOUNTER — Other Ambulatory Visit: Payer: Self-pay

## 2019-03-19 DIAGNOSIS — R220 Localized swelling, mass and lump, head: Secondary | ICD-10-CM

## 2019-03-19 DIAGNOSIS — Z91030 Bee allergy status: Secondary | ICD-10-CM

## 2019-03-19 MED ORDER — METHYLPREDNISOLONE 4 MG PO TBPK: ORAL_TABLET | ORAL | 0 refills | Status: DC

## 2019-03-19 MED ORDER — EPINEPHRINE 0.3 MG/0.3ML IJ SOAJ: INTRAMUSCULAR | 0 refills | Status: DC

## 2019-03-19 NOTE — Telephone Encounter (Signed)
Seeing you at 2:20

## 2019-03-19 NOTE — Telephone Encounter (Signed)
Pt called in c/o lower lip swelling that started an hour ago.  See triage notes.  She requested a virtual visit (COVID-19 pandemic).   I warm transferred her call to the office for scheduling.  I sent these notes to the office.  Reason for Disposition . Mild lip swelling of unknown cause    Pt requested a virtual visit  Answer Assessment - Initial Assessment Questions 1. ONSET: "When did the swelling start?" (e.g., minutes, hours, days)     Started about an hour ago.  I took my thyroid medicine and now my lower lip is swollen.   2. LOCATION: "What part of the face is swollen?"     Lower lip.   My epi pen has expired.   3. SEVERITY: "How swollen is it?"     No chest pain or shortness of breath.  I have not eaten anything.   I have a place on my thigh that itches like something bit me. 4. ITCHING: "Is there any itching?" If so, ask: "How much?"   (Scale 1-10; mild, moderate or severe)     No itching on lip.   I felt like I was biting the inside of my lip. 5. PAIN: "Is the swelling painful to touch?" If so, ask: "How painful is it?"   (Scale 1-10; mild, moderate or severe)     No 6. FEVER: "Do you have a fever?" If so, ask: "What is it, how was it measured, and when did it start?"      No   7. CAUSE: "What do you think is causing the face swelling?"     No 8. RECURRENT SYMPTOM: "Have you had face swelling before?" If so, ask: "When was the last time?" "What happened that time?"     No 9. OTHER SYMPTOMS: "Do you have any other symptoms?" (e.g., toothache, leg swelling)     No 10. PREGNANCY: "Is there any chance you are pregnant?" "When was your last menstrual period?"       N/A  Answer Assessment - Initial Assessment Questions 1. ONSET: "When did the swelling start?" (e.g., minutes, hours, days)     Started about an hour ago. 2. SEVERITY: "How swollen is it?"     It's the lower lip.  I felt myself biting the inside of it.   3. ITCHING: "Is there any itching?" If so, ask: "How  much?"   (Scale 1-10; mild, moderate or severe)     No 4. PAIN: "Is the swelling painful to touch?" If so, ask: "How painful is it?"   (Scale 1-10; mild, moderate or severe)     No 5. CAUSE: "What do you think is causing the lip swelling?"     *No Answer*I don't know. 6. RECURRENT SYMPTOM: "Have you had lip swelling before?" If so, ask: "When was the last time?" "What happened that time?"     No 7. OTHER SYMPTOMS: "Do you have any other symptoms?" (e.g., toothache)     None 8. PREGNANCY: "Is there any chance you are pregnant?" "When was your last menstrual period?"     Not asked due to age  Protocols used: [facility], [facility]

## 2019-03-19 NOTE — Progress Notes (Signed)
Patient ID: Christina Cole, female   DOB: [birth year], 78 y.o.   MRN: [id number]    Virtual Visit via video Note  This visit type was conducted due to national recommendations for restrictions regarding the COVID-19 pandemic (e.g. social distancing).  This format is felt to be most appropriate for this patient at this time.  All issues noted in this document were discussed and addressed.  No physical exam was performed (except for noted visual exam findings with Video Visits).   I connected with Christina Cole today at  2:20 PM EDT by a video enabled telemedicine application and verified that I am speaking with the correct person using two identifiers. Location patient: home Location provider: [facility] Persons participating in the virtual visit: patient, provider  I discussed the limitations, risks, security and privacy concerns of performing an evaluation and management service by video and the availability of in person appointments. I also discussed with the patient that there may be a patient responsible charge related to this service. The patient expressed understanding and agreed to proceed.   HPI:  Patient and I connected via video due to complaint of swollen bottom lip.  Patient states she noticed her bottom lip being a little swollen after she took her thyroid medication couple of hours ago.  States her lip feels like as if she accidentally bit the inside of lip and became swollen.  Denies any scratchiness or swelling of tongue, denies scratchy feeling in back of throat denies any difficulty breathing, denies any drooling.  Patient has taken thyroid medication for years and has never had allergic reaction to it before.  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  As far as patient knows she does not have allergies to foods.  Denies cough, chest pain, fever or chills.  Denies GI or GU issues.  Denies body aches.  ROS: See pertinent positives and  negatives per HPI.  Past Medical History:  Diagnosis Date  . Heart murmur   . Hyperlipidemia   . Hypertension   . Thyroid disease    goiter (follows with Dr. Ronnald Collum)    Past Surgical History:  Procedure Laterality Date  . ABDOMINAL HYSTERECTOMY  1978   ovaries left in place  . APPENDECTOMY  1978  . BREAST BIOPSY Right 09/29/2017   Pt had Bx  done in [facility]'s office [place] @ 3:00 per chart-"FIBROCYSTIC CHANGES".  . COLONOSCOPY WITH PROPOFOL N/A 07/11/2017   Procedure: COLONOSCOPY WITH PROPOFOL;  Surgeon: Lucilla Lame, MD;  Location: [facility] ENDOSCOPY;  Service: Endoscopy;  Laterality: N/A;  . TUBAL LIGATION      Family History  Problem Relation Age of Onset  . Stroke Mother   . Hypertension Mother   . Arthritis Mother   . Breast cancer Sister 55  . Breast cancer Sister 48  . Lung cancer Brother   . Diabetes Brother    Social History   Tobacco Use  . Smoking status: Former Research scientist (life sciences)  . Smokeless tobacco: Never Used  Substance Use Topics  . Alcohol use: No    Alcohol/week: 0.0 standard drinks    Current Outpatient Medications:  .  atorvastatin (LIPITOR) 40 MG tablet, TAKE 1 TABLET EVERY DAY, Disp: 90 tablet, Rfl: 3 .  diltiazem (TIAZAC) 360 MG 24 hr capsule, TAKE 1 CAPSULE EVERY DAY, Disp: 90 capsule, Rfl: 3 .  EPINEPHrine (EPIPEN 2-PAK) 0.3 mg/0.3 mL IJ SOAJ injection, USE AS DIRECTED, Disp: 2 Device, Rfl: 0 .  levothyroxine (SYNTHROID, LEVOTHROID) 25 MCG tablet, Take 25 mcg by mouth daily before breakfast., Disp: , Rfl:  .  losartan (COZAAR) 100 MG tablet, TAKE 1 TABLET EVERY DAY, Disp: 90 tablet, Rfl: 3 .  metaxalone (SKELAXIN) 800 MG tablet, 1/2-1 whole tablet if needed every 8 hours for muscle pain., Disp: 30 tablet, Rfl: 0 .  metroNIDAZOLE (METROGEL) 0.75 % vaginal gel, PLACE 1 APPLICATORFUL VAGINALLY 2 (TWO) TIMES DAILY FOR 5 DAYS, Disp: 70 g, Rfl: 0 .  nystatin cream (MYCOSTATIN), Apply 1 application topically 2 (two) times daily. Apply to affected area (on vagina)  bid, Disp: 30 g, Rfl: 0 .  Polyvinyl Alcohol-Povidone (REFRESH OP), Apply to eye., Disp: , Rfl:   EXAM:  GENERAL: alert, oriented, appears well and in no acute distress  HEENT: atraumatic, conjunttiva clear, no obvious abnormalities on inspection of external nose and ears. Bottom lip does appear somewhat swollen when compared to upper lip.   NECK: normal movements of the head and neck  LUNGS: on inspection no signs of respiratory distress, breathing rate appears normal, no obvious gross SOB, gasping or wheezing  CV: no obvious cyanosis  MS: moves all visible extremities without noticeable abnormality  PSYCH/NEURO: pleasant and cooperative, no obvious depression or anxiety, speech and thought processing grossly intact  ASSESSMENT AND PLAN:  Discussed the following assessment and plan:  Lip swelling - Plan: methylPREDNISolone (MEDROL DOSEPAK) 4 MG TBPK tablet  History of bee sting allergy - Plan: EPINEPHrine (EPIPEN 2-PAK) 0.3 mg/0.3 mL IJ SOAJ injection  Bottom lip does appear swollen over video feed.  Otherwise patient appears to be in no distress.  Suspect the lip swelling could be from a mild allergic reaction to something it is come in contact with.  We will treat with oral steroid taper.  Patient also advised to take a Claritin daily for at least the next 1 to 2 weeks to calm down bodies histamine response.  Advised that if her lips began to swell more, she develops any drooling, tingling/scratchy sensation in tongue or throat, any difficulty breathing to go to ER right away for evaluation.  EpiPen refill given, patient uses this for bee sting allergy.  Current EpiPen supply is expired.   I discussed the assessment and treatment plan with the patient. The patient was provided an opportunity to ask questions and all were answered. The patient agreed with the plan and demonstrated an understanding of the instructions.   The patient was advised to call back or seek an in-person  evaluation if the symptoms worsen or if the condition fails to improve as anticipated.   Christina Green, FNP

## 2019-03-20 ENCOUNTER — Encounter: Payer: Self-pay | Admitting: Family Medicine

## 2019-03-21 ENCOUNTER — Other Ambulatory Visit (INDEPENDENT_AMBULATORY_CARE_PROVIDER_SITE_OTHER): Payer: Medicare HMO

## 2019-03-21 ENCOUNTER — Telehealth: Payer: Self-pay | Admitting: *Deleted

## 2019-03-21 ENCOUNTER — Encounter: Payer: Self-pay | Admitting: Internal Medicine

## 2019-03-21 ENCOUNTER — Other Ambulatory Visit: Payer: Self-pay

## 2019-03-21 DIAGNOSIS — I10 Essential (primary) hypertension: Secondary | ICD-10-CM | POA: Diagnosis not present

## 2019-03-21 DIAGNOSIS — E7800 Pure hypercholesterolemia, unspecified: Secondary | ICD-10-CM

## 2019-03-21 DIAGNOSIS — E119 Type 2 diabetes mellitus without complications: Secondary | ICD-10-CM | POA: Diagnosis not present

## 2019-03-21 DIAGNOSIS — E079 Disorder of thyroid, unspecified: Secondary | ICD-10-CM

## 2019-03-21 LAB — HEMOGLOBIN A1C: Hgb A1c MFr Bld: 6.7 % — ABNORMAL HIGH (ref 4.6–6.5)

## 2019-03-21 NOTE — Telephone Encounter (Signed)
Please call pt and let her know that we did not receive labs.  Need to know where she had them drawn so we can get a copy.  I can hold off ordering labs here until we receive.  May need to be redrawn if they did not do all we needed.  Let me know if a problem.

## 2019-03-21 NOTE — Telephone Encounter (Signed)
Called and spoke to pt.  Pt said that she got her labs drawn at Dr. Tana Conch office who she sees for her thyroid issues.  Pt said that she mailed in a copy to this office last week.  Pt said that she will bring in a copy lab results once she gets them.

## 2019-03-21 NOTE — Telephone Encounter (Signed)
Pt came in for labs this morning & mentioned that she had a lot of these labs done this month  & mailed [place] a copy. Please verify & let me know if I need to cancel any orders before sending off at [id number]. She is certain that a cholesterol amongst other labs were drawn.

## 2019-03-21 NOTE — Addendum Note (Signed)
Addended by: Leeanne Rio on: 03/21/2019 11:45 AM   Modules accepted: Orders

## 2019-03-21 NOTE — Telephone Encounter (Signed)
Notify Ms [facility] that Christina Cole called them and they faxed over today a copy of the labs.  They had everything we needed except the a1c.  We will notify her of the a1c results once they are available.

## 2019-03-21 NOTE — Telephone Encounter (Signed)
I called Dr. Ronnald Collum office & they are going to fax the results over to our main fax number in my attention.

## 2019-03-21 NOTE — Telephone Encounter (Signed)
Patient notified

## 2019-03-26 ENCOUNTER — Encounter: Payer: Self-pay | Admitting: Internal Medicine

## 2019-03-26 ENCOUNTER — Other Ambulatory Visit: Payer: Self-pay

## 2019-03-26 ENCOUNTER — Ambulatory Visit (INDEPENDENT_AMBULATORY_CARE_PROVIDER_SITE_OTHER): Payer: Medicare HMO | Admitting: Internal Medicine

## 2019-03-26 DIAGNOSIS — E7800 Pure hypercholesterolemia, unspecified: Secondary | ICD-10-CM

## 2019-03-26 DIAGNOSIS — R220 Localized swelling, mass and lump, head: Secondary | ICD-10-CM | POA: Diagnosis not present

## 2019-03-26 DIAGNOSIS — I10 Essential (primary) hypertension: Secondary | ICD-10-CM

## 2019-03-26 DIAGNOSIS — E079 Disorder of thyroid, unspecified: Secondary | ICD-10-CM | POA: Diagnosis not present

## 2019-03-26 DIAGNOSIS — E119 Type 2 diabetes mellitus without complications: Secondary | ICD-10-CM

## 2019-03-26 DIAGNOSIS — D649 Anemia, unspecified: Secondary | ICD-10-CM

## 2019-03-26 DIAGNOSIS — Z9109 Other allergy status, other than to drugs and biological substances: Secondary | ICD-10-CM | POA: Diagnosis not present

## 2019-03-26 NOTE — Progress Notes (Signed)
Patient ID: Christina Cole, female   DOB: 06/12/1941, 78 y.o.   MRN: [id number]   Virtual Visit via video Note  This visit type was conducted due to national recommendations for restrictions regarding the COVID-19 pandemic (e.g. social distancing).  This format is felt to be most appropriate for this patient at this time.  All issues noted in this document were discussed and addressed.  No physical exam was performed (except for noted visual exam findings with Video Visits).   I connected with Christina Cole by a video enabled telemedicine application or telephone and verified that I am speaking with the correct person using two identifiers. Location patient: home Location provider: work  Persons participating in the virtual visit: patient, provider  I discussed the limitations, risks, security and privacy concerns of performing an evaluation and management service by video and the availability of in person appointments. The patient expressed understanding and agreed to proceed.   Reason for visit: scheduled follow up.   HPI: She reports she is doing relatively well.  Was evaluated 03/19/19 for lip swelling.  Was given an epi pen and prednisone.  Unclear etiology.  No tongue swelling or sob.  No lip swelling now.  Completed prednisone a couple of days ago.  Trying to stay in due to COVID restrictions.  No fever.  No cough, congestion or osb.  No acid reflux.  No abdominal pain.  Bowels moving.  Discussed labs.  a1c 6.7.  On lipitor to control her cholesterol.  Seeing Dr Carmela Rima for f/u of her thyroid.  Discussed allergy testing.    ROS: See pertinent positives and negatives per HPI.  Past Medical History:  Diagnosis Date  . Heart murmur   . Hyperlipidemia   . Hypertension   . Thyroid disease    goiter (follows with Dr. Ronnald Collum)    Past Surgical History:  Procedure Laterality Date  . ABDOMINAL HYSTERECTOMY  1978   ovaries left in place  . APPENDECTOMY  1978  . BREAST BIOPSY Right  09/29/2017   Pt had Bx  done in [facility]'s office [place] @ 3:00 per chart-"FIBROCYSTIC CHANGES".  . COLONOSCOPY WITH PROPOFOL N/A 07/11/2017   Procedure: COLONOSCOPY WITH PROPOFOL;  Surgeon: Lucilla Lame, MD;  Location: [facility] ENDOSCOPY;  Service: Endoscopy;  Laterality: N/A;  . TUBAL LIGATION      Family History  Problem Relation Age of Onset  . Stroke Mother   . Hypertension Mother   . Arthritis Mother   . Breast cancer Sister 46  . Breast cancer Sister 65  . Lung cancer Brother   . Diabetes Brother     SOCIAL HX: reviewed.    Current Outpatient Medications:  .  atorvastatin (LIPITOR) 40 MG tablet, TAKE 1 TABLET EVERY DAY, Disp: 90 tablet, Rfl: 3 .  diltiazem (TIAZAC) 360 MG 24 hr capsule, TAKE 1 CAPSULE EVERY DAY, Disp: 90 capsule, Rfl: 3 .  EPINEPHrine (EPIPEN 2-PAK) 0.3 mg/0.3 mL IJ SOAJ injection, USE AS DIRECTED, Disp: 2 Device, Rfl: 0 .  levothyroxine (SYNTHROID, LEVOTHROID) 25 MCG tablet, Take 25 mcg by mouth daily before breakfast., Disp: , Rfl:  .  losartan (COZAAR) 100 MG tablet, TAKE 1 TABLET EVERY DAY, Disp: 90 tablet, Rfl: 3 .  metaxalone (SKELAXIN) 800 MG tablet, 1/2-1 whole tablet if needed every 8 hours for muscle pain., Disp: 30 tablet, Rfl: 0 .  nystatin cream (MYCOSTATIN), Apply 1 application topically 2 (two) times daily. Apply to affected area (on vagina) bid, Disp: 30 g, Rfl: 0 .  Polyvinyl Alcohol-Povidone (REFRESH OP), Apply to eye., Disp: , Rfl:   EXAM:  GENERAL: alert, oriented, appears well and in no acute distress  HEENT: atraumatic, conjunttiva clear, no obvious abnormalities on inspection of external nose and ears  NECK: normal movements of the head and neck  LUNGS: on inspection no signs of respiratory distress, breathing rate appears normal, no obvious gross SOB, gasping or wheezing  CV: no obvious cyanosis  PSYCH/NEURO: pleasant and cooperative, no obvious depression or anxiety, speech and thought processing grossly intact  ASSESSMENT AND  PLAN:  Discussed the following assessment and plan:  Anemia, unspecified type  Diabetes mellitus without complication ([place]) - Plan: Hemoglobin M4C, Basic metabolic panel, Microalbumin / creatinine urine ratio  Environmental allergies  Essential hypertension, benign  Hypercholesterolemia - Plan: Hepatic function panel, Lipid panel  Thyroid disease  Lip swelling  Anemia Follow cbc.   Diabetes mellitus without complication (HCC) Low carb diet and exercise.  Follow met b and a1c.  a1c just checked - 6.7.    Environmental allergies Has an allergy to wasp.  Recent lip swelling.  Will refer for allergy testing.    Essential hypertension, benign Blood pressure has previously been under reasonable control.  Continue same medication regimen.  Follow pressures.  Follow metabolic panel.   Hypercholesterolemia On lipitor.  Low cholesterol diet and exercise.  Follow lipid panel and liver function tests.    Thyroid disease Followed by Dr Carmela Rima.  Recent thyroid panel wnl.   Lip swelling Previous lip swelling as outlined.  Treated with prednisone.  Off prednisone now.  No further swelling.  Pt request referral to an allergist for further testing.      I discussed the assessment and treatment plan with the patient. The patient was provided an opportunity to ask questions and all were answered. The patient agreed with the plan and demonstrated an understanding of the instructions.   The patient was advised to call back or seek an in-person evaluation if the symptoms worsen or if the condition fails to improve as anticipated.  I provided 15 minutes of non-face-to-face time during this encounter.   Einar Pheasant, MD

## 2019-03-30 ENCOUNTER — Encounter: Payer: Self-pay | Admitting: Internal Medicine

## 2019-03-30 DIAGNOSIS — R220 Localized swelling, mass and lump, head: Secondary | ICD-10-CM | POA: Insufficient documentation

## 2019-03-30 NOTE — Assessment & Plan Note (Signed)
Follow cbc.  

## 2019-03-30 NOTE — Assessment & Plan Note (Signed)
Followed by Dr Carmela Rima.  Recent thyroid panel wnl.

## 2019-03-30 NOTE — Assessment & Plan Note (Signed)
Blood pressure has previously been under reasonable control.  Continue same medication regimen.  Follow pressures.  Follow metabolic panel.

## 2019-03-30 NOTE — Assessment & Plan Note (Signed)
On lipitor.  Low cholesterol diet and exercise.  Follow lipid panel and liver function tests.   

## 2019-03-30 NOTE — Assessment & Plan Note (Signed)
Low carb diet and exercise.  Follow met b and a1c.  a1c just checked - 6.7.  

## 2019-03-30 NOTE — Assessment & Plan Note (Signed)
Previous lip swelling as outlined.  Treated with prednisone.  Off prednisone now.  No further swelling.  Pt request referral to an allergist for further testing.

## 2019-03-30 NOTE — Assessment & Plan Note (Signed)
Has an allergy to wasp.  Recent lip swelling.  Will refer for allergy testing.

## 2019-07-31 ENCOUNTER — Other Ambulatory Visit: Payer: Self-pay | Admitting: Internal Medicine

## 2019-07-31 DIAGNOSIS — Z1231 Encounter for screening mammogram for malignant neoplasm of breast: Secondary | ICD-10-CM

## 2019-08-06 ENCOUNTER — Other Ambulatory Visit (INDEPENDENT_AMBULATORY_CARE_PROVIDER_SITE_OTHER): Payer: Medicare HMO

## 2019-08-06 ENCOUNTER — Other Ambulatory Visit: Payer: Self-pay

## 2019-08-06 DIAGNOSIS — E7800 Pure hypercholesterolemia, unspecified: Secondary | ICD-10-CM | POA: Diagnosis not present

## 2019-08-06 DIAGNOSIS — E119 Type 2 diabetes mellitus without complications: Secondary | ICD-10-CM | POA: Diagnosis not present

## 2019-08-06 LAB — HEPATIC FUNCTION PANEL
ALT: 11 U/L (ref 0–35)
AST: 11 U/L (ref 0–37)
Albumin: 4.5 g/dL (ref 3.5–5.2)
Alkaline Phosphatase: 72 U/L (ref 39–117)
Bilirubin, Direct: 0.2 mg/dL (ref 0.0–0.3)
Total Bilirubin: 1 mg/dL (ref 0.2–1.2)
Total Protein: 7.6 g/dL (ref 6.0–8.3)

## 2019-08-06 LAB — BASIC METABOLIC PANEL
BUN: 11 mg/dL (ref 6–23)
CO2: 28 mEq/L (ref 19–32)
Calcium: 9.9 mg/dL (ref 8.4–10.5)
Chloride: 103 mEq/L (ref 96–112)
Creatinine, Ser: 0.85 mg/dL (ref 0.40–1.20)
GFR: 78.63 mL/min (ref >60.00)
Glucose, Bld: 94 mg/dL (ref 70–99)
Potassium: 4 mEq/L (ref 3.5–5.1)
Sodium: 138 mEq/L (ref 135–145)

## 2019-08-06 LAB — LIPID PANEL
Cholesterol: 176 mg/dL (ref 0–200)
HDL: 54.1 mg/dL (ref >39.00)
LDL Cholesterol: 97 mg/dL (ref 0–99)
NonHDL: 122.21
Total CHOL/HDL Ratio: 3
Triglycerides: 125 mg/dL (ref 0.0–149.0)
VLDL: 25 mg/dL (ref 0.0–40.0)

## 2019-08-06 LAB — MICROALBUMIN / CREATININE URINE RATIO
Creatinine,U: 10.5 mg/dL
Microalb Creat Ratio: 6.7 mg/g (ref 0.0–30.0)
Microalb, Ur: <0.7 mg/dL (ref 0.0–1.9)

## 2019-08-06 LAB — HEMOGLOBIN A1C: Hgb A1c MFr Bld: 6.6 % — ABNORMAL HIGH (ref 4.6–6.5)

## 2019-08-08 ENCOUNTER — Ambulatory Visit (INDEPENDENT_AMBULATORY_CARE_PROVIDER_SITE_OTHER): Payer: Medicare HMO | Admitting: Internal Medicine

## 2019-08-08 ENCOUNTER — Encounter: Payer: Self-pay | Admitting: Internal Medicine

## 2019-08-08 ENCOUNTER — Other Ambulatory Visit: Payer: Self-pay

## 2019-08-08 VITALS — BP 150/72 | HR 78 | Temp 97.2°F | Resp 16 | Ht 63.0 in | Wt 142.0 lb

## 2019-08-08 DIAGNOSIS — Z9109 Other allergy status, other than to drugs and biological substances: Secondary | ICD-10-CM | POA: Diagnosis not present

## 2019-08-08 DIAGNOSIS — I10 Essential (primary) hypertension: Secondary | ICD-10-CM | POA: Diagnosis not present

## 2019-08-08 DIAGNOSIS — G479 Sleep disorder, unspecified: Secondary | ICD-10-CM

## 2019-08-08 DIAGNOSIS — E119 Type 2 diabetes mellitus without complications: Secondary | ICD-10-CM | POA: Diagnosis not present

## 2019-08-08 DIAGNOSIS — Z0000 Encounter for general adult medical examination without abnormal findings: Secondary | ICD-10-CM

## 2019-08-08 DIAGNOSIS — R011 Cardiac murmur, unspecified: Secondary | ICD-10-CM

## 2019-08-08 DIAGNOSIS — E7800 Pure hypercholesterolemia, unspecified: Secondary | ICD-10-CM | POA: Diagnosis not present

## 2019-08-08 DIAGNOSIS — E079 Disorder of thyroid, unspecified: Secondary | ICD-10-CM

## 2019-08-08 DIAGNOSIS — E2839 Other primary ovarian failure: Secondary | ICD-10-CM

## 2019-08-08 DIAGNOSIS — D649 Anemia, unspecified: Secondary | ICD-10-CM

## 2019-08-08 MED ORDER — DILTIAZEM HCL ER BEADS 360 MG PO CP24: 360.0000 mg | ORAL_CAPSULE | Freq: Every day | ORAL | 3 refills | Status: DC

## 2019-08-08 MED ORDER — ATORVASTATIN CALCIUM 40 MG PO TABS: 40.0000 mg | ORAL_TABLET | Freq: Every day | ORAL | 3 refills | Status: DC

## 2019-08-08 MED ORDER — LOSARTAN POTASSIUM 100 MG PO TABS: 100.0000 mg | ORAL_TABLET | Freq: Every day | ORAL | 3 refills | Status: DC

## 2019-08-08 NOTE — Patient Instructions (Signed)
Melatonin '3mg'$  - take one tablet 1-2 hours before bed

## 2019-08-08 NOTE — Assessment & Plan Note (Addendum)
Physical today 08/08/19.  Mammogram 09/07/18 - Birads I.  Colonoscopy 07/11/17.  Recommended f/u in 5 years.  Schedule f/u mammogram and f/u bone density.

## 2019-08-08 NOTE — Progress Notes (Signed)
Patient ID: Christina Cole, female   DOB: 11/13/1940, 78 y.o.   MRN: [id number]   Subjective:    Patient ID: Christina Cole, female    DOB: 03-05-1941, 78 y.o.   MRN: [id number]  HPI  Patient here for her physical exam.  She reports she is doing relatively well.  Recently referred to an allergist for evaluation - lip swelling.  No further episodes.  No chest pain.  No sob.  No acid reflux.  No abdominal pain.  bowelsl moving.  Trouble sleeping.  Trouble staying asleep.  Discussed treatment options.  Discussed melatonin.  Discussed recent labs.  Also discussed f/u bone density.  Discussed shingrix.     Past Medical History:  Diagnosis Date  . Heart murmur   . Hyperlipidemia   . Hypertension   . Thyroid disease    goiter (follows with Dr. Ronnald Collum)   Past Surgical History:  Procedure Laterality Date  . ABDOMINAL HYSTERECTOMY  1978   ovaries left in place  . APPENDECTOMY  1978  . BREAST BIOPSY Right 09/29/2017   Pt had Bx  done in [facility]'s office [place] @ 3:00 per chart-"FIBROCYSTIC CHANGES".  . COLONOSCOPY WITH PROPOFOL N/A 07/11/2017   Procedure: COLONOSCOPY WITH PROPOFOL;  Surgeon: Lucilla Lame, MD;  Location: [facility] ENDOSCOPY;  Service: Endoscopy;  Laterality: N/A;  . TUBAL LIGATION     Family History  Problem Relation Age of Onset  . Stroke Mother   . Hypertension Mother   . Arthritis Mother   . Breast cancer Sister 32  . Breast cancer Sister 54  . Lung cancer Brother   . Diabetes Brother    Social History   Socioeconomic History  . Marital status: Single    Spouse name: Not on file  . Number of children: 3  . Years of education: Not on file  . Highest education level: Not on file  Occupational History  . Not on file  Social Needs  . Financial resource strain: Not on file  . Food insecurity    Worry: Not on file    Inability: Not on file  . Transportation needs    Medical: Not on file    Non-medical: Not on file  Tobacco Use  . Smoking status: Former Research scientist (life sciences)   . Smokeless tobacco: Never Used  Substance and Sexual Activity  . Alcohol use: No    Alcohol/week: 0.0 standard drinks  . Drug use: No  . Sexual activity: Not on file  Lifestyle  . Physical activity    Days per week: Not on file    Minutes per session: Not on file  . Stress: Not on file  Relationships  . Social Herbalist on phone: Not on file    Gets together: Not on file    Attends religious service: Not on file    Active member of club or organization: Not on file    Attends meetings of clubs or organizations: Not on file    Relationship status: Not on file  Other Topics Concern  . Not on file  Social History Narrative  . Not on file    Outpatient Encounter Medications as of 08/08/2019  Medication Sig  . atorvastatin (LIPITOR) 40 MG tablet Take 1 tablet (40 mg total) by mouth daily.  Marland Kitchen diltiazem (TIAZAC) 360 MG 24 hr capsule Take 1 capsule (360 mg total) by mouth daily.  Marland Kitchen EPINEPHrine (EPIPEN 2-PAK) 0.3 mg/0.3 mL IJ SOAJ injection USE AS DIRECTED  . levothyroxine (SYNTHROID,  LEVOTHROID) 25 MCG tablet Take 25 mcg by mouth daily before breakfast.  . losartan (COZAAR) 100 MG tablet Take 1 tablet (100 mg total) by mouth daily.  . metaxalone (SKELAXIN) 800 MG tablet 1/2-1 whole tablet if needed every 8 hours for muscle pain.  Marland Kitchen nystatin cream (MYCOSTATIN) Apply 1 application topically 2 (two) times daily. Apply to affected area (on vagina) bid  . Polyvinyl Alcohol-Povidone (REFRESH OP) Apply to eye.  . [DISCONTINUED] atorvastatin (LIPITOR) 40 MG tablet TAKE 1 TABLET EVERY DAY  . [DISCONTINUED] diltiazem (TIAZAC) 360 MG 24 hr capsule TAKE 1 CAPSULE EVERY DAY  . [DISCONTINUED] losartan (COZAAR) 100 MG tablet TAKE 1 TABLET EVERY DAY   No facility-administered encounter medications on file as of 08/08/2019.    Review of Systems  Constitutional: Negative for appetite change and unexpected weight change.  HENT: Negative for congestion and sinus pressure.   Eyes: Negative  for pain and visual disturbance.  Respiratory: Negative for cough, chest tightness and shortness of breath.   Cardiovascular: Negative for chest pain, palpitations and leg swelling.  Gastrointestinal: Negative for abdominal pain, diarrhea, nausea and vomiting.  Genitourinary: Negative for difficulty urinating and dysuria.  Musculoskeletal: Negative for joint swelling and myalgias.  Skin: Negative for color change and rash.  Neurological: Negative for dizziness, light-headedness and headaches.  Hematological: Negative for adenopathy. Does not bruise/bleed easily.  Psychiatric/Behavioral: Negative for agitation and dysphoric mood.       Objective:    Physical Exam Constitutional:      General: She is not in acute distress.    Appearance: Normal appearance. She is well-developed.  HENT:     Head: Normocephalic and atraumatic.     Right Ear: External ear normal.  Eyes:     General: No scleral icterus.       Right eye: No discharge.        Left eye: No discharge.     Conjunctiva/sclera: Conjunctivae normal.  Neck:     Musculoskeletal: Neck supple. No muscular tenderness.     Thyroid: No thyromegaly.  Cardiovascular:     Rate and Rhythm: Normal rate and regular rhythm.     Comments: 7-6/1 systolic murmur Pulmonary:     Effort: No tachypnea, accessory muscle usage or respiratory distress.     Breath sounds: Normal breath sounds. No decreased breath sounds or wheezing.  Chest:     Breasts:        Right: No inverted nipple, mass, nipple discharge or tenderness (no axillary adenopathy).        Left: No inverted nipple, mass, nipple discharge or tenderness (no axilarry adenopathy).  Abdominal:     General: Bowel sounds are normal.     Palpations: Abdomen is soft.     Tenderness: There is no abdominal tenderness.  Musculoskeletal:        General: No swelling or tenderness.  Lymphadenopathy:     Cervical: No cervical adenopathy.  Skin:    Findings: No erythema or rash.   Neurological:     Mental Status: She is alert and oriented to person, place, and time.  Psychiatric:        Mood and Affect: Mood normal.        Behavior: Behavior normal.     BP (!) 150/72   Pulse 78   Temp (!) 97.2 F (36.2 C)   Resp 16   Ht '5\' 3"'  (1.6 m)   Wt 142 lb (64.4 kg)   LMP 12/21/1976   SpO2 98%  BMI 25.15 kg/m  Wt Readings from Last 3 Encounters:  08/08/19 142 lb (64.4 kg)  02/26/19 141 lb 9.6 oz (64.2 kg)  11/21/18 147 lb 12.8 oz (67 kg)     Lab Results  Component Value Date   WBC 5.1 11/19/2018   HGB 13.1 11/19/2018   HCT 39.7 11/19/2018   PLT 246.0 11/19/2018   GLUCOSE 94 08/06/2019   CHOL 176 08/06/2019   TRIG 125.0 08/06/2019   HDL 54.10 08/06/2019   LDLCALC 97 08/06/2019   ALT 11 08/06/2019   AST 11 08/06/2019   NA 138 08/06/2019   K 4.0 08/06/2019   CL 103 08/06/2019   CREATININE 0.85 08/06/2019   BUN 11 08/06/2019   CO2 28 08/06/2019   TSH 0.95 03/08/2019   HGBA1C 6.6 (H) 08/06/2019   MICROALBUR <0.7 08/06/2019       Assessment & Plan:   Problem List Items Addressed This Visit    Anemia    Follow cbc.        Diabetes mellitus without complication (HCC)    Low carb diet and exercise.  Follow met b and a1c.        Relevant Medications   atorvastatin (LIPITOR) 40 MG tablet   losartan (COZAAR) 100 MG tablet   Environmental allergies    Seeing an allergist.        Essential hypertension, benign    Blood pressure elevated today.  Have her spot check her pressure. Get her back in soon to reassess.  Follow pressures.  Follow metabolic panel.        Relevant Medications   atorvastatin (LIPITOR) 40 MG tablet   diltiazem (TIAZAC) 360 MG 24 hr capsule   losartan (COZAAR) 100 MG tablet   Health care maintenance    Physical today 08/08/19.  Mammogram 09/07/18 - Birads I.  Colonoscopy 07/11/17.  Recommended f/u in 5 years.  Schedule f/u mammogram and f/u bone density.        Heart murmur    Murmur more prominent on exam.  Check  echo.        Relevant Orders   ECHOCARDIOGRAM COMPLETE   Hypercholesterolemia    On lipitor.  Low cholesterol diet and exercise.  Follow lipid panel and liver function tests.        Relevant Medications   atorvastatin (LIPITOR) 40 MG tablet   diltiazem (TIAZAC) 360 MG 24 hr capsule   losartan (COZAAR) 100 MG tablet   Sleep difficulties    Discussed treatment options.  Start melatonin.  Follow.       Thyroid disease    On thyroid replacement.  Follow tsh.        Other Visit Diagnoses    Estrogen deficiency    -  Primary   Relevant Orders   DG Bone Density       Einar Pheasant, MD

## 2019-08-11 ENCOUNTER — Encounter: Payer: Self-pay | Admitting: Internal Medicine

## 2019-08-11 DIAGNOSIS — G479 Sleep disorder, unspecified: Secondary | ICD-10-CM | POA: Insufficient documentation

## 2019-08-11 DIAGNOSIS — R011 Cardiac murmur, unspecified: Secondary | ICD-10-CM | POA: Insufficient documentation

## 2019-08-11 NOTE — Assessment & Plan Note (Signed)
Low carb diet and exercise.  Follow met b and a1c.   

## 2019-08-11 NOTE — Assessment & Plan Note (Signed)
Follow cbc.  

## 2019-08-11 NOTE — Assessment & Plan Note (Signed)
Discussed treatment options.  Start melatonin.  Follow.

## 2019-08-11 NOTE — Assessment & Plan Note (Signed)
Seeing an allergist.

## 2019-08-11 NOTE — Assessment & Plan Note (Signed)
Murmur more prominent on exam.  Check echo.

## 2019-08-11 NOTE — Assessment & Plan Note (Signed)
Blood pressure elevated today.  Have her spot check her pressure. Get her back in soon to reassess.  Follow pressures.  Follow metabolic panel.

## 2019-08-11 NOTE — Assessment & Plan Note (Signed)
On lipitor.  Low cholesterol diet and exercise.  Follow lipid panel and liver function tests.   

## 2019-08-11 NOTE — Assessment & Plan Note (Signed)
On thyroid replacement.  Follow tsh.  

## 2019-09-09 ENCOUNTER — Ambulatory Visit
Admission: RE | Admit: 2019-09-09 | Discharge: 2019-09-09 | Disposition: A | Discharge status: Home / Self Care | Payer: Medicare HMO | Source: Ambulatory Visit | Attending: Internal Medicine | Admitting: Internal Medicine

## 2019-09-09 DIAGNOSIS — Z1231 Encounter for screening mammogram for malignant neoplasm of breast: Secondary | ICD-10-CM | POA: Insufficient documentation

## 2019-09-30 ENCOUNTER — Ambulatory Visit (INDEPENDENT_AMBULATORY_CARE_PROVIDER_SITE_OTHER): Payer: Medicare HMO | Admitting: Cardiology

## 2019-09-30 ENCOUNTER — Other Ambulatory Visit: Payer: Self-pay

## 2019-09-30 ENCOUNTER — Telehealth: Payer: Self-pay

## 2019-09-30 ENCOUNTER — Encounter: Payer: Self-pay | Admitting: Cardiology

## 2019-09-30 ENCOUNTER — Telehealth: Payer: Self-pay | Admitting: Cardiology

## 2019-09-30 VITALS — BP 160/90 | HR 70 | Temp 97.0°F | Ht 63.0 in | Wt 143.2 lb

## 2019-09-30 DIAGNOSIS — R011 Cardiac murmur, unspecified: Secondary | ICD-10-CM

## 2019-09-30 DIAGNOSIS — I10 Essential (primary) hypertension: Secondary | ICD-10-CM | POA: Diagnosis not present

## 2019-09-30 MED ORDER — HYDROCHLOROTHIAZIDE 25 MG PO TABS: 25.0000 mg | ORAL_TABLET | Freq: Every day | ORAL | 1 refills | Status: DC

## 2019-09-30 NOTE — Patient Instructions (Addendum)
Medication Instructions:  Your physician has recommended you make the following change in your medication:   START HCTZ 25mg  daily. An Rx has been sent to your pharmacy.  *If you need a refill on your cardiac medications before your next appointment, please call your pharmacy*  Lab Work: None ordered If you have labs (blood work) drawn today and your tests are completely normal, you will receive your results only by: Marland Kitchen MyChart Message (if you have MyChart) OR . A paper copy in the mail If you have any lab test that is abnormal or we need to change your treatment, we will call you to review the results.  Testing/Procedures: Your physician has requested that you have an echocardiogram. Echocardiography is a painless test that uses sound waves to create images of your heart. It provides your doctor with information about the size and shape of your heart and how well your heart's chambers and valves are working. This procedure takes approximately one hour. There are no restrictions for this procedure.    Follow-Up: At [facility], you and your health needs are our priority.  As part of our continuing mission to provide you with exceptional heart care, we have created designated Provider Care Teams.  These Care Teams include your primary Cardiologist (physician) and Advanced Practice Providers (APPs -  Physician Assistants and Nurse Practitioners) who all work together to provide you with the care you need, when you need it.  Your next appointment:   2 month(s)  The format for your next appointment:   In Person  Provider:    You may see Kate Sable, MD or one of the following Advanced Practice Providers on your designated Care Team:    Murray Hodgkins, NP  Christell Faith, PA-C  Marrianne Mood, PA-C   Other Instructions   Echocardiogram An echocardiogram is a procedure that uses painless sound waves (ultrasound) to produce an image of the heart. Images from an  echocardiogram can provide important information about:  Signs of coronary artery disease (CAD).  Aneurysm detection. An aneurysm is a weak or damaged part of an artery wall that bulges out from the normal force of blood pumping through the body.  Heart size and shape. Changes in the size or shape of the heart can be associated with certain conditions, including heart failure, aneurysm, and CAD.  Heart muscle function.  Heart valve function.  Signs of a past heart attack.  Fluid buildup around the heart.  Thickening of the heart muscle.  A tumor or infectious growth around the heart valves. Tell a health care provider about:  Any allergies you have.  All medicines you are taking, including vitamins, herbs, eye drops, creams, and over-the-counter medicines.  Any blood disorders you have.  Any surgeries you have had.  Any medical conditions you have.  Whether you are pregnant or may be pregnant. What are the risks? Generally, this is a safe procedure. However, problems may occur, including:  Allergic reaction to dye (contrast) that may be used during the procedure. What happens before the procedure? No specific preparation is needed. You may eat and drink normally. What happens during the procedure?   An IV tube may be inserted into one of your veins.  You may receive contrast through this tube. A contrast is an injection that improves the quality of the pictures from your heart.  A gel will be applied to your chest.  A wand-like tool (transducer) will be moved over your chest. The gel will help  to transmit the sound waves from the transducer.  The sound waves will harmlessly bounce off of your heart to allow the heart images to be captured in real-time motion. The images will be recorded on a computer. The procedure may vary among health care providers and hospitals. What happens after the procedure?  You may return to your normal, everyday life, including diet,  activities, and medicines, unless your health care provider tells you not to do that. Summary  An echocardiogram is a procedure that uses painless sound waves (ultrasound) to produce an image of the heart.  Images from an echocardiogram can provide important information about the size and shape of your heart, heart muscle function, heart valve function, and fluid buildup around your heart.  You do not need to do anything to prepare before this procedure. You may eat and drink normally.  After the echocardiogram is completed, you may return to your normal, everyday life, unless your health care provider tells you not to do that. This information is not intended to replace advice given to you by your health care provider. Make sure you discuss any questions you have with your health care provider. Document Released: 10/14/2000 Document Revised: 02/07/2019 Document Reviewed: 11/19/2016 Elsevier Patient Education  2020 [employer].

## 2019-09-30 NOTE — Progress Notes (Signed)
Cardiology Office Note:    Date:  09/30/2019   ID:  Christina Cole, DOB [birth year], MRN [id number]  PCP:  Einar Pheasant, MD  Cardiologist:  Kate Sable, MD  Electrophysiologist:  None   Referring MD: Einar Pheasant, MD   Chief Complaint  Patient presents with  . New Patient (Initial Visit)    Hx of heart murmur; Meds verbally reviewed with patient.    History of Present Illness:    Christina Cole is a 78 y.o. female with a hx of hypertension, hyperlipidemia, cardiac murmur who presents due to a murmur.  Patient states being told having cardiac murmur for a while now.  She denies any history of heart disease.  She denies chest pain or shortness of breath at rest or with exertion.  Denies edema, orthopnea, palpitations.  She has 2 siblings who had MIs in the 28s.  She has had hypertension for years now.  Currently takes losartan and diltiazem.  Recently saw her PCP who referred her due to cardiac murmur.  Past Medical History:  Diagnosis Date  . Heart murmur   . Hyperlipidemia   . Hypertension   . Thyroid disease    goiter (follows with Dr. Ronnald Collum)    Past Surgical History:  Procedure Laterality Date  . ABDOMINAL HYSTERECTOMY  1978   ovaries left in place  . APPENDECTOMY  1978  . BREAST BIOPSY Right 09/29/2017   Pt had Bx  done in [facility] [place] @ 3:00 per chart-"FIBROCYSTIC CHANGES".  . COLONOSCOPY WITH PROPOFOL N/A 07/11/2017   Procedure: COLONOSCOPY WITH PROPOFOL;  Surgeon: Lucilla Lame, MD;  Location: [facility] ENDOSCOPY;  Service: Endoscopy;  Laterality: N/A;  . TUBAL LIGATION      Current Medications: Current Meds  Medication Sig  . atorvastatin (LIPITOR) 40 MG tablet Take 1 tablet (40 mg total) by mouth daily.  Marland Kitchen diltiazem (TIAZAC) 360 MG 24 hr capsule Take 1 capsule (360 mg total) by mouth daily.  Marland Kitchen EPINEPHrine (EPIPEN 2-PAK) 0.3 mg/0.3 mL IJ SOAJ injection USE AS DIRECTED  . levothyroxine (SYNTHROID, LEVOTHROID) 25 MCG tablet Take 25 mcg by  mouth daily before breakfast.  . losartan (COZAAR) 100 MG tablet Take 1 tablet (100 mg total) by mouth daily.  . Polyvinyl Alcohol-Povidone (REFRESH OP) Apply to eye.     Allergies:   Sulfa antibiotics   Social History   Socioeconomic History  . Marital status: Single    Spouse name: Not on file  . Number of children: 3  . Years of education: Not on file  . Highest education level: Not on file  Occupational History  . Not on file  Social Needs  . Financial resource strain: Not on file  . Food insecurity    Worry: Not on file    Inability: Not on file  . Transportation needs    Medical: Not on file    Non-medical: Not on file  Tobacco Use  . Smoking status: Former Research scientist (life sciences)  . Smokeless tobacco: Never Used  Substance and Sexual Activity  . Alcohol use: No    Alcohol/week: 0.0 standard drinks  . Drug use: No  . Sexual activity: Not on file  Lifestyle  . Physical activity    Days per week: Not on file    Minutes per session: Not on file  . Stress: Not on file  Relationships  . Social Herbalist on phone: Not on file    Gets together: Not on file    Attends  religious service: Not on file    Active member of club or organization: Not on file    Attends meetings of clubs or organizations: Not on file    Relationship status: Not on file  Other Topics Concern  . Not on file  Social History Narrative  . Not on file     Family History: The patient's family history includes Arthritis in her mother; Breast cancer (age of onset: 44) in her sister; Breast cancer (age of onset: 66) in her sister; Diabetes in her brother; Hypertension in her mother; Lung cancer in her brother; Stroke in her mother.  ROS:   Please see the history of present illness.     All other systems reviewed and are negative.  EKGs/Labs/Other Studies Reviewed:    The following studies were reviewed today:   EKG:  EKG is  ordered today.  The ekg ordered today demonstrates normal sinus rhythm,  normal ECG.  Recent Labs: 11/19/2018: Hemoglobin 13.1; Platelets 246.0 03/08/2019: TSH 0.95 08/06/2019: ALT 11; BUN 11; Creatinine, Ser 0.85; Potassium 4.0; Sodium 138  Recent Lipid Panel    Component Value Date/Time   CHOL 176 08/06/2019 0819   TRIG 125.0 08/06/2019 0819   HDL 54.10 08/06/2019 0819   CHOLHDL 3 08/06/2019 0819   VLDL 25.0 08/06/2019 0819   LDLCALC 97 08/06/2019 0819    Physical Exam:    VS:  BP (!) 160/90 (BP Location: Right Arm, Patient Position: Sitting, Cuff Size: Normal)   Pulse 70   Temp (!) 97 F (36.1 C)   Ht 5\' 3"  (1.6 m)   Wt 143 lb 4 oz (65 kg)   LMP 12/21/1976   SpO2 98%   BMI 25.38 kg/m     Wt Readings from Last 3 Encounters:  09/30/19 143 lb 4 oz (65 kg)  08/08/19 142 lb (64.4 kg)  02/26/19 141 lb 9.6 oz (64.2 kg)     GEN:  Well nourished, well developed in no acute distress HEENT: Normal NECK: No JVD; No carotid bruits LYMPHATICS: No lymphadenopathy CARDIAC: RRR, 2/6 systolic murmur right upper sternal border.  No, rubs, no gallops RESPIRATORY:  Clear to auscultation without rales, wheezing or rhonchi  ABDOMEN: Soft, non-tender, non-distended MUSCULOSKELETAL:  No edema; No deformity  SKIN: Warm and dry NEUROLOGIC:  Alert and oriented x 3 PSYCHIATRIC:  Normal affect   ASSESSMENT:   2/6 systolic murmur noted on cardiac exam.  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  Her blood pressure is also not well controlled with BP of 160/90. 1. Cardiac murmur   2. Essential hypertension    PLAN:    In order of problems listed above:  1. Get echocardiogram. 2. Start HCTZ 25 mg daily.  Continue losartan 100 mg daily, continue diltiazem 360 daily.  Patient encouraged to check blood pressure daily.  Follow-up in 2 months.   Medication Adjustments/Labs and Tests Ordered: Current medicines are reviewed at length with the patient today.  Concerns regarding medicines are outlined above.  Orders Placed This Encounter  Procedures  .  EKG 12-Lead  . ECHOCARDIOGRAM COMPLETE   Meds ordered this encounter  Medications  . hydrochlorothiazide (HYDRODIURIL) 25 MG tablet    Sig: Take 1 tablet (25 mg total) by mouth daily.    Dispense:  90 tablet    Refill:  1    Patient Instructions  Medication Instructions:  Your physician has recommended you make the following change in your medication:   START HCTZ 25mg  daily. An Rx has  been sent to your pharmacy.  *If you need a refill on your cardiac medications before your next appointment, please call your pharmacy*  Lab Work: None ordered If you have labs (blood work) drawn today and your tests are completely normal, you will receive your results only by: Marland Kitchen MyChart Message (if you have MyChart) OR . A paper copy in the mail If you have any lab test that is abnormal or we need to change your treatment, we will call you to review the results.  Testing/Procedures: Your physician has requested that you have an echocardiogram. Echocardiography is a painless test that uses sound waves to create images of your heart. It provides your doctor with information about the size and shape of your heart and how well your heart's chambers and valves are working. This procedure takes approximately one hour. There are no restrictions for this procedure.    Follow-Up: At [facility], you and your health needs are our priority.  As part of our continuing mission to provide you with exceptional heart care, we have created designated Provider Care Teams.  These Care Teams include your primary Cardiologist (physician) and Advanced Practice Providers (APPs -  Physician Assistants and Nurse Practitioners) who all work together to provide you with the care you need, when you need it.  Your next appointment:   2 month(s)  The format for your next appointment:   In Person  Provider:    You may see Kate Sable, MD or one of the following Advanced Practice Providers on your designated Care  Team:    Murray Hodgkins, NP  Christell Faith, PA-C  Marrianne Mood, PA-C   Other Instructions   Echocardiogram An echocardiogram is a procedure that uses painless sound waves (ultrasound) to produce an image of the heart. Images from an echocardiogram can provide important information about:  Signs of coronary artery disease (CAD).  Aneurysm detection. An aneurysm is a weak or damaged part of an artery wall that bulges out from the normal force of blood pumping through the body.  Heart size and shape. Changes in the size or shape of the heart can be associated with certain conditions, including heart failure, aneurysm, and CAD.  Heart muscle function.  Heart valve function.  Signs of a past heart attack.  Fluid buildup around the heart.  Thickening of the heart muscle.  A tumor or infectious growth around the heart valves. Tell a health care provider about:  Any allergies you have.  All medicines you are taking, including vitamins, herbs, eye drops, creams, and over-the-counter medicines.  Any blood disorders you have.  Any surgeries you have had.  Any medical conditions you have.  Whether you are pregnant or may be pregnant. What are the risks? Generally, this is a safe procedure. However, problems may occur, including:  Allergic reaction to dye (contrast) that may be used during the procedure. What happens before the procedure? No specific preparation is needed. You may eat and drink normally. What happens during the procedure?   An IV tube may be inserted into one of your veins.  You may receive contrast through this tube. A contrast is an injection that improves the quality of the pictures from your heart.  A gel will be applied to your chest.  A wand-like tool (transducer) will be moved over your chest. The gel will help to transmit the sound waves from the transducer.  The sound waves will harmlessly bounce off of your heart to allow the heart images  to be captured in real-time motion. The images will be recorded on a computer. The procedure may vary among health care providers and hospitals. What happens after the procedure?  You may return to your normal, everyday life, including diet, activities, and medicines, unless your health care provider tells you not to do that. Summary  An echocardiogram is a procedure that uses painless sound waves (ultrasound) to produce an image of the heart.  Images from an echocardiogram can provide important information about the size and shape of your heart, heart muscle function, heart valve function, and fluid buildup around your heart.  You do not need to do anything to prepare before this procedure. You may eat and drink normally.  After the echocardiogram is completed, you may return to your normal, everyday life, unless your health care provider tells you not to do that. This information is not intended to replace advice given to you by your health care provider. Make sure you discuss any questions you have with your health care provider. Document Released: 10/14/2000 Document Revised: 02/07/2019 Document Reviewed: 11/19/2016 Elsevier Patient Education  2020 [place], Kate Sable, MD  09/30/2019 12:29 PM    [place]

## 2019-09-30 NOTE — Telephone Encounter (Signed)
Virtual Visit Pre-Appointment Phone Call  "(Name), I am calling you today to discuss your upcoming appointment. We are currently trying to limit exposure to the virus that causes COVID-19 by seeing patients at home rather than in the office."  1. "What is the BEST phone number to call the day of the visit?" - include this in appointment notes  2. Do you have or have access to (through a family member/friend) a smartphone with video capability that we can use for your visit?" a. If yes - list this number in appt notes as cell (if different from BEST phone #) and list the appointment type as a VIDEO visit in appointment notes b. If no - list the appointment type as a PHONE visit in appointment notes  3. Confirm consent - "In the setting of the current Covid19 crisis, you are scheduled for a (phone or video) visit with your provider on (date) at (time).  Just as we do with many in-office visits, in order for you to participate in this visit, we must obtain consent.  If you'd like, I can send this to your mychart (if signed up) or email for you to review.  Otherwise, I can obtain your verbal consent now.  All virtual visits are billed to your insurance company just like a normal visit would be.  By agreeing to a virtual visit, we'd like you to understand that the technology does not allow for your provider to perform an examination, and thus may limit your provider's ability to fully assess your condition. If your provider identifies any concerns that need to be evaluated in person, we will make arrangements to do so.  Finally, though the technology is pretty good, we cannot assure that it will always work on either your or our end, and in the setting of a video visit, we may have to convert it to a phone-only visit.  In either situation, we cannot ensure that we have a secure connection.  Are you willing to proceed?" STAFF: Did the patient verbally acknowledge consent to telehealth visit? Document  YES/NO here: YES  4. Advise patient to be prepared - "Two hours prior to your appointment, go ahead and check your blood pressure, pulse, oxygen saturation, and your weight (if you have the equipment to check those) and write them all down. When your visit starts, your provider will ask you for this information. If you have an Apple Watch or Kardia device, please plan to have heart rate information ready on the day of your appointment. Please have a pen and paper handy nearby the day of the visit as well."  5. Give patient instructions for MyChart download to smartphone OR Doximity/Doxy.me as below if video visit (depending on what platform provider is using)  6. Inform patient they will receive a phone call 15 minutes prior to their appointment time (may be from unknown caller ID) so they should be prepared to answer    TELEPHONE CALL NOTE  Christina Cole has been deemed a candidate for a follow-up tele-health visit to limit community exposure during the Covid-19 pandemic. I spoke with the patient via phone to ensure availability of phone/video source, confirm preferred email & phone number, and discuss instructions and expectations.  I reminded Carlissa Butterly to be prepared with any vital sign and/or heart rhythm information that could potentially be obtained via home monitoring, at the time of her visit. I reminded Conita Pille to expect a phone call prior to  her visit.  Clarisse Gouge 09/30/2019 11:43 AM   INSTRUCTIONS FOR DOWNLOADING THE MYCHART APP TO SMARTPHONE  - The patient must first make sure to have activated MyChart and know their login information - If Apple, go to [employer] and type in MyChart in the search bar and download the app. If Android, ask patient to go to [employer] and type in [place] in the search bar and download the app. The app is free but as with any other app downloads, their phone may require them to verify saved payment information or  Apple/Android password.  - The patient will need to then log into the app with their MyChart username and password, and select [facility] as their healthcare provider to link the account. When it is time for your visit, go to the MyChart app, find appointments, and click Begin Video Visit. Be sure to Select Allow for your device to access the Microphone and Camera for your visit. You will then be connected, and your provider will be with you shortly.  **If they have any issues connecting, or need assistance please contact MyChart service desk [phone])**  **If using a computer, in order to ensure the best quality for their visit they will need to use either of the following Internet Browsers: [employer], or Google Chrome**  IF USING DOXIMITY or DOXY.ME - The patient will receive a link just prior to their visit by text.     FULL LENGTH CONSENT FOR TELE-HEALTH VISIT   I hereby voluntarily request, consent and authorize [place] and its employed or contracted physicians, physician assistants, nurse practitioners or other licensed health care professionals (the Practitioner), to provide me with telemedicine health care services (the Services") as deemed necessary by the treating Practitioner. I acknowledge and consent to receive the Services by the Practitioner via telemedicine. I understand that the telemedicine visit will involve communicating with the Practitioner through live audiovisual communication technology and the disclosure of certain medical information by electronic transmission. I acknowledge that I have been given the opportunity to request an in-person assessment or other available alternative prior to the telemedicine visit and am voluntarily participating in the telemedicine visit.  I understand that I have the right to withhold or withdraw my consent to the use of telemedicine in the course of my care at any time, without affecting my right to future care  or treatment, and that the Practitioner or I may terminate the telemedicine visit at any time. I understand that I have the right to inspect all information obtained and/or recorded in the course of the telemedicine visit and may receive copies of available information for a reasonable fee.  I understand that some of the potential risks of receiving the Services via telemedicine include:   Delay or interruption in medical evaluation due to technological equipment failure or disruption;  Information transmitted may not be sufficient (e.g. poor resolution of images) to allow for appropriate medical decision making by the Practitioner; and/or   In rare instances, security protocols could fail, causing a breach of personal health information.  Furthermore, I acknowledge that it is my responsibility to provide information about my medical history, conditions and care that is complete and accurate to the best of my ability. I acknowledge that Practitioner's advice, recommendations, and/or decision may be based on factors not within their control, such as incomplete or inaccurate data provided by me or distortions of diagnostic images or specimens that may result from electronic transmissions. I  understand that the practice of medicine is not an exact science and that Practitioner makes no warranties or guarantees regarding treatment outcomes. I acknowledge that I will receive a copy of this consent concurrently upon execution via email to the email address I last provided but may also request a printed copy by calling the office of [facility].    I understand that my insurance will be billed for this visit.   I have read or had this consent read to me.  I understand the contents of this consent, which adequately explains the benefits and risks of the Services being provided via telemedicine.   I have been provided ample opportunity to ask questions regarding this consent and the Services and have had  my questions answered to my satisfaction.  I give my informed consent for the services to be provided through the use of telemedicine in my medical care  By participating in this telemedicine visit I agree to the above.

## 2019-09-30 NOTE — Telephone Encounter (Signed)
Patient was seen today by Dr. Garen Lah. Patient called back to ask for clarification regarding the medication changes made today. Advised the patient that she is to start HCTZ 25mg  qd in addition to her current medications. Patient verbalized understanding and voiced appreciation for the call back.

## 2019-10-10 ENCOUNTER — Telehealth: Payer: Self-pay

## 2019-10-10 NOTE — Telephone Encounter (Signed)
Copied from [place] [phone]. Topic: General - Other >> Oct 10, 2019  7:28 AM Carolyn Stare wrote: Pt called and said she does not want a virtual visit ,she would like come in the office

## 2019-10-10 NOTE — Telephone Encounter (Signed)
I think you spoke with this patient. Her appt has been rescheduled. Routing to you for documentation.

## 2019-10-10 NOTE — Telephone Encounter (Signed)
Called to get doxy info.. pt stated that she wanted to wait till after her Cardiology appts

## 2019-10-11 ENCOUNTER — Ambulatory Visit: Payer: Medicare HMO | Admitting: Internal Medicine

## 2019-11-06 ENCOUNTER — Other Ambulatory Visit: Payer: Medicare HMO

## 2019-12-02 ENCOUNTER — Ambulatory Visit: Payer: Medicare HMO | Admitting: Cardiology
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
## 2019-12-10 ENCOUNTER — Ambulatory Visit (INDEPENDENT_AMBULATORY_CARE_PROVIDER_SITE_OTHER): Payer: Medicare HMO | Admitting: Internal Medicine

## 2019-12-10 ENCOUNTER — Telehealth: Payer: Self-pay | Admitting: Internal Medicine

## 2019-12-10 VITALS — BP 146/68 | Ht 63.0 in | Wt 143.0 lb

## 2019-12-10 DIAGNOSIS — Z9109 Other allergy status, other than to drugs and biological substances: Secondary | ICD-10-CM

## 2019-12-10 DIAGNOSIS — R011 Cardiac murmur, unspecified: Secondary | ICD-10-CM | POA: Diagnosis not present

## 2019-12-10 DIAGNOSIS — E119 Type 2 diabetes mellitus without complications: Secondary | ICD-10-CM

## 2019-12-10 DIAGNOSIS — E7800 Pure hypercholesterolemia, unspecified: Secondary | ICD-10-CM | POA: Diagnosis not present

## 2019-12-10 DIAGNOSIS — I10 Essential (primary) hypertension: Secondary | ICD-10-CM | POA: Diagnosis not present

## 2019-12-10 DIAGNOSIS — D649 Anemia, unspecified: Secondary | ICD-10-CM | POA: Diagnosis not present

## 2019-12-10 DIAGNOSIS — H25813 Combined forms of age-related cataract, bilateral: Secondary | ICD-10-CM | POA: Diagnosis not present

## 2019-12-10 DIAGNOSIS — H524 Presbyopia: Secondary | ICD-10-CM | POA: Diagnosis not present

## 2019-12-10 NOTE — Progress Notes (Signed)
Patient ID: Christina Cole, female   DOB: 29-Jul-1941, 79 y.o.   MRN: [id number]   Virtual Visit via telephone Note  This visit type was conducted due to national recommendations for restrictions regarding the COVID-19 pandemic (e.g. social distancing).  This format is felt to be most appropriate for this patient at this time.  All issues noted in this document were discussed and addressed.  No physical exam was performed (except for noted visual exam findings with Video Visits).   I connected with Christina Cole today by telephone and verified that I am speaking with the correct person using two identifiers. Location patient: home Location provider: work  Persons participating in the virtual visit: patient, provider  I discussed the limitations, risks, security and privacy concerns of performing an evaluation and management service by telephone and the availability of in person appointments. The patient expressed understanding and agreed to proceed.   Reason for visit: scheduled follow up.   HPI: Recently evaluated by cardiology for murmur.  Recommended echo.  Has f/u planned in March.  Blood pressure elevated.  HCTZ added by cardiology.  Had some intolerance initially.  Now tolerating.  No chest pain or sob.  No chest congestion.  No fever.  Noticed - 10 days - eye drainage - clear.  Some swelling - eyelid (bilateral).  Left more than right.  Using cold compresses.  No sinus pressure.  Nose dry.  Minimal headache.  No significant headache.  No dizziness or light headedness.  No abdominal pain.  Bowels moving.  Overall she feels she is doing well.     ROS: See pertinent positives and negatives per HPI.  Past Medical History:  Diagnosis Date  . Heart murmur   . Hyperlipidemia   . Hypertension   . Thyroid disease    goiter (follows with Dr. Ronnald Collum)    Past Surgical History:  Procedure Laterality Date  . ABDOMINAL HYSTERECTOMY  1978   ovaries left in place  . APPENDECTOMY  1978  .  BREAST BIOPSY Right 09/29/2017   Pt had Bx  done in [facility] office [facility] @ 3:00 per chart-"FIBROCYSTIC CHANGES".  . COLONOSCOPY WITH PROPOFOL N/A 07/11/2017   Procedure: COLONOSCOPY WITH PROPOFOL;  Surgeon: Lucilla Lame, MD;  Location: [facility] ENDOSCOPY;  Service: Endoscopy;  Laterality: N/A;  . TUBAL LIGATION      Family History  Problem Relation Age of Onset  . Stroke Mother   . Hypertension Mother   . Arthritis Mother   . Breast cancer Sister 74  . Breast cancer Sister 92  . Lung cancer Brother   . Diabetes Brother     SOCIAL HX: reviewed.    Current Outpatient Medications:  .  atorvastatin (LIPITOR) 40 MG tablet, Take 1 tablet (40 mg total) by mouth daily., Disp: 90 tablet, Rfl: 3 .  diltiazem (TIAZAC) 360 MG 24 hr capsule, Take 1 capsule (360 mg total) by mouth daily., Disp: 90 capsule, Rfl: 3 .  EPINEPHrine (EPIPEN 2-PAK) 0.3 mg/0.3 mL IJ SOAJ injection, USE AS DIRECTED, Disp: 2 Device, Rfl: 0 .  hydrochlorothiazide (HYDRODIURIL) 25 MG tablet, Take 1 tablet (25 mg total) by mouth daily., Disp: 90 tablet, Rfl: 1 .  levothyroxine (SYNTHROID, LEVOTHROID) 25 MCG tablet, Take 25 mcg by mouth daily before breakfast., Disp: , Rfl:  .  losartan (COZAAR) 100 MG tablet, Take 1 tablet (100 mg total) by mouth daily., Disp: 90 tablet, Rfl: 3 .  nystatin cream (MYCOSTATIN), Apply 1 application topically 2 (two) times daily. Apply  to affected area (on vagina) bid (Patient not taking: Reported on 09/30/2019), Disp: 30 g, Rfl: 0 .  Polyvinyl Alcohol-Povidone (REFRESH OP), Apply to eye., Disp: , Rfl:   EXAM:  VITALS per patient if applicable:  017/20  GENERAL: alert.  Answering questions appropriately.  Sounds to be in no acute distress.   PSYCH/NEURO: pleasant and cooperative, no obvious depression or anxiety, speech and thought processing grossly intact  ASSESSMENT AND PLAN:  Discussed the following assessment and plan:  Anemia Follow cbc.   Diabetes mellitus without complication  (HCC) Low carb diet and exercise.  Follow met b and a1c.   Environmental allergies Sees an allergist.  With eye drainage, etc.  Saline nasal spray to help with nasal dryness.  Discussed antihistamine, etc.  If persistent problems, reevaluate.   Essential hypertension, benign HCTZ just added by cardiology.  Continues on losartan and diltiazem.  Follow pressures.  Follow metabolic panel.   Heart murmur Seeing cardiology.   Hypercholesterolemia On lipitor.  Low cholesterol diet and exercise.  Follow lipid panel and liver function tests.     Orders Placed This Encounter  Procedures  . CBC with Differential/Platelet    Standing Status:   Future    Standing Expiration Date:   12/09/2020  . Hepatic function panel    Standing Status:   Future    Standing Expiration Date:   12/09/2020  . Hemoglobin A1c    Standing Status:   Future    Standing Expiration Date:   12/09/2020  . Lipid panel    Standing Status:   Future    Standing Expiration Date:   12/09/2020  . Basic metabolic panel    Standing Status:   Future    Standing Expiration Date:   12/09/2020     I discussed the assessment and treatment plan with the patient. The patient was provided an opportunity to ask questions and all were answered. The patient agreed with the plan and demonstrated an understanding of the instructions.   The patient was advised to call back or seek an in-person evaluation if the symptoms worsen or if the condition fails to improve as anticipated.  I provided 22 minutes of non-face-to-face time during this encounter.   Einar Pheasant, MD

## 2019-12-10 NOTE — Telephone Encounter (Signed)
LMTCB and schedule fasting labs in the next 1-2 weeks and a 69m follow up

## 2019-12-15 ENCOUNTER — Encounter: Payer: Self-pay | Admitting: Internal Medicine

## 2019-12-15 NOTE — Assessment & Plan Note (Signed)
Sees an allergist.  With eye drainage, etc.  Saline nasal spray to help with nasal dryness.  Discussed antihistamine, etc.  If persistent problems, reevaluate.

## 2019-12-15 NOTE — Assessment & Plan Note (Signed)
On lipitor.  Low cholesterol diet and exercise.  Follow lipid panel and liver function tests.   

## 2019-12-15 NOTE — Assessment & Plan Note (Signed)
HCTZ just added by cardiology.  Continues on losartan and diltiazem.  Follow pressures.  Follow metabolic panel.

## 2019-12-15 NOTE — Assessment & Plan Note (Signed)
Low carb diet and exercise.  Follow met b and a1c.  

## 2019-12-15 NOTE — Assessment & Plan Note (Signed)
Follow cbc.  

## 2019-12-15 NOTE — Assessment & Plan Note (Signed)
Seeing cardiology

## 2019-12-25 ENCOUNTER — Other Ambulatory Visit: Payer: Self-pay

## 2019-12-25 ENCOUNTER — Ambulatory Visit (INDEPENDENT_AMBULATORY_CARE_PROVIDER_SITE_OTHER): Payer: Medicare HMO

## 2019-12-25 DIAGNOSIS — R011 Cardiac murmur, unspecified: Secondary | ICD-10-CM

## 2019-12-27 DIAGNOSIS — H02839 Dermatochalasis of unspecified eye, unspecified eyelid: Secondary | ICD-10-CM | POA: Diagnosis not present

## 2019-12-27 DIAGNOSIS — H40003 Preglaucoma, unspecified, bilateral: Secondary | ICD-10-CM | POA: Diagnosis not present

## 2019-12-27 DIAGNOSIS — H18413 Arcus senilis, bilateral: Secondary | ICD-10-CM | POA: Diagnosis not present

## 2019-12-27 DIAGNOSIS — H2513 Age-related nuclear cataract, bilateral: Secondary | ICD-10-CM | POA: Diagnosis not present

## 2020-01-06 DIAGNOSIS — H2513 Age-related nuclear cataract, bilateral: Secondary | ICD-10-CM | POA: Diagnosis not present

## 2020-01-06 DIAGNOSIS — H40013 Open angle with borderline findings, low risk, bilateral: Secondary | ICD-10-CM | POA: Diagnosis not present

## 2020-01-06 DIAGNOSIS — H25013 Cortical age-related cataract, bilateral: Secondary | ICD-10-CM | POA: Diagnosis not present

## 2020-01-06 DIAGNOSIS — H2511 Age-related nuclear cataract, right eye: Secondary | ICD-10-CM | POA: Diagnosis not present

## 2020-01-06 DIAGNOSIS — H25043 Posterior subcapsular polar age-related cataract, bilateral: Secondary | ICD-10-CM | POA: Diagnosis not present

## 2020-01-09 ENCOUNTER — Other Ambulatory Visit: Payer: Self-pay

## 2020-01-09 ENCOUNTER — Encounter: Payer: Self-pay | Admitting: Cardiology

## 2020-01-09 ENCOUNTER — Ambulatory Visit (INDEPENDENT_AMBULATORY_CARE_PROVIDER_SITE_OTHER): Payer: Medicare HMO | Admitting: Cardiology

## 2020-01-09 VITALS — BP 142/84 | HR 63 | Ht 63.0 in | Wt 142.5 lb

## 2020-01-09 DIAGNOSIS — I5189 Other ill-defined heart diseases: Secondary | ICD-10-CM

## 2020-01-09 DIAGNOSIS — I10 Essential (primary) hypertension: Secondary | ICD-10-CM | POA: Diagnosis not present

## 2020-01-09 DIAGNOSIS — R011 Cardiac murmur, unspecified: Secondary | ICD-10-CM

## 2020-01-09 NOTE — Patient Instructions (Signed)
Medication Instructions:  Your physician recommends that you continue on your current medications as directed. Please refer to the Current Medication list given to you today.  *If you need a refill on your cardiac medications before your next appointment, please call your pharmacy*   Lab Work: None ordered If you have labs (blood work) drawn today and your tests are completely normal, you will receive your results only by: . MyChart Message (if you have MyChart) OR . A paper copy in the mail If you have any lab test that is abnormal or we need to change your treatment, we will call you to review the results.   Testing/Procedures: None ordered   Follow-Up: At [facility], you and your health needs are our priority.  As part of our continuing mission to provide you with exceptional heart care, we have created designated Provider Care Teams.  These Care Teams include your primary Cardiologist (physician) and Advanced Practice Providers (APPs -  Physician Assistants and Nurse Practitioners) who all work together to provide you with the care you need, when you need it.  We recommend signing up for the patient portal called "MyChart".  Sign up information is provided on this After Visit Summary.  MyChart is used to connect with patients for Virtual Visits (Telemedicine).  Patients are able to view lab/test results, encounter notes, upcoming appointments, etc.  Non-urgent messages can be sent to your provider as well.   To learn more about what you can do with MyChart, go to [email or web address].    Your next appointment:   6 month(s)  The format for your next appointment:   In Person  Provider:    You may see Brian Agbor-Etang, MD or one of the following Advanced Practice Providers on your designated Care Team:    Christopher Berge, NP  Ryan Dunn, PA-C  Jacquelyn Visser, PA-C    Other Instructions N/A  

## 2020-01-09 NOTE — Progress Notes (Signed)
Cardiology Office Note:    Date:  01/09/2020   ID:  [name], DOB 1941/09/12, MRN [id number]  PCP:  Einar Pheasant, MD  Cardiologist:  Kate Sable, MD  Electrophysiologist:  None   Referring MD: Einar Pheasant, MD   Chief Complaint  Patient presents with  . office visit    F/U after echo; Meds verbally reviewed with patient.    History of Present Illness:    Christina Cole is a 79 y.o. female with a hx of hypertension, hyperlipidemia, cardiac murmur who presents for follow-up.  She was last seen due to a murmur.  Patient states being told having cardiac murmur for a while now.  She denies any history of heart disease.  She denies chest pain or shortness of breath at rest or with exertion.  Denies edema, orthopnea, palpitations.  Cardiac exam after last visit showed a 2/6 systolic murmur.  Echocardiogram was ordered.  She now presents for results.  She is doing okay, tolerating hydrochlorothiazide which was started after last visit.  Past Medical History:  Diagnosis Date  . Heart murmur   . Hyperlipidemia   . Hypertension   . Thyroid disease    goiter (follows with Dr. Ronnald Collum)    Past Surgical History:  Procedure Laterality Date  . ABDOMINAL HYSTERECTOMY  1978   ovaries left in place  . APPENDECTOMY  1978  . BREAST BIOPSY Right 09/29/2017   Pt had Bx  done in [facility] office [facility] @ 3:00 per chart-"FIBROCYSTIC CHANGES".  . COLONOSCOPY WITH PROPOFOL N/A 07/11/2017   Procedure: COLONOSCOPY WITH PROPOFOL;  Surgeon: Lucilla Lame, MD;  Location: [facility] ENDOSCOPY;  Service: Endoscopy;  Laterality: N/A;  . TUBAL LIGATION      Current Medications: Current Meds  Medication Sig  . atorvastatin (LIPITOR) 40 MG tablet Take 1 tablet (40 mg total) by mouth daily.  Marland Kitchen diltiazem (TIAZAC) 360 MG 24 hr capsule Take 1 capsule (360 mg total) by mouth daily.  Marland Kitchen EPINEPHrine (EPIPEN 2-PAK) 0.3 mg/0.3 mL IJ SOAJ injection USE AS DIRECTED  . levothyroxine (SYNTHROID, LEVOTHROID)  25 MCG tablet Take 25 mcg by mouth daily before breakfast.  . losartan (COZAAR) 100 MG tablet Take 1 tablet (100 mg total) by mouth daily.  . Polyvinyl Alcohol-Povidone (REFRESH OP) Apply to eye as needed.      Allergies:   Sulfa antibiotics   Social History   Socioeconomic History  . Marital status: Single    Spouse name: Not on file  . Number of children: 3  . Years of education: Not on file  . Highest education level: Not on file  Occupational History  . Not on file  Tobacco Use  . Smoking status: Former Research scientist (life sciences)  . Smokeless tobacco: Never Used  Substance and Sexual Activity  . Alcohol use: No    Alcohol/week: 0.0 standard drinks  . Drug use: No  . Sexual activity: Not on file  Other Topics Concern  . Not on file  Social History Narrative  . Not on file   Social Determinants of Health   Financial Resource Strain:   . Difficulty of Paying Living Expenses:   Food Insecurity:   . Worried About Charity fundraiser in the Last Year:   . Arboriculturist in the Last Year:   Transportation Needs:   . Film/video editor (Medical):   Marland Kitchen Lack of Transportation (Non-Medical):   Physical Activity:   . Days of Exercise per Week:   . Minutes of  Exercise per Session:   Stress:   . Feeling of Stress :   Social Connections:   . Frequency of Communication with Friends and Family:   . Frequency of Social Gatherings with Friends and Family:   . Attends Religious Services:   . Active Member of Clubs or Organizations:   . Attends Archivist Meetings:   Marland Kitchen Marital Status:      Family History: The patient's family history includes Arthritis in her mother; Breast cancer (age of onset: 63) in her sister; Breast cancer (age of onset: 26) in her sister; Diabetes in her brother; Hypertension in her mother; Lung cancer in her brother; Stroke in her mother.  ROS:   Please see the history of present illness.     All other systems reviewed and are negative.  EKGs/Labs/Other  Studies Reviewed:    The following studies were reviewed today:   EKG:  EKG is  ordered today.  The ekg ordered today demonstrates normal sinus rhythm, first-degree AV block.  Otherwise normal EKG  Recent Labs: 03/08/2019: TSH 0.95 08/06/2019: ALT 11; BUN 11; Creatinine, Ser 0.85; Potassium 4.0; Sodium 138  Recent Lipid Panel    Component Value Date/Time   CHOL 176 08/06/2019 0819   TRIG 125.0 08/06/2019 0819   HDL 54.10 08/06/2019 0819   CHOLHDL 3 08/06/2019 0819   VLDL 25.0 08/06/2019 0819   LDLCALC 97 08/06/2019 0819    Physical Exam:    VS:  BP (!) 142/84 (BP Location: Left Arm, Patient Position: Sitting, Cuff Size: Normal)   Pulse 63   Ht 5\' 3"  (1.6 m)   Wt 142 lb 8 oz (64.6 kg)   LMP 12/21/1976   SpO2 97%   BMI 25.24 kg/m     Wt Readings from Last 3 Encounters:  01/09/20 142 lb 8 oz (64.6 kg)  12/10/19 143 lb (64.9 kg)  09/30/19 143 lb 4 oz (65 kg)     GEN:  Well nourished, well developed in no acute distress HEENT: Normal NECK: No JVD; No carotid bruits LYMPHATICS: No lymphadenopathy CARDIAC: RRR, 2/6 systolic murmur right upper sternal border.  No, rubs, no gallops RESPIRATORY:  Clear to auscultation without rales, wheezing or rhonchi  ABDOMEN: Soft, non-tender, non-distended MUSCULOSKELETAL:  No edema; No deformity  SKIN: Warm and dry NEUROLOGIC:  Alert and oriented x 3 PSYCHIATRIC:  Normal affect   ASSESSMENT:    1. Cardiac murmur   2. Essential hypertension   3. Diastolic dysfunction    PLAN:    In order of problems listed above:  1. Systolic murmur noted on cardiac exam.  Echocardiogram shows normal systolic function with EF 60 to 123456, grade 2 diastolic dysfunction, aortic valve sclerosis.  The aortic valve sclerosis is a likely cause of patient's systolic murmur.   2. Grade 2 diastolic dysfunction/pseudonormalization noted on echocardiogram.  This is likely secondary to hypertension.  Patient is euvolemic.  Continue hypertension  management. 3. History of hypertension, blood pressure is improved.  Continue HCTZ 25 mg daily.  Continue losartan 100 mg daily, continue diltiazem 360 daily.  Low-salt diet advised  Follow-up in 6 months for blood pressure.   Medication Adjustments/Labs and Tests Ordered: Current medicines are reviewed at length with the patient today.  Concerns regarding medicines are outlined above.  Orders Placed This Encounter  Procedures  . EKG 12-Lead   No orders of the defined types were placed in this encounter.   Patient Instructions  Medication Instructions:  Your physician recommends that you  continue on your current medications as directed. Please refer to the Current Medication list given to you today.  *If you need a refill on your cardiac medications before your next appointment, please call your pharmacy*   Lab Work: None ordered If you have labs (blood work) drawn today and your tests are completely normal, you will receive your results only by: Marland Kitchen MyChart Message (if you have MyChart) OR . A paper copy in the mail If you have any lab test that is abnormal or we need to change your treatment, we will call you to review the results.   Testing/Procedures: None ordered   Follow-Up: At [facility], you and your health needs are our priority.  As part of our continuing mission to provide you with exceptional heart care, we have created designated Provider Care Teams.  These Care Teams include your primary Cardiologist (physician) and Advanced Practice Providers (APPs -  Physician Assistants and Nurse Practitioners) who all work together to provide you with the care you need, when you need it.  We recommend signing up for the patient portal called "MyChart".  Sign up information is provided on this After Visit Summary.  MyChart is used to connect with patients for Virtual Visits (Telemedicine).  Patients are able to view lab/test results, encounter notes, upcoming appointments, etc.   Non-urgent messages can be sent to your provider as well.   To learn more about what you can do with MyChart, go to [email or web address].    Your next appointment:   6 month(s)  The format for your next appointment:   In Person  Provider:    You may see Kate Sable, MD or one of the following Advanced Practice Providers on your designated Care Team:    Murray Hodgkins, NP  Christell Faith, PA-C  Marrianne Mood, PA-C    Other Instructions N/A     Signed, Kate Sable, MD  01/09/2020 11:17 AM    [place]

## 2020-02-12 DIAGNOSIS — H2511 Age-related nuclear cataract, right eye: Secondary | ICD-10-CM | POA: Diagnosis not present

## 2020-02-13 DIAGNOSIS — H25012 Cortical age-related cataract, left eye: Secondary | ICD-10-CM | POA: Diagnosis not present

## 2020-02-13 DIAGNOSIS — H25042 Posterior subcapsular polar age-related cataract, left eye: Secondary | ICD-10-CM | POA: Diagnosis not present

## 2020-02-13 DIAGNOSIS — H2512 Age-related nuclear cataract, left eye: Secondary | ICD-10-CM | POA: Diagnosis not present

## 2020-02-26 DIAGNOSIS — H2512 Age-related nuclear cataract, left eye: Secondary | ICD-10-CM | POA: Diagnosis not present

## 2020-03-09 DIAGNOSIS — E785 Hyperlipidemia, unspecified: Secondary | ICD-10-CM | POA: Diagnosis not present

## 2020-03-09 DIAGNOSIS — E039 Hypothyroidism, unspecified: Secondary | ICD-10-CM | POA: Diagnosis not present

## 2020-03-09 DIAGNOSIS — E049 Nontoxic goiter, unspecified: Secondary | ICD-10-CM | POA: Diagnosis not present

## 2020-03-09 DIAGNOSIS — I10 Essential (primary) hypertension: Secondary | ICD-10-CM | POA: Diagnosis not present

## 2020-03-09 DIAGNOSIS — E041 Nontoxic single thyroid nodule: Secondary | ICD-10-CM | POA: Diagnosis not present

## 2020-03-17 ENCOUNTER — Other Ambulatory Visit: Payer: Self-pay

## 2020-03-17 MED ORDER — HYDROCHLOROTHIAZIDE 25 MG PO TABS: 25.0000 mg | ORAL_TABLET | Freq: Every day | ORAL | 3 refills | Status: DC

## 2020-03-20 DIAGNOSIS — E785 Hyperlipidemia, unspecified: Secondary | ICD-10-CM | POA: Diagnosis not present

## 2020-03-20 DIAGNOSIS — E049 Nontoxic goiter, unspecified: Secondary | ICD-10-CM | POA: Diagnosis not present

## 2020-03-20 DIAGNOSIS — E039 Hypothyroidism, unspecified: Secondary | ICD-10-CM | POA: Diagnosis not present

## 2020-03-20 DIAGNOSIS — I10 Essential (primary) hypertension: Secondary | ICD-10-CM | POA: Diagnosis not present

## 2020-03-20 DIAGNOSIS — E041 Nontoxic single thyroid nodule: Secondary | ICD-10-CM | POA: Diagnosis not present

## 2020-03-27 ENCOUNTER — Other Ambulatory Visit (INDEPENDENT_AMBULATORY_CARE_PROVIDER_SITE_OTHER): Payer: Medicare HMO

## 2020-03-27 ENCOUNTER — Other Ambulatory Visit: Payer: Self-pay

## 2020-03-27 DIAGNOSIS — E119 Type 2 diabetes mellitus without complications: Secondary | ICD-10-CM

## 2020-03-27 DIAGNOSIS — I10 Essential (primary) hypertension: Secondary | ICD-10-CM | POA: Diagnosis not present

## 2020-03-27 DIAGNOSIS — E7800 Pure hypercholesterolemia, unspecified: Secondary | ICD-10-CM

## 2020-03-27 LAB — CBC WITH DIFFERENTIAL/PLATELET
Basophils Absolute: 0 K/uL (ref 0.0–0.1)
Basophils Relative: 0.8 % (ref 0.0–3.0)
Eosinophils Absolute: 0.2 K/uL (ref 0.0–0.7)
Eosinophils Relative: 3.4 % (ref 0.0–5.0)
HCT: 36.2 % (ref 36.0–46.0)
Hemoglobin: 12.1 g/dL (ref 12.0–15.0)
Lymphocytes Relative: 44.6 % (ref 12.0–46.0)
Lymphs Abs: 2.4 K/uL (ref 0.7–4.0)
MCHC: 33.5 g/dL (ref 30.0–36.0)
MCV: 88.1 fl (ref 78.0–100.0)
Monocytes Absolute: 0.4 K/uL (ref 0.1–1.0)
Monocytes Relative: 7.1 % (ref 3.0–12.0)
Neutro Abs: 2.4 K/uL (ref 1.4–7.7)
Neutrophils Relative %: 44.1 % (ref 43.0–77.0)
Platelets: 198 K/uL (ref 150.0–400.0)
RBC: 4.11 Mil/uL (ref 3.87–5.11)
RDW: 13.9 % (ref 11.5–15.5)
WBC: 5.5 K/uL (ref 4.0–10.5)

## 2020-03-27 LAB — BASIC METABOLIC PANEL
BUN: 11 mg/dL (ref 6–23)
CO2: 28 mEq/L (ref 19–32)
Calcium: 9.3 mg/dL (ref 8.4–10.5)
Chloride: 101 mEq/L (ref 96–112)
Creatinine, Ser: 0.94 mg/dL (ref 0.40–1.20)
GFR: 69.89 mL/min (ref >60.00)
Glucose, Bld: 100 mg/dL — ABNORMAL HIGH (ref 70–99)
Potassium: 3.3 mEq/L — ABNORMAL LOW (ref 3.5–5.1)
Sodium: 138 mEq/L (ref 135–145)

## 2020-03-27 LAB — HEPATIC FUNCTION PANEL
ALT: 14 U/L (ref 0–35)
AST: 16 U/L (ref 0–37)
Albumin: 4.3 g/dL (ref 3.5–5.2)
Alkaline Phosphatase: 58 U/L (ref 39–117)
Bilirubin, Direct: 0.1 mg/dL (ref 0.0–0.3)
Total Bilirubin: 0.7 mg/dL (ref 0.2–1.2)
Total Protein: 7.4 g/dL (ref 6.0–8.3)

## 2020-03-27 LAB — HEMOGLOBIN A1C: Hgb A1c MFr Bld: 6.4 % (ref 4.6–6.5)

## 2020-03-27 LAB — LIPID PANEL
Cholesterol: 166 mg/dL (ref 0–200)
HDL: 50.1 mg/dL (ref >39.00)
LDL Cholesterol: 90 mg/dL (ref 0–99)
NonHDL: 115.9
Total CHOL/HDL Ratio: 3
Triglycerides: 130 mg/dL (ref 0.0–149.0)
VLDL: 26 mg/dL (ref 0.0–40.0)

## 2020-03-31 DIAGNOSIS — T1510XA Foreign body in conjunctival sac, unspecified eye, initial encounter: Secondary | ICD-10-CM | POA: Diagnosis not present

## 2020-04-01 ENCOUNTER — Ambulatory Visit (INDEPENDENT_AMBULATORY_CARE_PROVIDER_SITE_OTHER): Payer: Medicare HMO | Admitting: Internal Medicine

## 2020-04-01 ENCOUNTER — Other Ambulatory Visit: Payer: Self-pay

## 2020-04-01 DIAGNOSIS — E7800 Pure hypercholesterolemia, unspecified: Secondary | ICD-10-CM

## 2020-04-01 DIAGNOSIS — R011 Cardiac murmur, unspecified: Secondary | ICD-10-CM | POA: Diagnosis not present

## 2020-04-01 DIAGNOSIS — E119 Type 2 diabetes mellitus without complications: Secondary | ICD-10-CM | POA: Diagnosis not present

## 2020-04-01 DIAGNOSIS — I10 Essential (primary) hypertension: Secondary | ICD-10-CM

## 2020-04-01 DIAGNOSIS — D649 Anemia, unspecified: Secondary | ICD-10-CM | POA: Diagnosis not present

## 2020-04-01 NOTE — Progress Notes (Signed)
Patient ID: Dashae Wilcher, female   DOB: 06-Aug-1941, 79 y.o.   MRN: [id number]   Subjective:    Patient ID: [name], female    DOB: 15-Jul-1941, 79 y.o.   MRN: [id number]  HPI This visit occurred during the SARS-CoV-2 public health emergency.  Safety protocols were in place, including screening questions prior to the visit, additional usage of staff PPE, and extensive cleaning of exam room while observing appropriate contact time as indicated for disinfecting solutions.  Patient here for a scheduled follow up.  Here to follow up regarding her cholesterol and her blood pressure.  She is seeing ophthalmology for her eye.  Using steroid drops.  Tries to stay active.  No chest pain or sob.  No acid reflux.  No abdominal pain or cramping.  Bowels stable.  Blood pressures averaging 121-146/70-80.  Saw cardiology in follow up 01/09/20.  Follow murmur - ECHO - normal EF 86-76%, grade 2 diastolic dysfunction, aortic valve sclerosis.  Overall she feels she is doing well.    Past Medical History:  Diagnosis Date  . Heart murmur   . Hyperlipidemia   . Hypertension   . Thyroid disease    goiter (follows with Dr. Ronnald Collum)   Past Surgical History:  Procedure Laterality Date  . ABDOMINAL HYSTERECTOMY  1978   ovaries left in place  . APPENDECTOMY  1978  . BREAST BIOPSY Right 09/29/2017   Pt had Bx  done in [facility]'s office [place] @ 3:00 per chart-"FIBROCYSTIC CHANGES".  . COLONOSCOPY WITH PROPOFOL N/A 07/11/2017   Procedure: COLONOSCOPY WITH PROPOFOL;  Surgeon: Lucilla Lame, MD;  Location: [facility] ENDOSCOPY;  Service: Endoscopy;  Laterality: N/A;  . TUBAL LIGATION     Family History  Problem Relation Age of Onset  . Stroke Mother   . Hypertension Mother   . Arthritis Mother   . Breast cancer Sister 62  . Breast cancer Sister 68  . Lung cancer Brother   . Diabetes Brother    Social History   Socioeconomic History  . Marital status: Single    Spouse name: Not on file  . Number of  children: 3  . Years of education: Not on file  . Highest education level: Not on file  Occupational History  . Not on file  Tobacco Use  . Smoking status: Former Research scientist (life sciences)  . Smokeless tobacco: Never Used  Substance and Sexual Activity  . Alcohol use: No    Alcohol/week: 0.0 standard drinks  . Drug use: No  . Sexual activity: Not on file  Other Topics Concern  . Not on file  Social History Narrative  . Not on file   Social Determinants of Health   Financial Resource Strain:   . Difficulty of Paying Living Expenses:   Food Insecurity:   . Worried About Charity fundraiser in the Last Year:   . Arboriculturist in the Last Year:   Transportation Needs:   . Film/video editor (Medical):   Marland Kitchen Lack of Transportation (Non-Medical):   Physical Activity:   . Days of Exercise per Week:   . Minutes of Exercise per Session:   Stress:   . Feeling of Stress :   Social Connections:   . Frequency of Communication with Friends and Family:   . Frequency of Social Gatherings with Friends and Family:   . Attends Religious Services:   . Active Member of Clubs or Organizations:   . Attends Archivist Meetings:   .  Marital Status:     Outpatient Encounter Medications as of 04/01/2020  Medication Sig  . atorvastatin (LIPITOR) 40 MG tablet Take 1 tablet (40 mg total) by mouth daily.  Marland Kitchen diltiazem (TIAZAC) 360 MG 24 hr capsule Take 1 capsule (360 mg total) by mouth daily.  Marland Kitchen EPINEPHrine (EPIPEN 2-PAK) 0.3 mg/0.3 mL IJ SOAJ injection USE AS DIRECTED  . hydrochlorothiazide (HYDRODIURIL) 25 MG tablet Take 1 tablet (25 mg total) by mouth daily.  Marland Kitchen levothyroxine (SYNTHROID, LEVOTHROID) 25 MCG tablet Take 25 mcg by mouth daily before breakfast.  . losartan (COZAAR) 100 MG tablet Take 1 tablet (100 mg total) by mouth daily.  . Polyvinyl Alcohol-Povidone (REFRESH OP) Apply to eye as needed.   . [DISCONTINUED] nystatin cream (MYCOSTATIN) Apply 1 application topically 2 (two) times daily.  Apply to affected area (on vagina) bid (Patient not taking: Reported on 09/30/2019)   No facility-administered encounter medications on file as of 04/01/2020.    Review of Systems  Constitutional: Negative for appetite change and unexpected weight change.  HENT: Negative for congestion and sinus pressure.   Eyes:       Eye irritation as outlined.   Respiratory: Negative for cough, chest tightness and shortness of breath.   Cardiovascular: Negative for chest pain, palpitations and leg swelling.  Gastrointestinal: Negative for abdominal pain, diarrhea, nausea and vomiting.  Genitourinary: Negative for difficulty urinating and dysuria.  Musculoskeletal: Negative for joint swelling and myalgias.  Skin: Negative for color change and rash.  Neurological: Negative for dizziness, light-headedness and headaches.  Psychiatric/Behavioral: Negative for agitation and dysphoric mood.       Objective:    Physical Exam Vitals reviewed.  Constitutional:      General: She is not in acute distress.    Appearance: Normal appearance.  HENT:     Head: Normocephalic and atraumatic.     Right Ear: External ear normal.     Left Ear: External ear normal.  Eyes:     General: No scleral icterus. Neck:     Thyroid: No thyromegaly.  Cardiovascular:     Rate and Rhythm: Normal rate and regular rhythm.  Pulmonary:     Effort: No respiratory distress.     Breath sounds: Normal breath sounds. No wheezing.  Abdominal:     General: Bowel sounds are normal.     Palpations: Abdomen is soft.     Tenderness: There is no abdominal tenderness.  Musculoskeletal:        General: No swelling or tenderness.     Cervical back: Neck supple. No tenderness.  Lymphadenopathy:     Cervical: No cervical adenopathy.  Skin:    Findings: No erythema or rash.  Neurological:     Mental Status: She is alert.  Psychiatric:        Mood and Affect: Mood normal.        Behavior: Behavior normal.     BP 128/72   Pulse 76    Temp (!) 97.2 F (36.2 C)   Resp 16   Ht _0  (1.6 m)   Wt 143 lb 6.4 oz (65 kg)   LMP 12/21/1976   SpO2 98%   BMI 25.40 kg/m  Wt Readings from Last 3 Encounters:  04/01/20 143 lb 6.4 oz (65 kg)  01/09/20 142 lb 8 oz (64.6 kg)  12/10/19 143 lb (64.9 kg)     Lab Results  Component Value Date   WBC 5.5 03/27/2020   HGB 12.1 03/27/2020   HCT 36.2  03/27/2020   PLT 198.0 03/27/2020   GLUCOSE 100 (H) 03/27/2020   CHOL 166 03/27/2020   TRIG 130.0 03/27/2020   HDL 50.10 03/27/2020   LDLCALC 90 03/27/2020   ALT 14 03/27/2020   AST 16 03/27/2020   NA 138 03/27/2020   K 3.3 (L) 03/27/2020   CL 101 03/27/2020   CREATININE 0.94 03/27/2020   BUN 11 03/27/2020   CO2 28 03/27/2020   TSH 0.95 03/08/2019   HGBA1C 6.4 03/27/2020   MICROALBUR <0.7 08/06/2019    MM 3D SCREEN BREAST BILATERAL  Result Date: 09/10/2019 CLINICAL DATA:  Screening. EXAM: DIGITAL SCREENING BILATERAL MAMMOGRAM WITH TOMO AND CAD COMPARISON:  Previous exam(s). ACR Breast Density Category c: The breast tissue is heterogeneously dense, which may obscure small masses. FINDINGS: There are no findings suspicious for malignancy. Images were processed with CAD. IMPRESSION: No mammographic evidence of malignancy. A result letter of this screening mammogram will be mailed directly to the patient. RECOMMENDATION: Screening mammogram in one year. (Code:SM-B-01Y) BI-RADS CATEGORY  1: Negative. Electronically Signed   By: Nolon Nations M.D.   On: 09/10/2019 11:26       Assessment & Plan:   Problem List Items Addressed This Visit    Anemia    Follow cbc.       Diabetes mellitus without complication (HCC)    Low carb diet and exercise.  Follow met b and a1c.        Relevant Orders   Hemoglobin C9S   Basic metabolic panel   Essential hypertension, benign    Blood pressure on recheck today as outlined.  Her checks reviewed.  Continue hctz, losartan and diltiazem.  Follow pressures.  Follow metabolic panel.  Hold  on making changes in medication.       Relevant Orders   TSH   Heart murmur    Saw cardiology.  ECHO as outlined.  Aortic sclerosis.        Hypercholesterolemia    On lipitor.  Low cholesterol diet and exercise.  Follow lipid panel and liver function tests.   Lab Results  Component Value Date   CHOL 166 03/27/2020   HDL 50.10 03/27/2020   LDLCALC 90 03/27/2020   TRIG 130.0 03/27/2020   CHOLHDL 3 03/27/2020        Relevant Orders   Lipid panel   Hepatic function panel       Einar Pheasant, MD

## 2020-04-05 ENCOUNTER — Encounter: Payer: Self-pay | Admitting: Internal Medicine

## 2020-04-05 NOTE — Assessment & Plan Note (Signed)
Blood pressure on recheck today as outlined.  Her checks reviewed.  Continue hctz, losartan and diltiazem.  Follow pressures.  Follow metabolic panel.  Hold on making changes in medication.

## 2020-04-05 NOTE — Assessment & Plan Note (Signed)
Saw cardiology.  ECHO as outlined.  Aortic sclerosis.

## 2020-04-05 NOTE — Assessment & Plan Note (Signed)
On lipitor.  Low cholesterol diet and exercise.  Follow lipid panel and liver function tests.   Lab Results  Component Value Date   CHOL 166 03/27/2020   HDL 50.10 03/27/2020   LDLCALC 90 03/27/2020   TRIG 130.0 03/27/2020   CHOLHDL 3 03/27/2020

## 2020-04-05 NOTE — Assessment & Plan Note (Signed)
Low carb diet and exercise.  Follow met b and a1c.   

## 2020-04-05 NOTE — Assessment & Plan Note (Signed)
Follow cbc.  

## 2020-05-18 DIAGNOSIS — H18232 Secondary corneal edema, left eye: Secondary | ICD-10-CM | POA: Diagnosis not present

## 2020-06-01 DIAGNOSIS — H18232 Secondary corneal edema, left eye: Secondary | ICD-10-CM | POA: Diagnosis not present

## 2020-07-07 DIAGNOSIS — H16223 Keratoconjunctivitis sicca, not specified as Sjogren's, bilateral: Secondary | ICD-10-CM | POA: Diagnosis not present

## 2020-07-13 ENCOUNTER — Ambulatory Visit: Payer: Medicare HMO | Admitting: Cardiology

## 2020-07-13 ENCOUNTER — Other Ambulatory Visit: Payer: Self-pay

## 2020-07-13 ENCOUNTER — Encounter: Payer: Self-pay | Admitting: Cardiology

## 2020-07-13 VITALS — BP 140/70 | HR 56 | Ht 63.0 in | Wt 141.2 lb

## 2020-07-13 DIAGNOSIS — E7800 Pure hypercholesterolemia, unspecified: Secondary | ICD-10-CM

## 2020-07-13 DIAGNOSIS — I10 Essential (primary) hypertension: Secondary | ICD-10-CM

## 2020-07-13 DIAGNOSIS — I5189 Other ill-defined heart diseases: Secondary | ICD-10-CM

## 2020-07-13 NOTE — Patient Instructions (Signed)

## 2020-07-13 NOTE — Progress Notes (Signed)
Cardiology Office Note:    Date:  07/13/2020   ID:  Christina Cole, DOB [birth year], MRN [id number]  PCP:  Einar Pheasant, MD  Cardiologist:  Kate Sable, MD  Electrophysiologist:  None   Referring MD: Einar Pheasant, MD   Chief Complaint  Patient presents with  . OTHER    6 month f/u no complaints today. Meds reviewed verbally with pt.    History of Present Illness:    Christina Cole is a 79 y.o. 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  She is being seen for hypertension.  Taking and tolerating her BP meds.  Has no side effects.  Denies edema, chest pain or shortness of breath, able to work on a yard without any problems.  Has no complaints today.  Prior notes Echo on 12/2019 showed normal systolic function, EF 60 to 05%, grade 2 diastolic dysfunction, aortic valve sclerosis.  Past Medical History:  Diagnosis Date  . Heart murmur   . Hyperlipidemia   . Hypertension   . Thyroid disease    goiter (follows with Dr. Ronnald Collum)    Past Surgical History:  Procedure Laterality Date  . ABDOMINAL HYSTERECTOMY  1978   ovaries left in place  . APPENDECTOMY  1978  . BREAST BIOPSY Right 09/29/2017   Pt had Bx  done in [facility] office [place] @ 3:00 per chart-"FIBROCYSTIC CHANGES".  . COLONOSCOPY WITH PROPOFOL N/A 07/11/2017   Procedure: COLONOSCOPY WITH PROPOFOL;  Surgeon: Lucilla Lame, MD;  Location: [facility] ENDOSCOPY;  Service: Endoscopy;  Laterality: N/A;  . TUBAL LIGATION      Current Medications: Current Meds  Medication Sig  . atorvastatin (LIPITOR) 40 MG tablet Take 1 tablet (40 mg total) by mouth daily.  Marland Kitchen diltiazem (TIAZAC) 360 MG 24 hr capsule Take 1 capsule (360 mg total) by mouth daily.  Marland Kitchen EPINEPHrine (EPIPEN 2-PAK) 0.3 mg/0.3 mL IJ SOAJ injection USE AS DIRECTED  . hydrochlorothiazide (HYDRODIURIL) 25 MG tablet Take 1 tablet (25 mg total) by mouth daily.  Marland Kitchen  levothyroxine (SYNTHROID, LEVOTHROID) 25 MCG tablet Take 25 mcg by mouth daily before breakfast.  . losartan (COZAAR) 100 MG tablet Take 1 tablet (100 mg total) by mouth daily.  . Polyvinyl Alcohol-Povidone (REFRESH OP) Apply to eye as needed.      Allergies:   Sulfa antibiotics   Social History   Socioeconomic History  . Marital status: Single    Spouse name: Not on file  . Number of children: 3  . Years of education: Not on file  . Highest education level: Not on file  Occupational History  . Not on file  Tobacco Use  . Smoking status: Former Research scientist (life sciences)  . Smokeless tobacco: Never Used  Vaping Use  . Vaping Use: Never used  Substance and Sexual Activity  . Alcohol use: No    Alcohol/week: 0.0 standard drinks  . Drug use: No  . Sexual activity: Not on file  Other Topics Concern  . Not on file  Social History Narrative  . Not on file   Social Determinants of Health   Financial Resource Strain:   . Difficulty of Paying Living Expenses: Not on file  Food Insecurity:   . Worried About Charity fundraiser in the Last Year: Not on file  . Ran Out of Food in the Last Year: Not on file  Transportation Needs:   . Lack of Transportation (Medical): Not on file  .  Lack of Transportation (Non-Medical): Not on file  Physical Activity:   . Days of Exercise per Week: Not on file  . Minutes of Exercise per Session: Not on file  Stress:   . Feeling of Stress : Not on file  Social Connections:   . Frequency of Communication with Friends and Family: Not on file  . Frequency of Social Gatherings with Friends and Family: Not on file  . Attends Religious Services: Not on file  . Active Member of Clubs or Organizations: Not on file  . Attends Archivist Meetings: Not on file  . Marital Status: Not on file     Family History: The patient's family history includes Arthritis in her mother; Breast cancer (age of onset: 69) in her sister; Breast cancer (age of onset: 7) in her  sister; Diabetes in her brother; Hypertension in her mother; Lung cancer in her brother; Stroke in her mother.  ROS:   Please see the history of present illness.     All other systems reviewed and are negative.  EKGs/Labs/Other Studies Reviewed:    The following studies were reviewed today:   EKG:  EKG is  ordered today.  The ekg ordered today demonstrates sinus bradycardia, otherwise normal ECG.  Recent Labs: 03/27/2020: ALT 14; BUN 11; Creatinine, Ser 0.94; Hemoglobin 12.1; Platelets 198.0; Potassium 3.3; Sodium 138  Recent Lipid Panel    Component Value Date/Time   CHOL 166 03/27/2020 0801   TRIG 130.0 03/27/2020 0801   HDL 50.10 03/27/2020 0801   CHOLHDL 3 03/27/2020 0801   VLDL 26.0 03/27/2020 0801   LDLCALC 90 03/27/2020 0801    Physical Exam:    VS:  BP 140/70 (BP Location: Left Arm, Patient Position: Sitting, Cuff Size: Normal)   Pulse (!) 56   Ht 5\' 3"  (1.6 m)   Wt 141 lb 4 oz (64.1 kg)   LMP 12/21/1976   SpO2 98%   BMI 25.02 kg/m     Wt Readings from Last 3 Encounters:  07/13/20 141 lb 4 oz (64.1 kg)  04/01/20 143 lb 6.4 oz (65 kg)  01/09/20 142 lb 8 oz (64.6 kg)     GEN:  Well nourished, well developed in no acute distress HEENT: Normal NECK: No JVD; No carotid bruits LYMPHATICS: No lymphadenopathy CARDIAC: RRR, 2/6 systolic murmur right upper sternal border.  No, rubs, no gallops RESPIRATORY:  Clear to auscultation without rales, wheezing or rhonchi  ABDOMEN: Soft, non-tender, non-distended MUSCULOSKELETAL:  No edema; No deformity  SKIN: Warm and dry NEUROLOGIC:  Alert and oriented x 3 PSYCHIATRIC:  Normal affect   ASSESSMENT:    1. Essential hypertension   2. Diastolic dysfunction   3. Pure hypercholesterolemia    PLAN:    In order of problems listed above:    1. Patient with history of hypertension, BP reasonably controlled.  Continue current BP meds, losartan, HCTZ, diltiazem.  Patient checks BP at home and is usually in the 130s.  If BP  starts to run low, or patient becomes dizzy, we can titrate down current medications starting with diltiazem.  Otherwise continue for now. 2. History of grade 2 diastolic dysfunction likely secondary to hypertension.  Patient is euvolemic.  Continue management of hypertension as above. 3. History of hyperlipidemia, last LDL and total cholesterol controlled.  Continue Lipitor.  Follow-up in 1 year.  Total encounter time 35 minutes  Greater than 50% was spent in counseling and coordination of care with the patient    Medication  Adjustments/Labs and Tests Ordered: Current medicines are reviewed at length with the patient today.  Concerns regarding medicines are outlined above.  Orders Placed This Encounter  Procedures  . EKG 12-Lead   No orders of the defined types were placed in this encounter.   Patient Instructions  Medication Instructions:  Your physician recommends that you continue on your current medications as directed. Please refer to the Current Medication list given to you today.  *If you need a refill on your cardiac medications before your next appointment, please call your pharmacy*   Lab Work: None Ordered If you have labs (blood work) drawn today and your tests are completely normal, you will receive your results only by: Marland Kitchen MyChart Message (if you have MyChart) OR . A paper copy in the mail If you have any lab test that is abnormal or we need to change your treatment, we will call you to review the results.   Testing/Procedures: None Ordered   Follow-Up: At [facility], you and your health needs are our priority.  As part of our continuing mission to provide you with exceptional heart care, we have created designated Provider Care Teams.  These Care Teams include your primary Cardiologist (physician) and Advanced Practice Providers (APPs -  Physician Assistants and Nurse Practitioners) who all work together to provide you with the care you need, when you need  it.  We recommend signing up for the patient portal called "MyChart".  Sign up information is provided on this After Visit Summary.  MyChart is used to connect with patients for Virtual Visits (Telemedicine).  Patients are able to view lab/test results, encounter notes, upcoming appointments, etc.  Non-urgent messages can be sent to your provider as well.   To learn more about what you can do with MyChart, go to [email or web address].    Your next appointment:   1 year(s)  The format for your next appointment:   In Person  Provider:   Kate Sable, MD   Other Instructions      Signed, Kate Sable, MD  07/13/2020 12:40 PM    [place]

## 2020-08-03 ENCOUNTER — Other Ambulatory Visit: Payer: Self-pay | Admitting: Internal Medicine

## 2020-08-03 DIAGNOSIS — Z1231 Encounter for screening mammogram for malignant neoplasm of breast: Secondary | ICD-10-CM

## 2020-08-10 ENCOUNTER — Other Ambulatory Visit (INDEPENDENT_AMBULATORY_CARE_PROVIDER_SITE_OTHER): Payer: Medicare HMO

## 2020-08-10 ENCOUNTER — Other Ambulatory Visit: Payer: Self-pay

## 2020-08-10 DIAGNOSIS — E7800 Pure hypercholesterolemia, unspecified: Secondary | ICD-10-CM | POA: Diagnosis not present

## 2020-08-10 DIAGNOSIS — E119 Type 2 diabetes mellitus without complications: Secondary | ICD-10-CM

## 2020-08-10 DIAGNOSIS — I10 Essential (primary) hypertension: Secondary | ICD-10-CM

## 2020-08-10 LAB — HEMOGLOBIN A1C: Hgb A1c MFr Bld: 6.6 % — ABNORMAL HIGH (ref 4.6–6.5)

## 2020-08-10 LAB — TSH: TSH: 1.66 u[IU]/mL (ref 0.35–4.50)

## 2020-08-11 LAB — BASIC METABOLIC PANEL
BUN: 15 mg/dL (ref 6–23)
CO2: 27 mEq/L (ref 19–32)
Calcium: 9.4 mg/dL (ref 8.4–10.5)
Chloride: 99 mEq/L (ref 96–112)
Creatinine, Ser: 1.05 mg/dL (ref 0.40–1.20)
GFR: 51.1 mL/min — ABNORMAL LOW (ref >60.00)
Glucose, Bld: 98 mg/dL (ref 70–99)
Potassium: 3 mEq/L — ABNORMAL LOW (ref 3.5–5.1)
Sodium: 137 mEq/L (ref 135–145)

## 2020-08-11 LAB — HEPATIC FUNCTION PANEL
ALT: 9 U/L (ref 0–35)
AST: 12 U/L (ref 0–37)
Albumin: 4.2 g/dL (ref 3.5–5.2)
Alkaline Phosphatase: 48 U/L (ref 39–117)
Bilirubin, Direct: 0.1 mg/dL (ref 0.0–0.3)
Total Bilirubin: 0.8 mg/dL (ref 0.2–1.2)
Total Protein: 7.5 g/dL (ref 6.0–8.3)

## 2020-08-11 LAB — LIPID PANEL
Cholesterol: 156 mg/dL (ref 0–200)
HDL: 51.4 mg/dL (ref >39.00)
LDL Cholesterol: 86 mg/dL (ref 0–99)
NonHDL: 104.42
Total CHOL/HDL Ratio: 3
Triglycerides: 90 mg/dL (ref 0.0–149.0)
VLDL: 18 mg/dL (ref 0.0–40.0)

## 2020-08-12 ENCOUNTER — Encounter: Payer: Self-pay | Admitting: Internal Medicine

## 2020-08-12 ENCOUNTER — Other Ambulatory Visit: Payer: Self-pay

## 2020-08-12 ENCOUNTER — Ambulatory Visit (INDEPENDENT_AMBULATORY_CARE_PROVIDER_SITE_OTHER): Payer: Medicare HMO | Admitting: Internal Medicine

## 2020-08-12 VITALS — BP 130/68 | HR 66 | Temp 98.0°F | Resp 16 | Ht 63.0 in | Wt 138.4 lb

## 2020-08-12 DIAGNOSIS — E7800 Pure hypercholesterolemia, unspecified: Secondary | ICD-10-CM | POA: Diagnosis not present

## 2020-08-12 DIAGNOSIS — I10 Essential (primary) hypertension: Secondary | ICD-10-CM

## 2020-08-12 DIAGNOSIS — E876 Hypokalemia: Secondary | ICD-10-CM | POA: Diagnosis not present

## 2020-08-12 DIAGNOSIS — D649 Anemia, unspecified: Secondary | ICD-10-CM | POA: Diagnosis not present

## 2020-08-12 DIAGNOSIS — E079 Disorder of thyroid, unspecified: Secondary | ICD-10-CM

## 2020-08-12 DIAGNOSIS — Z91030 Bee allergy status: Secondary | ICD-10-CM

## 2020-08-12 DIAGNOSIS — E119 Type 2 diabetes mellitus without complications: Secondary | ICD-10-CM

## 2020-08-12 MED ORDER — POTASSIUM CHLORIDE ER 10 MEQ PO TBCR: 10.0000 meq | EXTENDED_RELEASE_TABLET | Freq: Every day | ORAL | 0 refills | Status: DC

## 2020-08-12 MED ORDER — EPINEPHRINE 0.3 MG/0.3ML IJ SOAJ: INTRAMUSCULAR | 0 refills | Status: DC

## 2020-08-12 NOTE — Progress Notes (Signed)
Patient ID: Christina Cole, female   DOB: [birth year], 79 y.o.   MRN: [id number]   Subjective:    Patient ID: Christina Cole, female    DOB: August 02, 1941, 79 y.o.   MRN: [id number]  HPI This visit occurred during the SARS-CoV-2 public health emergency.  Safety protocols were in place, including screening questions prior to the visit, additional usage of staff PPE, and extensive cleaning of exam room while observing appropriate contact time as indicated for disinfecting solutions.  Patient here for a scheduled follow up.  Here to f/u regarding her blood pressure, sugar and cholesterol.  She reports she is doing well.  Stays active.  No chest pain or sob.  No acid reflux.  No abdominal pain.  Bowels moving.  Discussed labs. Potassium low.  Discussed potassium supplements.  Seeing ophthalmology.  Following her left eye.  No vision change.  Plans to get her flu vaccine at the pharmacy today.    Past Medical History:  Diagnosis Date  . Heart murmur   . Hyperlipidemia   . Hypertension   . Thyroid disease    goiter (follows with Dr. Ronnald Collum)   Past Surgical History:  Procedure Laterality Date  . ABDOMINAL HYSTERECTOMY  1978   ovaries left in place  . APPENDECTOMY  1978  . BREAST BIOPSY Right 09/29/2017   Pt had Bx  done in [facility] office [place] @ 3:00 per chart-"FIBROCYSTIC CHANGES".  . COLONOSCOPY WITH PROPOFOL N/A 07/11/2017   Procedure: COLONOSCOPY WITH PROPOFOL;  Surgeon: Lucilla Lame, MD;  Location: [facility] ENDOSCOPY;  Service: Endoscopy;  Laterality: N/A;  . TUBAL LIGATION     Family History  Problem Relation Age of Onset  . Stroke Mother   . Hypertension Mother   . Arthritis Mother   . Breast cancer Sister 26  . Breast cancer Sister 79  . Lung cancer Brother   . Diabetes Brother    Social History   Socioeconomic History  . Marital status: Single    Spouse name: Not on file  . Number of children: 3  . Years of education: Not on file  . Highest education level: Not on file    Occupational History  . Not on file  Tobacco Use  . Smoking status: Former Research scientist (life sciences)  . Smokeless tobacco: Never Used  Vaping Use  . Vaping Use: Never used  Substance and Sexual Activity  . Alcohol use: No    Alcohol/week: 0.0 standard drinks  . Drug use: No  . Sexual activity: Not on file  Other Topics Concern  . Not on file  Social History Narrative  . Not on file   Social Determinants of Health   Financial Resource Strain:   . Difficulty of Paying Living Expenses: Not on file  Food Insecurity:   . Worried About Charity fundraiser in the Last Year: Not on file  . Ran Out of Food in the Last Year: Not on file  Transportation Needs:   . Lack of Transportation (Medical): Not on file  . Lack of Transportation (Non-Medical): Not on file  Physical Activity:   . Days of Exercise per Week: Not on file  . Minutes of Exercise per Session: Not on file  Stress:   . Feeling of Stress : Not on file  Social Connections:   . Frequency of Communication with Friends and Family: Not on file  . Frequency of Social Gatherings with Friends and Family: Not on file  . Attends Religious Services: Not on file  .  Active Member of Clubs or Organizations: Not on file  . Attends Archivist Meetings: Not on file  . Marital Status: Not on file    Outpatient Encounter Medications as of 08/12/2020  Medication Sig  . atorvastatin (LIPITOR) 40 MG tablet Take 1 tablet (40 mg total) by mouth daily.  Marland Kitchen diltiazem (TIAZAC) 360 MG 24 hr capsule Take 1 capsule (360 mg total) by mouth daily.  Marland Kitchen EPINEPHrine (EPIPEN 2-PAK) 0.3 mg/0.3 mL IJ SOAJ injection USE AS DIRECTED  . hydrochlorothiazide (HYDRODIURIL) 25 MG tablet Take 1 tablet (25 mg total) by mouth daily.  Marland Kitchen levothyroxine (SYNTHROID, LEVOTHROID) 25 MCG tablet Take 25 mcg by mouth daily before breakfast.  . losartan (COZAAR) 100 MG tablet Take 1 tablet (100 mg total) by mouth daily.  . Polyvinyl Alcohol-Povidone (REFRESH OP) Apply to eye as  needed.   . [DISCONTINUED] EPINEPHrine (EPIPEN 2-PAK) 0.3 mg/0.3 mL IJ SOAJ injection USE AS DIRECTED  . potassium chloride (KLOR-CON) 10 MEQ tablet Take 1 tablet (10 mEq total) by mouth daily.   No facility-administered encounter medications on file as of 08/12/2020.    Review of Systems  Constitutional: Negative for appetite change and unexpected weight change.  HENT: Negative for congestion and sinus pressure.   Respiratory: Negative for cough, chest tightness and shortness of breath.   Cardiovascular: Negative for chest pain, palpitations and leg swelling.  Gastrointestinal: Negative for abdominal pain, diarrhea, nausea and vomiting.  Genitourinary: Negative for difficulty urinating and dysuria.  Musculoskeletal: Negative for joint swelling and myalgias.  Skin: Negative for color change and rash.  Neurological: Negative for dizziness, light-headedness and headaches.  Psychiatric/Behavioral: Negative for agitation and dysphoric mood.       Objective:    Physical Exam Vitals reviewed.  Constitutional:      General: She is not in acute distress.    Appearance: Normal appearance.  HENT:     Head: Normocephalic and atraumatic.     Right Ear: External ear normal.     Left Ear: External ear normal.  Eyes:     General: No scleral icterus.       Right eye: No discharge.        Left eye: No discharge.     Conjunctiva/sclera: Conjunctivae normal.  Neck:     Thyroid: No thyromegaly.  Cardiovascular:     Rate and Rhythm: Normal rate and regular rhythm.  Pulmonary:     Effort: No respiratory distress.     Breath sounds: Normal breath sounds. No wheezing.  Abdominal:     General: Bowel sounds are normal.     Palpations: Abdomen is soft.     Tenderness: There is no abdominal tenderness.  Musculoskeletal:        General: No swelling or tenderness.     Cervical back: Neck supple. No tenderness.  Lymphadenopathy:     Cervical: No cervical adenopathy.  Skin:    Findings: No  erythema or rash.  Neurological:     Mental Status: She is alert.  Psychiatric:        Mood and Affect: Mood normal.        Behavior: Behavior normal.     BP 130/68   Pulse 66   Temp 98 F (36.7 C) (Oral)   Resp 16   Ht 5' 3" (1.6 m)   Wt 138 lb 6.4 oz (62.8 kg)   LMP 12/21/1976   SpO2 99%   BMI 24.52 kg/m  Wt Readings from Last 3 Encounters:  08/12/20  138 lb 6.4 oz (62.8 kg)  07/13/20 141 lb 4 oz (64.1 kg)  04/01/20 143 lb 6.4 oz (65 kg)     Lab Results  Component Value Date   WBC 5.5 03/27/2020   HGB 12.1 03/27/2020   HCT 36.2 03/27/2020   PLT 198.0 03/27/2020   GLUCOSE 98 08/10/2020   CHOL 156 08/10/2020   TRIG 90.0 08/10/2020   HDL 51.40 08/10/2020   LDLCALC 86 08/10/2020   ALT 9 08/10/2020   AST 12 08/10/2020   NA 137 08/10/2020   K 3.0 (L) 08/10/2020   CL 99 08/10/2020   CREATININE 1.05 08/10/2020   BUN 15 08/10/2020   CO2 27 08/10/2020   TSH 1.66 08/10/2020   HGBA1C 6.6 (H) 08/10/2020   MICROALBUR <0.7 08/06/2019    MM 3D SCREEN BREAST BILATERAL  Result Date: 09/10/2019 CLINICAL DATA:  Screening. EXAM: DIGITAL SCREENING BILATERAL MAMMOGRAM WITH TOMO AND CAD COMPARISON:  Previous exam(s). ACR Breast Density Category c: The breast tissue is heterogeneously dense, which may obscure small masses. FINDINGS: There are no findings suspicious for malignancy. Images were processed with CAD. IMPRESSION: No mammographic evidence of malignancy. A result letter of this screening mammogram will be mailed directly to the patient. RECOMMENDATION: Screening mammogram in one year. (Code:SM-B-01Y) BI-RADS CATEGORY  1: Negative. Electronically Signed   By: Nolon Nations M.D.   On: 09/10/2019 11:26       Assessment & Plan:   Problem List Items Addressed This Visit    Thyroid disease    On thyroid replacement.  Follow tsh.        Hypokalemia - Primary    Discussed labs.  Started on potassium daily.  Follow potassium level.       Relevant Orders   Basic  metabolic panel   Hypercholesterolemia    On lipitor.  Low cholesterol diet and exercise.  Follow lipid panel and liver function tests.        Relevant Medications   EPINEPHrine (EPIPEN 2-PAK) 0.3 mg/0.3 mL IJ SOAJ injection   Essential hypertension, benign    Blood pressure as outlined.  Continue hctz, losartan and diltiazem.  Follow pressures.  Follow metabolic panel.        Relevant Medications   EPINEPHrine (EPIPEN 2-PAK) 0.3 mg/0.3 mL IJ SOAJ injection   Other Relevant Orders   Basic metabolic panel   Diabetes mellitus without complication (HCC)    Low carb diet and exercise.  Follow met b and a1c.        Anemia    Follow cbc.        Other Visit Diagnoses    History of bee sting allergy       Relevant Medications   EPINEPHrine (EPIPEN 2-PAK) 0.3 mg/0.3 mL IJ SOAJ injection       Einar Pheasant, MD

## 2020-08-16 ENCOUNTER — Encounter: Payer: Self-pay | Admitting: Internal Medicine

## 2020-08-16 NOTE — Assessment & Plan Note (Signed)
On thyroid replacement.  Follow tsh.  

## 2020-08-16 NOTE — Assessment & Plan Note (Signed)
Low carb diet and exercise.  Follow met b and a1c.   

## 2020-08-16 NOTE — Assessment & Plan Note (Signed)
Follow cbc.  

## 2020-08-16 NOTE — Assessment & Plan Note (Addendum)
Discussed labs.  Start kcl 33meq q day.   Follow potassium level.  Recheck in 2 weeks.

## 2020-08-16 NOTE — Assessment & Plan Note (Signed)
On lipitor.  Low cholesterol diet and exercise.  Follow lipid panel and liver function tests.   

## 2020-08-16 NOTE — Assessment & Plan Note (Signed)
Blood pressure as outlined.  Continue hctz, losartan and diltiazem.  Follow pressures.  Follow metabolic panel.

## 2020-08-26 ENCOUNTER — Other Ambulatory Visit: Payer: Self-pay

## 2020-08-26 ENCOUNTER — Other Ambulatory Visit (INDEPENDENT_AMBULATORY_CARE_PROVIDER_SITE_OTHER): Payer: Medicare HMO

## 2020-08-26 ENCOUNTER — Other Ambulatory Visit: Payer: Medicare HMO

## 2020-08-26 ENCOUNTER — Emergency Department
Admission: EM | Admit: 2020-08-26 | Discharge: 2020-08-26 | Disposition: A | Discharge status: Home / Self Care | Payer: Medicare HMO | Attending: Physician Assistant | Admitting: Physician Assistant

## 2020-08-26 DIAGNOSIS — Z79899 Other long term (current) drug therapy: Secondary | ICD-10-CM | POA: Diagnosis not present

## 2020-08-26 DIAGNOSIS — I10 Essential (primary) hypertension: Secondary | ICD-10-CM

## 2020-08-26 DIAGNOSIS — Z87891 Personal history of nicotine dependence: Secondary | ICD-10-CM | POA: Insufficient documentation

## 2020-08-26 DIAGNOSIS — T63481A Toxic effect of venom of other arthropod, accidental (unintentional), initial encounter: Secondary | ICD-10-CM | POA: Diagnosis not present

## 2020-08-26 DIAGNOSIS — T7840XA Allergy, unspecified, initial encounter: Secondary | ICD-10-CM | POA: Diagnosis not present

## 2020-08-26 DIAGNOSIS — E876 Hypokalemia: Secondary | ICD-10-CM

## 2020-08-26 DIAGNOSIS — L500 Allergic urticaria: Secondary | ICD-10-CM | POA: Insufficient documentation

## 2020-08-26 LAB — BASIC METABOLIC PANEL
BUN: 13 mg/dL (ref 6–23)
CO2: 28 mEq/L (ref 19–32)
Calcium: 10 mg/dL (ref 8.4–10.5)
Chloride: 101 mEq/L (ref 96–112)
Creatinine, Ser: 0.84 mg/dL (ref 0.40–1.20)
GFR: 67.09 mL/min (ref >60.00)
Glucose, Bld: 104 mg/dL — ABNORMAL HIGH (ref 70–99)
Potassium: 3.9 mEq/L (ref 3.5–5.1)
Sodium: 138 mEq/L (ref 135–145)

## 2020-08-26 MED ORDER — TRIAMCINOLONE ACETONIDE 0.025 % EX CREA: 1.0000 "application " | TOPICAL_CREAM | Freq: Two times a day (BID) | CUTANEOUS | 0 refills | Status: DC

## 2020-08-26 NOTE — ED Triage Notes (Addendum)
Pt states tonight she noted some itching and hives to left hip area and right thigh area. No swelling noted to face or mouth, no shob noted. Pt denies any new exposures, did not take any otc meds prior to coming in.

## 2020-08-26 NOTE — ED Provider Notes (Signed)
[facility] Emergency Department Provider Note   ____________________________________________   None    (approximate)  I have reviewed the triage vital signs and the nursing notes.   HISTORY  Chief Complaint Allergic Reaction    HPI Christina Cole is a 79 y.o. female patient presents with itching and hives to the bilateral hip/thigh area.  Patient denies any lip or tongue edema.  Patient denies shortness of breath.  Patient said no new foods, personal hygiene, or cleaning products.  Incident started last night.  Patient states this been decreased swelling and itching since her arrival.         Past Medical History:  Diagnosis Date  . Heart murmur   . Hyperlipidemia   . Hypertension   . Thyroid disease    goiter (follows with Dr. Ronnald Collum)    Patient Active Problem List   Diagnosis Date Noted  . Heart murmur 08/11/2019  . Sleep difficulties 08/11/2019  . Lip swelling 03/30/2019  . Finger pain, left 11/21/2018  . Low back pain 11/21/2018  . Diabetes mellitus without complication ([place]) 37/90/2409  . Benign breast cyst in female, left 09/29/2017  . Mass of upper inner quadrant of right breast 09/29/2017  . Abnormal mammogram 09/26/2017  . Family history of polyps in the colon   . Benign neoplasm of transverse colon   . Perforation of intestine ([place])   . Polyp of sigmoid colon   . Health care maintenance 01/11/2015  . Osteoarthritis of right knee 10/08/2014  . Hypokalemia 06/12/2013  . Anemia 06/12/2013  . Essential hypertension, benign 12/30/2012  . Hypercholesterolemia 12/30/2012  . Thyroid disease 12/30/2012  . Environmental allergies 12/30/2012    Past Surgical History:  Procedure Laterality Date  . ABDOMINAL HYSTERECTOMY  1978   ovaries left in place  . APPENDECTOMY  1978  . BREAST BIOPSY Right 09/29/2017   Pt had Bx  done in [facility]'s office [place] @ 3:00 per chart-"FIBROCYSTIC CHANGES".  . COLONOSCOPY WITH PROPOFOL N/A  07/11/2017   Procedure: COLONOSCOPY WITH PROPOFOL;  Surgeon: Lucilla Lame, MD;  Location: [facility] ENDOSCOPY;  Service: Endoscopy;  Laterality: N/A;  . TUBAL LIGATION      Prior to Admission medications   Medication Sig Start Date End Date Taking? Authorizing Provider  atorvastatin (LIPITOR) 40 MG tablet Take 1 tablet (40 mg total) by mouth daily. 08/08/19   Einar Pheasant, MD  diltiazem (TIAZAC) 360 MG 24 hr capsule Take 1 capsule (360 mg total) by mouth daily. 08/08/19   Einar Pheasant, MD  EPINEPHrine (EPIPEN 2-PAK) 0.3 mg/0.3 mL IJ SOAJ injection USE AS DIRECTED 08/12/20   Einar Pheasant, MD  hydrochlorothiazide (HYDRODIURIL) 25 MG tablet Take 1 tablet (25 mg total) by mouth daily. 03/17/20 03/12/21  Kate Sable, MD  levothyroxine (SYNTHROID, LEVOTHROID) 25 MCG tablet Take 25 mcg by mouth daily before breakfast.    [provider]  losartan (COZAAR) 100 MG tablet Take 1 tablet (100 mg total) by mouth daily. 08/08/19   Einar Pheasant, MD  Polyvinyl Alcohol-Povidone (REFRESH OP) Apply to eye as needed.     [provider]  potassium chloride (KLOR-CON) 10 MEQ tablet Take 1 tablet (10 mEq total) by mouth daily. 08/12/20   Einar Pheasant, MD  triamcinolone (KENALOG) 0.025 % cream Apply 1 application topically 2 (two) times daily. 08/26/20   Sable Feil, PA-C    Allergies Sulfa antibiotics  Family History  Problem Relation Age of Onset  . Stroke Mother   . Hypertension Mother   .  Arthritis Mother   . Breast cancer Sister 92  . Breast cancer Sister 58  . Lung cancer Brother   . Diabetes Brother     Social History Social History   Tobacco Use  . Smoking status: Former Research scientist (life sciences)  . Smokeless tobacco: Never Used  Vaping Use  . Vaping Use: Never used  Substance Use Topics  . Alcohol use: No    Alcohol/week: 0.0 standard drinks  . Drug use: No    Review of Systems  Constitutional: No fever/chills Eyes: No visual changes. ENT: No sore  throat. Cardiovascular: Denies chest pain. Respiratory: Denies shortness of breath. Gastrointestinal: No abdominal pain.  No nausea, no vomiting.  No diarrhea.  No constipation. Genitourinary: Negative for dysuria. Musculoskeletal: Negative for back pain. Skin: Positive for rash. Neurological: Negative for headaches, focal weakness or numbness.  Endocrine:  Diabetes, hyperlipidemia, hypertension, and hypothyroidism. Hematological/Lymphatic:  Allergic/Immunilogical: Sulfur antibiotics. ____________________________________________   PHYSICAL EXAM:  VITAL SIGNS: ED Triage Vitals  Enc Vitals Group     BP 08/26/20 0126 (!) 187/72     Pulse Rate 08/26/20 0126 76     Resp 08/26/20 0126 20     Temp 08/26/20 0126 98.6 F (37 C)     Temp Source 08/26/20 0126 Oral     SpO2 08/26/20 0126 96 %     Weight 08/26/20 0127 140 lb (63.5 kg)     Height 08/26/20 0127 5\' 3"  (1.6 m)     Head Circumference --      Peak Flow --      Pain Score 08/26/20 0126 0     Pain Loc --      Pain Edu? --      Excl. in [place]? --    Constitutional: Alert and oriented. Well appearing and in no acute distress. Cardiovascular: Normal rate, regular rhythm. Grossly normal heart sounds.  Good peripheral circulation.  Elevated blood pressure. Respiratory: Normal respiratory effort.  No retractions. Lungs CTAB. Genitourinary: Deferred Musculoskeletal: No lower extremity tenderness nor edema.  No joint effusions. Neurologic:  Normal speech and language. No gross focal neurologic deficits are appreciated. No gait instability. Skin:  Skin is warm, dry and intact.  Wheals bilateral lateral thigh.   Psychiatric: Mood and affect are normal. Speech and behavior are normal.  ____________________________________________   LABS (all labs ordered are listed, but only abnormal results are displayed)  Labs Reviewed - No data to  display ____________________________________________  EKG   ____________________________________________  RADIOLOGY I, Sable Feil, personally viewed and evaluated these images (plain radiographs) as part of my medical decision making, as well as reviewing the written report by the radiologist.  ED MD interpretation:    Official radiology report(s): No results found.  ____________________________________________   PROCEDURES  Procedure(s) performed (including Critical Care):  Procedures   ____________________________________________   INITIAL IMPRESSION / ASSESSMENT AND PLAN / ED COURSE  As part of my medical decision making, I reviewed the following data within the [place]         Patient presents with redness and swelling to the bilateral thigh.  Patient complaint physical exam consistent with localized reaction to insect bite.  Patient given discharge care instructions and a prescription for [place].  Patient  also take over-the-counter Benadryl 25 mg for 2 to 3 days as needed.      ____________________________________________   FINAL CLINICAL IMPRESSION(S) / ED DIAGNOSES  Final diagnoses:  Allergic reaction, initial encounter     ED Discharge Orders  Ordered    triamcinolone (KENALOG) 0.025 % cream  2 times daily        08/26/20 7915          *Please note:  Christina Cole was evaluated in Emergency Department on 08/26/2020 for the symptoms described in the history of present illness. She was evaluated in the context of the global COVID-19 pandemic, which necessitated consideration that the patient might be at risk for infection with the SARS-CoV-2 virus that causes COVID-19. Institutional protocols and algorithms that pertain to the evaluation of patients at risk for COVID-19 are in a state of rapid change based on information released by regulatory bodies including the CDC and federal and state organizations. These  policies and algorithms were followed during the patient's care in the ED.  Some ED evaluations and interventions may be delayed as a result of limited staffing during and the pandemic.*   Note:  This document was prepared using Dragon voice recognition software and may include unintentional dictation errors.    Sable Feil, PA-C 08/26/20 0569    Carrie Mew, MD 08/26/20 1257

## 2020-08-26 NOTE — Discharge Instructions (Addendum)
Follow discharge care instructions and apply hydrocortisone as directed and take over-the-counter Benadryl twice a day for 2 to 3 days.

## 2020-08-27 ENCOUNTER — Other Ambulatory Visit: Payer: Self-pay | Admitting: Internal Medicine

## 2020-08-27 ENCOUNTER — Telehealth: Payer: Self-pay | Admitting: Internal Medicine

## 2020-08-27 DIAGNOSIS — E876 Hypokalemia: Secondary | ICD-10-CM

## 2020-08-27 NOTE — Progress Notes (Signed)
Order placed for f/u potassium check.  

## 2020-08-27 NOTE — Telephone Encounter (Signed)
See result note.  

## 2020-08-27 NOTE — Telephone Encounter (Signed)
Patient was returning call for result

## 2020-08-28 ENCOUNTER — Other Ambulatory Visit: Payer: Self-pay

## 2020-08-28 MED ORDER — POTASSIUM CHLORIDE ER 10 MEQ PO TBCR: 10.0000 meq | EXTENDED_RELEASE_TABLET | Freq: Every day | ORAL | 0 refills | Status: DC

## 2020-09-09 ENCOUNTER — Other Ambulatory Visit: Payer: Self-pay

## 2020-09-09 ENCOUNTER — Ambulatory Visit
Admission: RE | Admit: 2020-09-09 | Discharge: 2020-09-09 | Disposition: A | Discharge status: Home / Self Care | Payer: Medicare HMO | Source: Ambulatory Visit | Attending: Internal Medicine | Admitting: Internal Medicine

## 2020-09-09 DIAGNOSIS — Z1231 Encounter for screening mammogram for malignant neoplasm of breast: Secondary | ICD-10-CM | POA: Insufficient documentation

## 2020-09-10 DIAGNOSIS — Z961 Presence of intraocular lens: Secondary | ICD-10-CM | POA: Diagnosis not present

## 2020-09-18 ENCOUNTER — Other Ambulatory Visit (INDEPENDENT_AMBULATORY_CARE_PROVIDER_SITE_OTHER): Payer: Medicare HMO

## 2020-09-18 ENCOUNTER — Other Ambulatory Visit: Payer: Self-pay

## 2020-09-18 DIAGNOSIS — E876 Hypokalemia: Secondary | ICD-10-CM | POA: Diagnosis not present

## 2020-09-18 LAB — POTASSIUM: Potassium: 3.6 mEq/L (ref 3.5–5.1)

## 2020-09-25 ENCOUNTER — Other Ambulatory Visit: Payer: Self-pay | Admitting: Internal Medicine

## 2020-10-03 ENCOUNTER — Other Ambulatory Visit: Payer: Self-pay | Admitting: Internal Medicine

## 2020-12-14 ENCOUNTER — Other Ambulatory Visit: Payer: Self-pay

## 2020-12-14 ENCOUNTER — Encounter: Payer: Self-pay | Admitting: Internal Medicine

## 2020-12-14 ENCOUNTER — Ambulatory Visit (INDEPENDENT_AMBULATORY_CARE_PROVIDER_SITE_OTHER): Payer: Medicare HMO | Admitting: Internal Medicine

## 2020-12-14 VITALS — BP 120/60 | HR 66 | Temp 98.3°F | Resp 16 | Ht 63.0 in | Wt 141.0 lb

## 2020-12-14 DIAGNOSIS — E119 Type 2 diabetes mellitus without complications: Secondary | ICD-10-CM | POA: Diagnosis not present

## 2020-12-14 DIAGNOSIS — Z0000 Encounter for general adult medical examination without abnormal findings: Secondary | ICD-10-CM

## 2020-12-14 DIAGNOSIS — E7800 Pure hypercholesterolemia, unspecified: Secondary | ICD-10-CM

## 2020-12-14 DIAGNOSIS — E079 Disorder of thyroid, unspecified: Secondary | ICD-10-CM | POA: Diagnosis not present

## 2020-12-14 DIAGNOSIS — R198 Other specified symptoms and signs involving the digestive system and abdomen: Secondary | ICD-10-CM | POA: Diagnosis not present

## 2020-12-14 DIAGNOSIS — I10 Essential (primary) hypertension: Secondary | ICD-10-CM

## 2020-12-14 DIAGNOSIS — Z1159 Encounter for screening for other viral diseases: Secondary | ICD-10-CM

## 2020-12-14 DIAGNOSIS — D649 Anemia, unspecified: Secondary | ICD-10-CM | POA: Diagnosis not present

## 2020-12-14 LAB — HM DIABETES FOOT EXAM

## 2020-12-14 NOTE — Assessment & Plan Note (Signed)
Physical today 12/14/20.  Mammogram 09/11/20 - Briads I.  Colonoscopy 07/2017.  Recommended f/u in 5 years.

## 2020-12-14 NOTE — Progress Notes (Signed)
Patient ID: Christina Cole, female   DOB: 04-29-1941, 80 y.o.   MRN: [id number]   Subjective:    Patient ID: Christina Cole, female    DOB: December 31, 1940, 80 y.o.   MRN: [id number]  HPI This visit occurred during the SARS-CoV-2 public health emergency.  Safety protocols were in place, including screening questions prior to the visit, additional usage of staff PPE, and extensive cleaning of exam room while observing appropriate contact time as indicated for disinfecting solutions.  Patient here for her physical exam.  She is doing well.  Feels good.  Stays active.  No chest pain or sob with increased activity or exertion.  Has been seeing Dr Gloriann Loan for eye issues.  S/p cataract removal.  Has been on steroid and abx drops.  Stable.  Has noticed a full sensation recently.  Eating light.  Some minimal constipation.  No abdominal pain.  Blood pressure dong well.  No vomiting.    Past Medical History:  Diagnosis Date  . Heart murmur   . Hyperlipidemia   . Hypertension   . Thyroid disease    goiter (follows with Dr. Ronnald Collum)   Past Surgical History:  Procedure Laterality Date  . ABDOMINAL HYSTERECTOMY  1978   ovaries left in place  . APPENDECTOMY  1978  . BREAST BIOPSY Right 09/29/2017   Pt had Bx  done in [facility]'s office [place] @ 3:00 per chart-"FIBROCYSTIC CHANGES".  . COLONOSCOPY WITH PROPOFOL N/A 07/11/2017   Procedure: COLONOSCOPY WITH PROPOFOL;  Surgeon: Lucilla Lame, MD;  Location: [facility] ENDOSCOPY;  Service: Endoscopy;  Laterality: N/A;  . TUBAL LIGATION     Family History  Problem Relation Age of Onset  . Stroke Mother   . Hypertension Mother   . Arthritis Mother   . Breast cancer Sister 58  . Breast cancer Sister 8  . Lung cancer Brother   . Diabetes Brother    Social History   Socioeconomic History  . Marital status: Single    Spouse name: Not on file  . Number of children: 3  . Years of education: Not on file  . Highest education level: Not on file  Occupational  History  . Not on file  Tobacco Use  . Smoking status: Former Research scientist (life sciences)  . Smokeless tobacco: Never Used  Vaping Use  . Vaping Use: Never used  Substance and Sexual Activity  . Alcohol use: No    Alcohol/week: 0.0 standard drinks  . Drug use: No  . Sexual activity: Not on file  Other Topics Concern  . Not on file  Social History Narrative  . Not on file   Social Determinants of Health   Financial Resource Strain: Not on file  Food Insecurity: Not on file  Transportation Needs: Not on file  Physical Activity: Not on file  Stress: Not on file  Social Connections: Not on file    Outpatient Encounter Medications as of 12/14/2020  Medication Sig  . atorvastatin (LIPITOR) 40 MG tablet TAKE 1 TABLET EVERY DAY  . diltiazem (TIAZAC) 360 MG 24 hr capsule TAKE 1 CAPSULE EVERY DAY  . EPINEPHrine (EPIPEN 2-PAK) 0.3 mg/0.3 mL IJ SOAJ injection USE AS DIRECTED  . hydrochlorothiazide (HYDRODIURIL) 25 MG tablet Take 1 tablet (25 mg total) by mouth daily.  Marland Kitchen levothyroxine (SYNTHROID, LEVOTHROID) 25 MCG tablet Take 25 mcg by mouth daily before breakfast.  . losartan (COZAAR) 100 MG tablet TAKE 1 TABLET EVERY DAY  . Polyvinyl Alcohol-Povidone (REFRESH OP) Apply to eye as needed.   Marland Kitchen  triamcinolone (KENALOG) 0.025 % cream Apply 1 application topically 2 (two) times daily.  . [DISCONTINUED] potassium chloride (KLOR-CON) 10 MEQ tablet TAKE 1 TABLET BY MOUTH EVERY DAY   No facility-administered encounter medications on file as of 12/14/2020.    Review of Systems  Constitutional: Negative for unexpected weight change.       Eating light.   HENT: Negative for congestion, sinus pressure and sore throat.   Eyes: Negative for pain and visual disturbance.  Respiratory: Negative for cough, chest tightness and shortness of breath.   Cardiovascular: Negative for chest pain, palpitations and leg swelling.  Gastrointestinal: Negative for abdominal pain, diarrhea, nausea and vomiting.       Fullness. Minimal  constipation as outlined.    Genitourinary: Negative for difficulty urinating and dysuria.  Musculoskeletal: Negative for joint swelling and myalgias.  Skin: Negative for color change and rash.  Neurological: Negative for dizziness, light-headedness and headaches.  Hematological: Negative for adenopathy. Does not bruise/bleed easily.  Psychiatric/Behavioral: Negative for agitation and dysphoric mood.       Objective:    Physical Exam Vitals reviewed.  Constitutional:      General: She is not in acute distress.    Appearance: Normal appearance. She is well-developed and well-nourished.  HENT:     Head: Normocephalic and atraumatic.     Right Ear: External ear normal.     Left Ear: External ear normal.     Mouth/Throat:     Mouth: Oropharynx is clear and moist.  Eyes:     General: No scleral icterus.       Right eye: No discharge.        Left eye: No discharge.     Conjunctiva/sclera: Conjunctivae normal.  Neck:     Thyroid: No thyromegaly.  Cardiovascular:     Rate and Rhythm: Normal rate and regular rhythm.  Pulmonary:     Effort: No tachypnea, accessory muscle usage or respiratory distress.     Breath sounds: Normal breath sounds. No decreased breath sounds or wheezing.  Chest:  Breasts:     Right: No inverted nipple, mass, nipple discharge or tenderness (no axillary adenopathy).     Left: No inverted nipple, mass, nipple discharge or tenderness (no axilarry adenopathy).    Abdominal:     General: Bowel sounds are normal.     Palpations: Abdomen is soft.     Tenderness: There is no abdominal tenderness.  Musculoskeletal:        General: No swelling, tenderness or edema.     Cervical back: Neck supple. No tenderness.  Lymphadenopathy:     Cervical: No cervical adenopathy.  Skin:    Findings: No erythema or rash.  Neurological:     Mental Status: She is alert and oriented to person, place, and time.  Psychiatric:        Mood and Affect: Mood and affect and mood  normal.        Behavior: Behavior normal.     BP 120/60   Pulse 66   Temp 98.3 F (36.8 C) (Oral)   Resp 16   Ht _0  (1.6 m)   Wt 141 lb (64 kg)   LMP 12/21/1976   SpO2 98%   BMI 24.98 kg/m  Wt Readings from Last 3 Encounters:  12/14/20 141 lb (64 kg)  08/26/20 140 lb (63.5 kg)  08/12/20 138 lb 6.4 oz (62.8 kg)     Lab Results  Component Value Date   WBC 5.5 03/27/2020  HGB 12.1 03/27/2020   HCT 36.2 03/27/2020   PLT 198.0 03/27/2020   GLUCOSE 82 12/14/2020   CHOL 168 12/14/2020   TRIG 106.0 12/14/2020   HDL 59.80 12/14/2020   LDLCALC 87 12/14/2020   ALT 11 12/14/2020   AST 13 12/14/2020   NA 139 12/14/2020   K 3.4 (L) 12/14/2020   CL 101 12/14/2020   CREATININE 0.97 12/14/2020   BUN 13 12/14/2020   CO2 29 12/14/2020   TSH 1.66 08/10/2020   HGBA1C 6.4 12/14/2020   MICROALBUR <0.7 12/14/2020    MM 3D SCREEN BREAST BILATERAL  Result Date: 09/11/2020 CLINICAL DATA:  Screening. EXAM: DIGITAL SCREENING BILATERAL MAMMOGRAM WITH TOMO AND CAD COMPARISON:  Previous exam(s). ACR Breast Density Category c: The breast tissue is heterogeneously dense, which may obscure small masses. FINDINGS: There are no findings suspicious for malignancy. Images were processed with CAD. IMPRESSION: No mammographic evidence of malignancy. A result letter of this screening mammogram will be mailed directly to the patient. RECOMMENDATION: Screening mammogram in one year. (Code:SM-B-01Y) BI-RADS CATEGORY  1: Negative. Electronically Signed   By: Everlean Alstrom M.D.   On: 09/11/2020 08:53       Assessment & Plan:   Problem List Items Addressed This Visit    Abdominal fullness    Fullness as outlined.  Check routine labs and check urine and amylase and lipase.  Minimal constipation.  Keep bowels moving.  Can add miralax.        Relevant Orders   Lipase (Completed)   Amylase (Completed)   Urinalysis, Routine w reflex microscopic (Completed)   Anemia    Follow cbc.        Diabetes mellitus without complication (HCC)    Low carb diet and exercise. Follow met b and a1c.       Relevant Orders   Hemoglobin A1c (Completed)   Microalbumin / creatinine urine ratio (Completed)   Essential hypertension, benign    Continue hctz, losartan and diltiazem.  Follow pressures.  Follow metabolic panel.       Relevant Orders   Basic metabolic panel (Completed)   Health care maintenance    Physical today 12/14/20.  Mammogram 09/11/20 - Briads I.  Colonoscopy 07/2017.  Recommended f/u in 5 years.        Hypercholesterolemia - Primary    On lipitor.  Low cholesterol diet and exercise.  Follow lipid panel and liver function tests.        Relevant Orders   Hepatic function panel (Completed)   Lipid panel (Completed)   Thyroid disease    On thyroid replacement.  Follow tsh.        Other Visit Diagnoses    Need for hepatitis C screening test       Relevant Orders   Hepatitis C antibody (Completed)       Einar Pheasant, MD

## 2020-12-15 LAB — HEPATIC FUNCTION PANEL
ALT: 11 U/L (ref 0–35)
AST: 13 U/L (ref 0–37)
Albumin: 4.5 g/dL (ref 3.5–5.2)
Alkaline Phosphatase: 59 U/L (ref 39–117)
Bilirubin, Direct: 0.2 mg/dL (ref 0.0–0.3)
Total Bilirubin: 1.4 mg/dL — ABNORMAL HIGH (ref 0.2–1.2)
Total Protein: 7.8 g/dL (ref 6.0–8.3)

## 2020-12-15 LAB — URINALYSIS, ROUTINE W REFLEX MICROSCOPIC
Bilirubin Urine: NEGATIVE
Hgb urine dipstick: NEGATIVE
Ketones, ur: NEGATIVE
Leukocytes,Ua: NEGATIVE
Nitrite: NEGATIVE
RBC / HPF: NONE SEEN (ref 0–?)
Specific Gravity, Urine: <=1.005 — AB (ref 1.000–1.030)
Total Protein, Urine: NEGATIVE
Urine Glucose: NEGATIVE
Urobilinogen, UA: 0.2 (ref 0.0–1.0)
WBC, UA: NONE SEEN (ref 0–?)
pH: 6 (ref 5.0–8.0)

## 2020-12-15 LAB — MICROALBUMIN / CREATININE URINE RATIO
Creatinine,U: 25 mg/dL
Microalb Creat Ratio: 2.8 mg/g (ref 0.0–30.0)
Microalb, Ur: <0.7 mg/dL (ref 0.0–1.9)

## 2020-12-15 LAB — AMYLASE: Amylase: 47 U/L (ref 27–131)

## 2020-12-15 LAB — LIPID PANEL
Cholesterol: 168 mg/dL (ref 0–200)
HDL: 59.8 mg/dL (ref >39.00)
LDL Cholesterol: 87 mg/dL (ref 0–99)
NonHDL: 108.67
Total CHOL/HDL Ratio: 3
Triglycerides: 106 mg/dL (ref 0.0–149.0)
VLDL: 21.2 mg/dL (ref 0.0–40.0)

## 2020-12-15 LAB — BASIC METABOLIC PANEL
BUN: 13 mg/dL (ref 6–23)
CO2: 29 mEq/L (ref 19–32)
Calcium: 10 mg/dL (ref 8.4–10.5)
Chloride: 101 mEq/L (ref 96–112)
Creatinine, Ser: 0.97 mg/dL (ref 0.40–1.20)
GFR: 56.33 mL/min — ABNORMAL LOW (ref >60.00)
Glucose, Bld: 82 mg/dL (ref 70–99)
Potassium: 3.4 mEq/L — ABNORMAL LOW (ref 3.5–5.1)
Sodium: 139 mEq/L (ref 135–145)

## 2020-12-15 LAB — HEPATITIS C ANTIBODY
Hepatitis C Ab: NONREACTIVE
SIGNAL TO CUT-OFF: 0.01 (ref <1.00)

## 2020-12-15 LAB — HEMOGLOBIN A1C: Hgb A1c MFr Bld: 6.4 % (ref 4.6–6.5)

## 2020-12-15 LAB — LIPASE: Lipase: 31 U/L (ref 11.0–59.0)

## 2020-12-21 ENCOUNTER — Other Ambulatory Visit: Payer: Self-pay | Admitting: Internal Medicine

## 2020-12-21 DIAGNOSIS — E876 Hypokalemia: Secondary | ICD-10-CM

## 2020-12-21 DIAGNOSIS — R198 Other specified symptoms and signs involving the digestive system and abdomen: Secondary | ICD-10-CM

## 2020-12-21 NOTE — Progress Notes (Signed)
Order placed for f/u labs.  

## 2020-12-21 NOTE — Assessment & Plan Note (Signed)
Follow cbc.  

## 2020-12-21 NOTE — Assessment & Plan Note (Signed)
On lipitor.  Low cholesterol diet and exercise.  Follow lipid panel and liver function tests.   

## 2020-12-21 NOTE — Assessment & Plan Note (Signed)
Low carb diet and exercise. Follow met b and a1c.  

## 2020-12-21 NOTE — Assessment & Plan Note (Signed)
Fullness as outlined.  Check routine labs and check urine and amylase and lipase.  Minimal constipation.  Keep bowels moving.  Can add miralax.

## 2020-12-21 NOTE — Assessment & Plan Note (Signed)
On thyroid replacement.  Follow tsh.  

## 2020-12-21 NOTE — Assessment & Plan Note (Signed)
Continue hctz, losartan and diltiazem.  Follow pressures.  Follow metabolic panel.

## 2020-12-22 ENCOUNTER — Other Ambulatory Visit: Payer: Self-pay

## 2020-12-22 MED ORDER — POTASSIUM CHLORIDE ER 10 MEQ PO TBCR
10.0000 meq | EXTENDED_RELEASE_TABLET | Freq: Every day | ORAL | 0 refills | Status: DC
Start: 2020-12-22 — End: 2021-01-12

## 2020-12-25 ENCOUNTER — Other Ambulatory Visit: Payer: Self-pay

## 2020-12-25 ENCOUNTER — Ambulatory Visit
Admission: RE | Admit: 2020-12-25 | Discharge: 2020-12-25 | Disposition: A | Discharge status: Home / Self Care | Payer: Medicare HMO | Source: Ambulatory Visit | Attending: Internal Medicine | Admitting: Internal Medicine

## 2020-12-25 DIAGNOSIS — R198 Other specified symptoms and signs involving the digestive system and abdomen: Secondary | ICD-10-CM | POA: Insufficient documentation

## 2020-12-25 DIAGNOSIS — N281 Cyst of kidney, acquired: Secondary | ICD-10-CM | POA: Diagnosis not present

## 2021-01-04 ENCOUNTER — Other Ambulatory Visit: Payer: Self-pay

## 2021-01-04 ENCOUNTER — Other Ambulatory Visit (INDEPENDENT_AMBULATORY_CARE_PROVIDER_SITE_OTHER): Payer: Medicare HMO

## 2021-01-04 DIAGNOSIS — E876 Hypokalemia: Secondary | ICD-10-CM

## 2021-01-04 LAB — HEPATIC FUNCTION PANEL
ALT: 11 U/L (ref 0–35)
AST: 13 U/L (ref 0–37)
Albumin: 4 g/dL (ref 3.5–5.2)
Alkaline Phosphatase: 54 U/L (ref 39–117)
Bilirubin, Direct: 0.1 mg/dL (ref 0.0–0.3)
Total Bilirubin: 0.7 mg/dL (ref 0.2–1.2)
Total Protein: 6.9 g/dL (ref 6.0–8.3)

## 2021-01-04 LAB — POTASSIUM: Potassium: 3.4 mEq/L — ABNORMAL LOW (ref 3.5–5.1)

## 2021-01-12 ENCOUNTER — Other Ambulatory Visit: Payer: Self-pay

## 2021-01-12 MED ORDER — POTASSIUM CHLORIDE ER 10 MEQ PO TBCR
10.0000 meq | EXTENDED_RELEASE_TABLET | Freq: Two times a day (BID) | ORAL | 0 refills | Status: DC
Start: 2021-01-12 — End: 2021-02-05

## 2021-01-18 DIAGNOSIS — Z961 Presence of intraocular lens: Secondary | ICD-10-CM | POA: Diagnosis not present

## 2021-01-21 ENCOUNTER — Telehealth: Payer: Self-pay | Admitting: *Deleted

## 2021-01-21 DIAGNOSIS — E876 Hypokalemia: Secondary | ICD-10-CM

## 2021-01-21 NOTE — Telephone Encounter (Signed)
Order placed for f/u potassium.  

## 2021-01-21 NOTE — Telephone Encounter (Signed)
Please place future orders for lab appt.  

## 2021-01-25 ENCOUNTER — Other Ambulatory Visit (INDEPENDENT_AMBULATORY_CARE_PROVIDER_SITE_OTHER): Payer: Medicare HMO

## 2021-01-25 ENCOUNTER — Other Ambulatory Visit: Payer: Self-pay

## 2021-01-25 DIAGNOSIS — E876 Hypokalemia: Secondary | ICD-10-CM | POA: Diagnosis not present

## 2021-01-25 LAB — POTASSIUM: Potassium: 3.5 mEq/L (ref 3.5–5.1)

## 2021-01-26 ENCOUNTER — Other Ambulatory Visit: Payer: Self-pay | Admitting: Internal Medicine

## 2021-01-26 DIAGNOSIS — E876 Hypokalemia: Secondary | ICD-10-CM

## 2021-01-26 NOTE — Progress Notes (Signed)
Order placed for f/u potassium.  

## 2021-02-05 ENCOUNTER — Other Ambulatory Visit: Payer: Self-pay | Admitting: Internal Medicine

## 2021-02-10 ENCOUNTER — Other Ambulatory Visit: Payer: Self-pay | Admitting: Physician Assistant

## 2021-02-11 ENCOUNTER — Telehealth: Payer: Self-pay | Admitting: Internal Medicine

## 2021-02-11 NOTE — Telephone Encounter (Signed)
Left message for patient to call back and schedule Medicare Annual Wellness Visit (AWV)   Please schedule on the [place] template  Last AWV 4/83/50; please schedule at anytime   This should be a 45 minute visit

## 2021-02-23 ENCOUNTER — Other Ambulatory Visit (INDEPENDENT_AMBULATORY_CARE_PROVIDER_SITE_OTHER): Payer: Medicare HMO

## 2021-02-23 ENCOUNTER — Other Ambulatory Visit: Payer: Self-pay

## 2021-02-23 DIAGNOSIS — E876 Hypokalemia: Secondary | ICD-10-CM | POA: Diagnosis not present

## 2021-02-23 LAB — POTASSIUM: Potassium: 3.5 mEq/L (ref 3.5–5.1)

## 2021-03-12 DIAGNOSIS — E041 Nontoxic single thyroid nodule: Secondary | ICD-10-CM | POA: Diagnosis not present

## 2021-03-12 DIAGNOSIS — I10 Essential (primary) hypertension: Secondary | ICD-10-CM | POA: Diagnosis not present

## 2021-03-12 DIAGNOSIS — E049 Nontoxic goiter, unspecified: Secondary | ICD-10-CM | POA: Diagnosis not present

## 2021-03-12 DIAGNOSIS — E039 Hypothyroidism, unspecified: Secondary | ICD-10-CM | POA: Diagnosis not present

## 2021-03-12 DIAGNOSIS — E785 Hyperlipidemia, unspecified: Secondary | ICD-10-CM | POA: Diagnosis not present

## 2021-03-17 ENCOUNTER — Ambulatory Visit (INDEPENDENT_AMBULATORY_CARE_PROVIDER_SITE_OTHER): Payer: Medicare HMO | Admitting: Internal Medicine

## 2021-03-17 ENCOUNTER — Encounter: Payer: Self-pay | Admitting: Internal Medicine

## 2021-03-17 ENCOUNTER — Other Ambulatory Visit: Payer: Self-pay

## 2021-03-17 DIAGNOSIS — I700 Atherosclerosis of aorta: Secondary | ICD-10-CM | POA: Diagnosis not present

## 2021-03-17 DIAGNOSIS — Z9109 Other allergy status, other than to drugs and biological substances: Secondary | ICD-10-CM

## 2021-03-17 DIAGNOSIS — E7800 Pure hypercholesterolemia, unspecified: Secondary | ICD-10-CM | POA: Diagnosis not present

## 2021-03-17 DIAGNOSIS — I10 Essential (primary) hypertension: Secondary | ICD-10-CM | POA: Diagnosis not present

## 2021-03-17 DIAGNOSIS — E1165 Type 2 diabetes mellitus with hyperglycemia: Secondary | ICD-10-CM

## 2021-03-17 DIAGNOSIS — D649 Anemia, unspecified: Secondary | ICD-10-CM

## 2021-03-17 MED ORDER — HYDROCHLOROTHIAZIDE 12.5 MG PO CAPS: 12.5000 mg | ORAL_CAPSULE | Freq: Every day | ORAL | 2 refills | Status: DC

## 2021-03-17 MED ORDER — POTASSIUM CHLORIDE ER 10 MEQ PO TBCR
10.0000 meq | EXTENDED_RELEASE_TABLET | Freq: Every day | ORAL | 0 refills | Status: DC
Start: 2021-03-17 — End: 2021-07-14

## 2021-03-17 NOTE — Patient Instructions (Signed)
Change potassium to one per day  Decrease hctz to 12.5mg  per day.

## 2021-03-17 NOTE — Progress Notes (Signed)
Patient ID: Christina Cole, female   DOB: [birth year], 80 y.o.   MRN: [id number]   Subjective:    Patient ID: Christina Cole, female    DOB: Mar 10, 1941, 80 y.o.   MRN: [id number]  HPI This visit occurred during the SARS-CoV-2 public health emergency.  Safety protocols were in place, including screening questions prior to the visit, additional usage of staff PPE, and extensive cleaning of exam room while observing appropriate contact time as indicated for disinfecting solutions.  Patient here for a scheduled follow up.  Here to follow up regarding her blood pressure.  She reports she is doing relatively well.  Stays active.  No chest pain.  No sob.  No acid reflux.  No abdominal pain.  Bowels moving.  Noticed approximately 10 days ago - some light headedness.  No persistent increased dizziness.  Discussed slow position changes.  No increased heart rate or palpitations.     Past Medical History:  Diagnosis Date  . Heart murmur   . Hyperlipidemia   . Hypertension   . Thyroid disease    goiter (follows with Dr. Ronnald Collum)   Past Surgical History:  Procedure Laterality Date  . ABDOMINAL HYSTERECTOMY  1978   ovaries left in place  . APPENDECTOMY  1978  . BREAST BIOPSY Right 09/29/2017   Pt had Bx  done in [facility]'s office [place] @ 3:00 per chart-"FIBROCYSTIC CHANGES".  . COLONOSCOPY WITH PROPOFOL N/A 07/11/2017   Procedure: COLONOSCOPY WITH PROPOFOL;  Surgeon: Lucilla Lame, MD;  Location: [facility] ENDOSCOPY;  Service: Endoscopy;  Laterality: N/A;  . TUBAL LIGATION     Family History  Problem Relation Age of Onset  . Stroke Mother   . Hypertension Mother   . Arthritis Mother   . Breast cancer Sister 16  . Breast cancer Sister 94  . Lung cancer Brother   . Diabetes Brother    Social History   Socioeconomic History  . Marital status: Single    Spouse name: Not on file  . Number of children: 3  . Years of education: Not on file  . Highest education level: Not on file  Occupational  History  . Not on file  Tobacco Use  . Smoking status: Former Research scientist (life sciences)  . Smokeless tobacco: Never Used  Vaping Use  . Vaping Use: Never used  Substance and Sexual Activity  . Alcohol use: No    Alcohol/week: 0.0 standard drinks  . Drug use: No  . Sexual activity: Not on file  Other Topics Concern  . Not on file  Social History Narrative  . Not on file   Social Determinants of Health   Financial Resource Strain: Not on file  Food Insecurity: Not on file  Transportation Needs: Not on file  Physical Activity: Not on file  Stress: Not on file  Social Connections: Not on file    Outpatient Encounter Medications as of 03/17/2021  Medication Sig  . atorvastatin (LIPITOR) 40 MG tablet TAKE 1 TABLET EVERY DAY  . diltiazem (TIAZAC) 360 MG 24 hr capsule TAKE 1 CAPSULE EVERY DAY  . EPINEPHrine (EPIPEN 2-PAK) 0.3 mg/0.3 mL IJ SOAJ injection USE AS DIRECTED  . hydrochlorothiazide (MICROZIDE) 12.5 MG capsule Take 1 capsule (12.5 mg total) by mouth daily.  Marland Kitchen levothyroxine (SYNTHROID, LEVOTHROID) 25 MCG tablet Take 25 mcg by mouth daily before breakfast.  . losartan (COZAAR) 100 MG tablet TAKE 1 TABLET EVERY DAY  . Polyvinyl Alcohol-Povidone (REFRESH OP) Apply to eye as needed.   . [DISCONTINUED] potassium  chloride (KLOR-CON) 10 MEQ tablet TAKE 1 TABLET BY MOUTH 2 TIMES DAILY.  Marland Kitchen potassium chloride (KLOR-CON) 10 MEQ tablet Take 1 tablet (10 mEq total) by mouth daily.  Marland Kitchen triamcinolone (KENALOG) 0.025 % cream Apply 1 application topically 2 (two) times daily. (Patient not taking: Reported on 03/17/2021)  . [DISCONTINUED] hydrochlorothiazide (HYDRODIURIL) 25 MG tablet Take 1 tablet (25 mg total) by mouth daily.   No facility-administered encounter medications on file as of 03/17/2021.    Review of Systems  Constitutional: Negative for appetite change and unexpected weight change.  HENT: Negative for congestion and sinus pressure.   Respiratory: Negative for cough, chest tightness and shortness  of breath.   Cardiovascular: Negative for chest pain, palpitations and leg swelling.  Gastrointestinal: Negative for abdominal pain, diarrhea, nausea and vomiting.  Genitourinary: Negative for difficulty urinating and dysuria.  Musculoskeletal: Negative for joint swelling and myalgias.  Skin: Negative for color change and rash.  Neurological: Positive for light-headedness. Negative for dizziness and headaches.       No light headedness now.  Previously noticed.   Psychiatric/Behavioral: Negative for agitation and dysphoric mood.       Objective:    Physical Exam Vitals reviewed.  Constitutional:      General: She is not in acute distress.    Appearance: Normal appearance.  HENT:     Head: Normocephalic and atraumatic.     Right Ear: External ear normal.     Left Ear: External ear normal.  Eyes:     General: No scleral icterus.       Right eye: No discharge.        Left eye: No discharge.     Conjunctiva/sclera: Conjunctivae normal.  Neck:     Thyroid: No thyromegaly.  Cardiovascular:     Rate and Rhythm: Normal rate and regular rhythm.  Pulmonary:     Effort: No respiratory distress.     Breath sounds: Normal breath sounds. No wheezing.  Abdominal:     General: Bowel sounds are normal.     Palpations: Abdomen is soft.     Tenderness: There is no abdominal tenderness.  Musculoskeletal:        General: No swelling or tenderness.     Cervical back: Neck supple. No tenderness.  Lymphadenopathy:     Cervical: No cervical adenopathy.  Skin:    Findings: No erythema or rash.  Neurological:     Mental Status: She is alert.  Psychiatric:        Mood and Affect: Mood normal.        Behavior: Behavior normal.     BP 120/64   Pulse 66   Temp 97.9 F (36.6 C)   Resp 16   Ht '5\' 3"'  (1.6 m)   Wt 141 lb (64 kg)   LMP 12/21/1976   SpO2 99%   BMI 24.98 kg/m  Wt Readings from Last 3 Encounters:  03/17/21 141 lb (64 kg)  12/14/20 141 lb (64 kg)  08/26/20 140 lb (63.5  kg)     Lab Results  Component Value Date   WBC 5.5 03/27/2020   HGB 12.1 03/27/2020   HCT 36.2 03/27/2020   PLT 198.0 03/27/2020   GLUCOSE 82 12/14/2020   CHOL 168 12/14/2020   TRIG 106.0 12/14/2020   HDL 59.80 12/14/2020   LDLCALC 87 12/14/2020   ALT 11 01/04/2021   AST 13 01/04/2021   NA 139 12/14/2020   K 3.5 02/23/2021   CL 101 12/14/2020  CREATININE 0.97 12/14/2020   BUN 13 12/14/2020   CO2 29 12/14/2020   TSH 1.66 08/10/2020   HGBA1C 6.4 12/14/2020   MICROALBUR <0.7 12/14/2020    US Abdomen Complete  Result Date: 12/26/2020 CLINICAL DATA:  Hyperbilirubinemia, abdominal fullness. EXAM: ABDOMEN ULTRASOUND COMPLETE COMPARISON:  None. FINDINGS: Gallbladder: No gallstones or wall thickening visualized. No sonographic Murphy sign noted by sonographer. Common bile duct: Diameter: 3 mm Liver: No focal lesion identified. Within normal limits in parenchymal echogenicity. Portal vein is patent on color Doppler imaging with normal direction of blood flow towards the liver. IVC: No abnormality visualized. Pancreas: Visualized portion unremarkable. Spleen: Size and appearance within normal limits. Right Kidney: Length: 10.5 cm. Echogenicity within normal limits. Multiple renal cysts large which measures 2.5 cm. No solid mass or hydronephrosis visualized. Left Kidney: Length: 11 cm. Echogenicity within normal limits. Interpolar renal cyst which measures 1.3 cm. No solid mass or hydronephrosis visualized. Abdominal aorta: No aneurysm visualized. Other findings: None. IMPRESSION: Unremarkable abdominal ultrasound Electronically Signed   By: Dahlia Bailiff MD   On: 12/26/2020 14:18       Assessment & Plan:   Problem List Items Addressed This Visit    Anemia    Follow cbc.       Relevant Orders   CBC with Differential/Platelet   Aortic atherosclerosis (HCC)    Continue lipitor.       Relevant Medications   hydrochlorothiazide (MICROZIDE) 12.5 MG capsule   Environmental allergies     Seeing an allergist.  Follow.       Essential hypertension, benign    Light headedness as outlined.  Previously noticed.  Continue losartan and diltiazem.  Will decrease hctz to 12.81m q day.  Follow pressures.  Follow metabolic panel. Decrease potassium to q day.  Follow metabolic panel.       Relevant Medications   hydrochlorothiazide (MICROZIDE) 12.5 MG capsule   Hypercholesterolemia    Continue lipitor.  Low cholesterol diet and exercise.  Follow lipid panel and liver function tests.       Relevant Medications   hydrochlorothiazide (MICROZIDE) 12.5 MG capsule   Other Relevant Orders   Hepatic function panel   Lipid panel   Type 2 diabetes mellitus with hyperglycemia (HCC)    Low carb diet and exercise due to elevated blood glucose.  Follow met b and a1c.       Relevant Orders   Hemoglobin AV3K  Basic metabolic panel       CEinar Pheasant MD

## 2021-03-19 DIAGNOSIS — E039 Hypothyroidism, unspecified: Secondary | ICD-10-CM | POA: Diagnosis not present

## 2021-03-19 DIAGNOSIS — E785 Hyperlipidemia, unspecified: Secondary | ICD-10-CM | POA: Diagnosis not present

## 2021-03-19 DIAGNOSIS — E049 Nontoxic goiter, unspecified: Secondary | ICD-10-CM | POA: Diagnosis not present

## 2021-03-19 DIAGNOSIS — E041 Nontoxic single thyroid nodule: Secondary | ICD-10-CM | POA: Diagnosis not present

## 2021-03-19 DIAGNOSIS — I10 Essential (primary) hypertension: Secondary | ICD-10-CM | POA: Diagnosis not present

## 2021-03-20 ENCOUNTER — Encounter: Payer: Self-pay | Admitting: Internal Medicine

## 2021-03-20 DIAGNOSIS — I700 Atherosclerosis of aorta: Secondary | ICD-10-CM | POA: Insufficient documentation

## 2021-03-20 NOTE — Assessment & Plan Note (Signed)
Seeing an allergist.  Follow.

## 2021-03-20 NOTE — Assessment & Plan Note (Signed)
Continue lipitor.  Low cholesterol diet and exercise.  Follow lipid panel and liver function tests.   

## 2021-03-20 NOTE — Assessment & Plan Note (Signed)
Continue lipitor  ?

## 2021-03-20 NOTE — Assessment & Plan Note (Signed)
Follow cbc.  

## 2021-03-20 NOTE — Assessment & Plan Note (Signed)
Low carb diet and exercise due to elevated blood glucose.  Follow met b and a1c.

## 2021-03-20 NOTE — Assessment & Plan Note (Addendum)
Light headedness as outlined.  Previously noticed.  Continue losartan and diltiazem.  Will decrease hctz to 12.5mg  q day.  Follow pressures.  Follow metabolic panel. Decrease potassium to q day.  Follow metabolic panel.

## 2021-04-07 ENCOUNTER — Other Ambulatory Visit (INDEPENDENT_AMBULATORY_CARE_PROVIDER_SITE_OTHER): Payer: Medicare HMO

## 2021-04-07 ENCOUNTER — Other Ambulatory Visit: Payer: Self-pay

## 2021-04-07 DIAGNOSIS — E7800 Pure hypercholesterolemia, unspecified: Secondary | ICD-10-CM

## 2021-04-07 DIAGNOSIS — D649 Anemia, unspecified: Secondary | ICD-10-CM

## 2021-04-07 DIAGNOSIS — E1165 Type 2 diabetes mellitus with hyperglycemia: Secondary | ICD-10-CM

## 2021-04-07 LAB — CBC WITH DIFFERENTIAL/PLATELET
Basophils Absolute: 0.1 10*3/uL (ref 0.0–0.1)
Basophils Relative: 1 % (ref 0.0–3.0)
Eosinophils Absolute: 0.2 10*3/uL (ref 0.0–0.7)
Eosinophils Relative: 2.9 % (ref 0.0–5.0)
HCT: 37.2 % (ref 36.0–46.0)
Hemoglobin: 12.1 g/dL (ref 12.0–15.0)
Lymphocytes Relative: 41.8 % (ref 12.0–46.0)
Lymphs Abs: 2.2 10*3/uL (ref 0.7–4.0)
MCHC: 32.6 g/dL (ref 30.0–36.0)
MCV: 86.3 fl (ref 78.0–100.0)
Monocytes Absolute: 0.4 10*3/uL (ref 0.1–1.0)
Monocytes Relative: 8.1 % (ref 3.0–12.0)
Neutro Abs: 2.4 10*3/uL (ref 1.4–7.7)
Neutrophils Relative %: 46.2 % (ref 43.0–77.0)
Platelets: 196 10*3/uL (ref 150.0–400.0)
RBC: 4.3 Mil/uL (ref 3.87–5.11)
RDW: 14.7 % (ref 11.5–15.5)
WBC: 5.3 10*3/uL (ref 4.0–10.5)

## 2021-04-07 LAB — HEMOGLOBIN A1C: Hgb A1c MFr Bld: 6.6 % — ABNORMAL HIGH (ref 4.6–6.5)

## 2021-04-07 LAB — LIPID PANEL
Cholesterol: 160 mg/dL (ref 0–200)
HDL: 55 mg/dL (ref >39.00)
LDL Cholesterol: 88 mg/dL (ref 0–99)
NonHDL: 105.39
Total CHOL/HDL Ratio: 3
Triglycerides: 86 mg/dL (ref 0.0–149.0)
VLDL: 17.2 mg/dL (ref 0.0–40.0)

## 2021-04-07 LAB — BASIC METABOLIC PANEL
BUN: 19 mg/dL (ref 6–23)
CO2: 26 mEq/L (ref 19–32)
Calcium: 9.5 mg/dL (ref 8.4–10.5)
Chloride: 101 mEq/L (ref 96–112)
Creatinine, Ser: 0.99 mg/dL (ref 0.40–1.20)
GFR: 54.85 mL/min — ABNORMAL LOW (ref >60.00)
Glucose, Bld: 91 mg/dL (ref 70–99)
Potassium: 3.5 mEq/L (ref 3.5–5.1)
Sodium: 136 mEq/L (ref 135–145)

## 2021-04-07 LAB — HEPATIC FUNCTION PANEL
ALT: 12 U/L (ref 0–35)
AST: 13 U/L (ref 0–37)
Albumin: 4.3 g/dL (ref 3.5–5.2)
Alkaline Phosphatase: 61 U/L (ref 39–117)
Bilirubin, Direct: 0.2 mg/dL (ref 0.0–0.3)
Total Bilirubin: 1 mg/dL (ref 0.2–1.2)
Total Protein: 7.4 g/dL (ref 6.0–8.3)

## 2021-04-08 ENCOUNTER — Telehealth: Payer: Self-pay

## 2021-04-08 NOTE — Telephone Encounter (Signed)
Left message to return call for lab results.

## 2021-04-09 ENCOUNTER — Encounter: Payer: Self-pay | Admitting: Internal Medicine

## 2021-04-09 ENCOUNTER — Ambulatory Visit (INDEPENDENT_AMBULATORY_CARE_PROVIDER_SITE_OTHER): Payer: Medicare HMO | Admitting: Internal Medicine

## 2021-04-09 ENCOUNTER — Other Ambulatory Visit: Payer: Self-pay

## 2021-04-09 VITALS — BP 150/60 | HR 66 | Temp 97.9°F | Ht 63.0 in | Wt 140.4 lb

## 2021-04-09 DIAGNOSIS — I10 Essential (primary) hypertension: Secondary | ICD-10-CM

## 2021-04-09 DIAGNOSIS — S70321A Blister (nonthermal), right thigh, initial encounter: Secondary | ICD-10-CM | POA: Diagnosis not present

## 2021-04-09 MED ORDER — DOXYCYCLINE HYCLATE 100 MG PO TABS: 100.0000 mg | ORAL_TABLET | Freq: Two times a day (BID) | ORAL | 0 refills | Status: DC

## 2021-04-09 MED ORDER — MUPIROCIN 2 % EX OINT: 1.0000 "application " | TOPICAL_OINTMENT | Freq: Two times a day (BID) | CUTANEOUS | 0 refills | Status: DC

## 2021-04-09 NOTE — Patient Instructions (Signed)
Blisters, Adult  A blister is a raised bubble of skin filled with liquid. Blisters often develop on skin that rubs or presses against another surface repeatedly (friction blister). Friction blisters can occur on any part of the body, but they usually form on the hands or the feet. Long-term pressure on that same area of skin can also cause the area of skin to become hardened. This hardened skin is called acallus. What are the causes? Besides friction, blisters can be caused by: An injury, such as a burn. An allergic reaction. An infection. Exposure to irritating chemicals. Friction blisters often occur in areas with a lot of heat and moisture. Friction blisters often result from: Sports. Repetitive activities. Using tools and doing other activities without wearing gloves. Shoes that are too tight or too loose. What are the signs or symptoms? A blister is often round and looks like a bump. It may hurt or feel itchy. Before a blister forms, your skin may: Turn red. Feel warm. Itch. Be painful to the touch. How is this diagnosed? A blister is diagnosed with a physical exam. How is this treated? Treatment usually involves protecting the area where the blister has formed until your skin has healed. Treatments may include: Using a bandage (dressing) to cover your blister. Putting extra padding around and over your blister so that the blister does not rub on anything. Applying antibiotic ointment. Most blisters break open, dry up, and go away on their own within 1-2 weeks. Blisters that are very painful may be drained before they break open on their own. If your blister is large or painful, your health care provider can drainit. Follow these instructions at home: Medicines Take or apply over-the-counter and prescription medicines only as told by your health care provider. If you were prescribed an antibiotic medicine, take or apply it as told by your health care provider. Do not stop using  the antibiotic even if you start to feel better. Skin care Do not pop your blister. This can cause infection. Keep your blister clean and dry. This helps to prevent infection. Before you swim or use a hot tub, cover your blister with a waterproof dressing. Protect the area where your blister has formed as told by your health care provider. Follow instructions from your health care provider about how to take care of your blister. Make sure you: Wash your hands with soap and water for at least 20 seconds before and after you change your dressing. If soap and water are not available, use hand sanitizer. Change your dressing as told by your health care provider. Infection signs Check your blister every day for signs of infection. Check for: More redness, swelling, or pain. More fluid or blood. Warmth. Pus or a bad smell. General instructions If you have a blister on a foot or toe, wear different shoes until your blister heals. Avoid the activity that caused the blister until your blister heals. Keep all follow-up visits as told by your health care provider. This is important. How is this prevented? Taking these steps can help to prevent blisters that are caused by friction. Make sure you: Wear comfortable shoes that fit well. Always wear socks with shoes. Wear extra socks or use tape, dressings, or pads over blister-prone areas as needed. You may also apply petroleum jelly under dressings in blister-prone areas. Wear protective gear, such as gloves, when taking part in sports or activities that can cause blisters. Wear loose-fitting, moisture-wicking clothes when taking part in sports or activities.   Use powders as needed to keep your feet dry. Contact a health care provider if: You have more redness, swelling, or pain around your blister. You have more fluid or blood coming from your blister. Your blister feels warm to the touch. You have pus or a bad smell coming from your blister. You  have a fever or chills. Your blister gets better and then it gets worse. Summary A blister is a raised bubble of skin filled with liquid. Blisters often develop on skin that rubs or presses against another surface repeatedly (friction blister). Most blisters break open, dry up, and go away on their own within 1-2 weeks. Keep your blister clean and dry. This helps to prevent infection. Take steps to help prevent blisters that are caused by friction. Contact a health care provider if you have signs of infection. This information is not intended to replace advice given to you by your health care provider. Make sure you discuss any questions you have with your healthcare provider. Document Revised: 09/05/2019 Document Reviewed: 09/05/2019 Elsevier Patient Education  2022 Elsevier Inc.  

## 2021-04-09 NOTE — Progress Notes (Signed)
Chief Complaint  Patient presents with   blister on back of right thigh   Acute visit  Right posterior thigh 1.5 cm blister new since Tuesday fluid filled and increasing in size and painful no h/o trauma was red area which turned into large fluid filled blister nothing tried   Htn BP elevated today on dilt 360 mg qd and hctz 12.5 mg qd losartan 100 mg qd  Rec monitor BP has f/u with PCP in 1 month but pt mentions 1 medication dose was reduced to do lower BP readings at last visit    Review of Systems  Eyes:  Negative for blurred vision.  Respiratory:  Negative for shortness of breath.   Cardiovascular:  Negative for chest pain.  Skin:        +right thigh blister  Neurological:  Negative for dizziness and headaches.  Past Medical History:  Diagnosis Date   Heart murmur    Hyperlipidemia    Hypertension    Thyroid disease    goiter (follows with Dr. Ronnald Collum)   Past Surgical History:  Procedure Laterality Date   ABDOMINAL HYSTERECTOMY  1978   ovaries left in place   [place] Right 09/29/2017   Pt had Bx  done in [facility]'s office [place] @ 3:00 per chart-"FIBROCYSTIC CHANGES".   COLONOSCOPY WITH PROPOFOL N/A 07/11/2017   Procedure: COLONOSCOPY WITH PROPOFOL;  Surgeon: Lucilla Lame, MD;  Location[facility] ENDOSCOPY;  Service: Endoscopy;  Laterality: N/A;   TUBAL LIGATION     Family History  Problem Relation Age of Onset   Stroke Mother    Hypertension Mother    Arthritis Mother    Breast cancer Sister 27   Breast cancer Sister 45   Lung cancer Brother    Diabetes Brother    Social History   Socioeconomic History   Marital status: Single    Spouse name: Not on file   Number of children: 3   Years of education: Not on file   Highest education level: Not on file  Occupational History   Not on file  Tobacco Use   Smoking status: Former    Pack years: 0.00   Smokeless tobacco: Never  Vaping Use   Vaping Use: Never used  Substance and Sexual  Activity   Alcohol use: No    Alcohol/week: 0.0 standard drinks   Drug use: No   Sexual activity: Not on file  Other Topics Concern   Not on file  Social History Narrative   Not on file   Social Determinants of Health   Financial Resource Strain: Not on file  Food Insecurity: Not on file  Transportation Needs: Not on file  Physical Activity: Not on file  Stress: Not on file  Social Connections: Not on file  Intimate Partner Violence: Not on file   Current Meds  Medication Sig   atorvastatin (LIPITOR) 40 MG tablet TAKE 1 TABLET EVERY DAY   diltiazem (TIAZAC) 360 MG 24 hr capsule TAKE 1 CAPSULE EVERY DAY   doxycycline (VIBRA-TABS) 100 MG tablet Take 1 tablet (100 mg total) by mouth 2 (two) times daily. 7-10 days with food   EPINEPHrine (EPIPEN 2-PAK) 0.3 mg/0.3 mL IJ SOAJ injection USE AS DIRECTED   hydrochlorothiazide (MICROZIDE) 12.5 MG capsule Take 1 capsule (12.5 mg total) by mouth daily.   levothyroxine (SYNTHROID, LEVOTHROID) 25 MCG tablet Take 25 mcg by mouth daily before breakfast.   losartan (COZAAR) 100 MG tablet TAKE 1 TABLET EVERY DAY  mupirocin ointment (BACTROBAN) 2 % Apply 1 application topically 2 (two) times daily.   Polyvinyl Alcohol-Povidone (REFRESH OP) Apply to eye as needed.    triamcinolone (KENALOG) 0.025 % cream Apply 1 application topically 2 (two) times daily.   Allergies  Allergen Reactions   Sulfa Antibiotics Rash   Recent Results (from the past 2160 hour(s))  Potassium     Status: None   Collection Time: 01/25/21 10:10 AM  Result Value Ref Range   Potassium 3.5 3.5 - 5.1 mEq/L  Potassium     Status: None   Collection Time: 02/23/21 10:10 AM  Result Value Ref Range   Potassium 3.5 3.5 - 5.1 mEq/L  Basic metabolic panel     Status: Abnormal   Collection Time: 04/07/21 10:01 AM  Result Value Ref Range   Sodium 136 135 - 145 mEq/L   Potassium 3.5 3.5 - 5.1 mEq/L   Chloride 101 96 - 112 mEq/L   CO2 26 19 - 32 mEq/L   Glucose, Bld 91 70 - 99  mg/dL   BUN 19 6 - 23 mg/dL   Creatinine, Ser 0.99 0.40 - 1.20 mg/dL   GFR 54.85 (L) >60.00 mL/min    Comment: Calculated using the CKD-EPI Creatinine Equation (2021)   Calcium 9.5 8.4 - 10.5 mg/dL  CBC with Differential/Platelet     Status: None   Collection Time: 04/07/21 10:01 AM  Result Value Ref Range   WBC 5.3 4.0 - 10.5 K/uL   RBC 4.30 3.87 - 5.11 Mil/uL   Hemoglobin 12.1 12.0 - 15.0 g/dL   HCT 37.2 36.0 - 46.0 %   MCV 86.3 78.0 - 100.0 fl   MCHC 32.6 30.0 - 36.0 g/dL   RDW 14.7 11.5 - 15.5 %   Platelets 196.0 150.0 - 400.0 K/uL   Neutrophils Relative % 46.2 43.0 - 77.0 %   Lymphocytes Relative 41.8 12.0 - 46.0 %   Monocytes Relative 8.1 3.0 - 12.0 %   Eosinophils Relative 2.9 0.0 - 5.0 %   Basophils Relative 1.0 0.0 - 3.0 %   Neutro Abs 2.4 1.4 - 7.7 K/uL   Lymphs Abs 2.2 0.7 - 4.0 K/uL   Monocytes Absolute 0.4 0.1 - 1.0 K/uL   Eosinophils Absolute 0.2 0.0 - 0.7 K/uL   Basophils Absolute 0.1 0.0 - 0.1 K/uL  Lipid panel     Status: None   Collection Time: 04/07/21 10:01 AM  Result Value Ref Range   Cholesterol 160 0 - 200 mg/dL    Comment: ATP III Classification       Desirable:  < 200 mg/dL               Borderline High:  200 - 239 mg/dL          High:  > = 240 mg/dL   Triglycerides 86.0 0.0 - 149.0 mg/dL    Comment: Normal:  <150 mg/dLBorderline High:  150 - 199 mg/dL   HDL 55.00 >39.00 mg/dL   VLDL 17.2 0.0 - 40.0 mg/dL   LDL Cholesterol 88 0 - 99 mg/dL   Total CHOL/HDL Ratio 3     Comment:                Men          Women1/2 Average Risk     3.4          3.3Average Risk          5.0  4.42X Average Risk          9.6          7.13X Average Risk          15.0          11.0                       NonHDL 105.39     Comment: NOTE:  Non-HDL goal should be 30 mg/dL higher than patient's LDL goal (i.e. LDL goal of < 70 mg/dL, would have non-HDL goal of < 100 mg/dL)  Hepatic function panel     Status: None   Collection Time: 04/07/21 10:01 AM  Result Value Ref  Range   Total Bilirubin 1.0 0.2 - 1.2 mg/dL   Bilirubin, Direct 0.2 0.0 - 0.3 mg/dL   Alkaline Phosphatase 61 39 - 117 U/L   AST 13 0 - 37 U/L   ALT 12 0 - 35 U/L   Total Protein 7.4 6.0 - 8.3 g/dL   Albumin 4.3 3.5 - 5.2 g/dL  Hemoglobin A1c     Status: Abnormal   Collection Time: 04/07/21 10:01 AM  Result Value Ref Range   Hgb A1c MFr Bld 6.6 (H) 4.6 - 6.5 %    Comment: Glycemic Control Guidelines for People with Diabetes:Non Diabetic:  <6%Goal of Therapy: <7%Additional Action Suggested:  >8%    Objective  Body mass index is 24.87 kg/m. Wt Readings from Last 3 Encounters:  04/09/21 140 lb 6.4 oz (63.7 kg)  03/17/21 141 lb (64 kg)  12/14/20 141 lb (64 kg)   Temp Readings from Last 3 Encounters:  04/09/21 97.9 F (36.6 C) (Oral)  03/17/21 97.9 F (36.6 C)  12/14/20 98.3 F (36.8 C) (Oral)   BP Readings from Last 3 Encounters:  04/09/21 (!) 150/60  03/17/21 120/64  12/14/20 120/60   Pulse Readings from Last 3 Encounters:  04/09/21 66  03/17/21 66  12/14/20 66    Physical Exam Vitals and nursing note reviewed.  Constitutional:      Appearance: Normal appearance. She is well-developed and well-groomed.  HENT:     Head: Normocephalic and atraumatic.  Cardiovascular:     Rate and Rhythm: Normal rate and regular rhythm.     Heart sounds: Normal heart sounds. No murmur heard. Pulmonary:     Effort: Pulmonary effort is normal.     Breath sounds: Normal breath sounds.  Skin:    General: Skin is warm and dry.       Neurological:     General: No focal deficit present.     Mental Status: She is alert and oriented to person, place, and time. Mental status is at baseline.     Gait: Gait normal.  Psychiatric:        Mood and Affect: Mood normal.        Behavior: Behavior normal. Behavior is cooperative.        Thought Content: Thought content normal.        Judgment: Judgment normal.    Assessment  Plan  Blister of right thigh, initial encounter - Plan:  mupirocin ointment (BACTROBAN) 2 %, doxycycline (VIBRA-TABS) 100 MG tablet bid  Consented and cleaned with alcohol and used sterile needle lanced blister bottom and was able to completely drain all of the fluid with topical antibiotic oint Then dressed with telfa, coban and tape pt tolerated all fluid removed    Hypertension, unspecified type  dilt 360 mg qd  and hctz 12.5 mg qd losartan 100 mg qd  Monitor BP and f/u in 1 month   Provider: Dr. Olivia Mackie McLean-Scocuzza-[facility]

## 2021-04-19 ENCOUNTER — Encounter: Payer: Self-pay | Admitting: Internal Medicine

## 2021-05-06 ENCOUNTER — Ambulatory Visit: Payer: Medicare HMO | Admitting: Internal Medicine

## 2021-06-09 ENCOUNTER — Other Ambulatory Visit: Payer: Self-pay | Admitting: Internal Medicine

## 2021-06-15 ENCOUNTER — Other Ambulatory Visit: Payer: Self-pay

## 2021-06-15 ENCOUNTER — Ambulatory Visit (INDEPENDENT_AMBULATORY_CARE_PROVIDER_SITE_OTHER): Payer: Medicare HMO | Admitting: Internal Medicine

## 2021-06-15 VITALS — BP 138/78 | HR 75 | Temp 96.9°F | Resp 16 | Ht 63.0 in | Wt 141.4 lb

## 2021-06-15 DIAGNOSIS — E876 Hypokalemia: Secondary | ICD-10-CM

## 2021-06-15 DIAGNOSIS — E1165 Type 2 diabetes mellitus with hyperglycemia: Secondary | ICD-10-CM

## 2021-06-15 DIAGNOSIS — E7800 Pure hypercholesterolemia, unspecified: Secondary | ICD-10-CM | POA: Diagnosis not present

## 2021-06-15 DIAGNOSIS — E079 Disorder of thyroid, unspecified: Secondary | ICD-10-CM | POA: Diagnosis not present

## 2021-06-15 DIAGNOSIS — N1831 Chronic kidney disease, stage 3a: Secondary | ICD-10-CM

## 2021-06-15 DIAGNOSIS — D649 Anemia, unspecified: Secondary | ICD-10-CM

## 2021-06-15 DIAGNOSIS — I10 Essential (primary) hypertension: Secondary | ICD-10-CM

## 2021-06-15 DIAGNOSIS — Z8371 Family history of colonic polyps: Secondary | ICD-10-CM

## 2021-06-15 DIAGNOSIS — I700 Atherosclerosis of aorta: Secondary | ICD-10-CM | POA: Diagnosis not present

## 2021-06-15 LAB — BASIC METABOLIC PANEL
BUN: 13 mg/dL (ref 6–23)
CO2: 27 mEq/L (ref 19–32)
Calcium: 9.7 mg/dL (ref 8.4–10.5)
Chloride: 103 mEq/L (ref 96–112)
Creatinine, Ser: 0.96 mg/dL (ref 0.40–1.20)
GFR: 56.84 mL/min — ABNORMAL LOW (ref >60.00)
Glucose, Bld: 86 mg/dL (ref 70–99)
Potassium: 3.5 mEq/L (ref 3.5–5.1)
Sodium: 140 mEq/L (ref 135–145)

## 2021-06-15 NOTE — Progress Notes (Signed)
Patient ID: Christina Cole, female   DOB: 06-18-1941, 80 y.o.   MRN: [id number]   Subjective:    Patient ID: Christina Cole, female    DOB: Oct 21, 1941, 80 y.o.   MRN: [id number]  HPI This visit occurred during the SARS-CoV-2 public health emergency.  Safety protocols were in place, including screening questions prior to the visit, additional usage of staff PPE, and extensive cleaning of exam room while observing appropriate contact time as indicated for disinfecting solutions.   Patient here for a scheduled follow up.  Here to follow up regarding her blood pressure.  Was previously noted to be running low.  HCTZ was adjusted to half dose.  Last visit her blood pressure was elevated.  Elevated again today on my initial check.  We discussed increasing the dose back up to a full tablet. Trying to stay active.   Denies any chest pain or shortness of breath.  No nausea or vomiting.  Eating.  Trying to stay hydrated.  Bowel movement every other day.  States she noticed prior to decreasing dose of the medication that she was going daily.  No significant constipation.    Past Medical History:  Diagnosis Date   Heart murmur    Hyperlipidemia    Hypertension    Thyroid disease    goiter (follows with Dr. Ronnald Collum)   Past Surgical History:  Procedure Laterality Date   ABDOMINAL HYSTERECTOMY  1978   ovaries left in place   [place] Right 09/29/2017   Pt had Bx  done in [facility]'s office [place] @ 3:00 per chart-"FIBROCYSTIC CHANGES".   COLONOSCOPY WITH PROPOFOL N/A 07/11/2017   Procedure: COLONOSCOPY WITH PROPOFOL;  Surgeon: Lucilla Lame, MD;  Location: [facility] ENDOSCOPY;  Service: Endoscopy;  Laterality: N/A;   TUBAL LIGATION     Family History  Problem Relation Age of Onset   Stroke Mother    Hypertension Mother    Arthritis Mother    Breast cancer Sister 80   Breast cancer Sister 67   Lung cancer Brother    Diabetes Brother    Social History   Socioeconomic  History   Marital status: Single    Spouse name: Not on file   Number of children: 3   Years of education: Not on file   Highest education level: Not on file  Occupational History   Not on file  Tobacco Use   Smoking status: Former   Smokeless tobacco: Never  Vaping Use   Vaping Use: Never used  Substance and Sexual Activity   Alcohol use: No    Alcohol/week: 0.0 standard drinks   Drug use: No   Sexual activity: Not on file  Other Topics Concern   Not on file  Social History Narrative   Not on file   Social Determinants of Health   Financial Resource Strain: Not on file  Food Insecurity: Not on file  Transportation Needs: Not on file  Physical Activity: Not on file  Stress: Not on file  Social Connections: Not on file    Review of Systems  Constitutional:  Negative for appetite change and unexpected weight change.  HENT:  Negative for congestion and sinus pressure.   Respiratory:  Negative for cough, chest tightness and shortness of breath.   Cardiovascular:  Negative for chest pain, palpitations and leg swelling.  Gastrointestinal:  Negative for abdominal pain, diarrhea, nausea and vomiting.  Genitourinary:  Negative for difficulty urinating and dysuria.  Musculoskeletal:  Negative  for joint swelling and myalgias.  Skin:  Negative for color change and rash.  Neurological:  Negative for dizziness, light-headedness and headaches.  Psychiatric/Behavioral:  Negative for agitation and dysphoric mood.       Objective:    Physical Exam Vitals reviewed.  Constitutional:      General: She is not in acute distress.    Appearance: Normal appearance.  HENT:     Head: Normocephalic and atraumatic.     Right Ear: External ear normal.     Left Ear: External ear normal.  Eyes:     General: No scleral icterus.       Right eye: No discharge.        Left eye: No discharge.     Conjunctiva/sclera: Conjunctivae normal.  Neck:     Thyroid: No thyromegaly.  Cardiovascular:      Rate and Rhythm: Normal rate and regular rhythm.  Pulmonary:     Effort: No respiratory distress.     Breath sounds: Normal breath sounds. No wheezing.  Abdominal:     General: Bowel sounds are normal.     Palpations: Abdomen is soft.     Tenderness: There is no abdominal tenderness.  Musculoskeletal:        General: No swelling or tenderness.     Cervical back: Neck supple. No tenderness.  Lymphadenopathy:     Cervical: No cervical adenopathy.  Skin:    Findings: No erythema or rash.  Neurological:     Mental Status: She is alert.  Psychiatric:        Mood and Affect: Mood normal.        Behavior: Behavior normal.    BP 138/78   Pulse 75   Temp (!) 96.9 F (36.1 C)   Resp 16   Ht '5\' 3"'  (1.6 m)   Wt 141 lb 6.4 oz (64.1 kg)   LMP 12/21/1976   SpO2 98%   BMI 25.05 kg/m  Wt Readings from Last 3 Encounters:  06/15/21 141 lb 6.4 oz (64.1 kg)  04/09/21 140 lb 6.4 oz (63.7 kg)  03/17/21 141 lb (64 kg)    Outpatient Encounter Medications as of 06/15/2021  Medication Sig   atorvastatin (LIPITOR) 40 MG tablet TAKE 1 TABLET EVERY DAY   diltiazem (TIAZAC) 360 MG 24 hr capsule TAKE 1 CAPSULE EVERY DAY   EPINEPHrine (EPIPEN 2-PAK) 0.3 mg/0.3 mL IJ SOAJ injection USE AS DIRECTED   hydrochlorothiazide (MICROZIDE) 12.5 MG capsule TAKE 1 CAPSULE BY MOUTH EVERY DAY   levothyroxine (SYNTHROID, LEVOTHROID) 25 MCG tablet Take 25 mcg by mouth daily before breakfast.   losartan (COZAAR) 100 MG tablet TAKE 1 TABLET EVERY DAY   Polyvinyl Alcohol-Povidone (REFRESH OP) Apply to eye as needed.    potassium chloride (KLOR-CON) 10 MEQ tablet Take 1 tablet (10 mEq total) by mouth daily. (Patient not taking: No sig reported)   triamcinolone (KENALOG) 0.025 % cream Apply 1 application topically 2 (two) times daily.   [DISCONTINUED] doxycycline (VIBRA-TABS) 100 MG tablet Take 1 tablet (100 mg total) by mouth 2 (two) times daily. 7-10 days with food (Patient not taking: Reported on 06/15/2021)    [DISCONTINUED] mupirocin ointment (BACTROBAN) 2 % Apply 1 application topically 2 (two) times daily. (Patient not taking: Reported on 06/15/2021)   No facility-administered encounter medications on file as of 06/15/2021.     Lab Results  Component Value Date   WBC 5.3 04/07/2021   HGB 12.1 04/07/2021   HCT 37.2 04/07/2021   PLT  196.0 04/07/2021   GLUCOSE 86 06/15/2021   CHOL 160 04/07/2021   TRIG 86.0 04/07/2021   HDL 55.00 04/07/2021   LDLCALC 88 04/07/2021   ALT 12 04/07/2021   AST 13 04/07/2021   NA 140 06/15/2021   K 3.5 06/15/2021   CL 103 06/15/2021   CREATININE 0.96 06/15/2021   BUN 13 06/15/2021   CO2 27 06/15/2021   TSH 1.66 08/10/2020   HGBA1C 6.6 (H) 04/07/2021   MICROALBUR <0.7 12/14/2020    US Abdomen Complete  Result Date: 12/26/2020 CLINICAL DATA:  Hyperbilirubinemia, abdominal fullness. EXAM: ABDOMEN ULTRASOUND COMPLETE COMPARISON:  None. FINDINGS: Gallbladder: No gallstones or wall thickening visualized. No sonographic Murphy sign noted by sonographer. Common bile duct: Diameter: 3 mm Liver: No focal lesion identified. Within normal limits in parenchymal echogenicity. Portal vein is patent on color Doppler imaging with normal direction of blood flow towards the liver. IVC: No abnormality visualized. Pancreas: Visualized portion unremarkable. Spleen: Size and appearance within normal limits. Right Kidney: Length: 10.5 cm. Echogenicity within normal limits. Multiple renal cysts large which measures 2.5 cm. No solid mass or hydronephrosis visualized. Left Kidney: Length: 11 cm. Echogenicity within normal limits. Interpolar renal cyst which measures 1.3 cm. No solid mass or hydronephrosis visualized. Abdominal aorta: No aneurysm visualized. Other findings: None. IMPRESSION: Unremarkable abdominal ultrasound Electronically Signed   By: Dahlia Bailiff MD   On: 12/26/2020 14:18       Assessment & Plan:   Problem List Items Addressed This Visit     Anemia    Follow cbc.        Aortic atherosclerosis (HCC)    Continue lipitor.       CKD (chronic kidney disease) stage 3, GFR 30-59 ml/min (HCC)    GFR - 54.  Stay hydrated.  Avoid antiinflammatories.  Recheck metabolic panel today.       Essential hypertension, benign - Primary    Blood pressure elevated.  Will increase hctz back up to 38m q day.  Follow pressures.  Follow metabolic panel. Check potassium to see if will need potassium supplements.        Relevant Orders   Basic metabolic panel (Completed)   Family history of polyps in the colon    Had colonoscopy 07/2017.  Recommended f/u in 5 years.        Hypercholesterolemia    Continue lipitor.  Low cholesterol diet and exercise.  Follow lipid panel and liver function tests.       Hypokalemia    Recheck potassium.  May need potassium supplement given increasing dose of hctz back up to one whole tablet per day.  Follow.       Relevant Orders   Basic metabolic panel (Completed)   Basic Metabolic Panel (BMET)   Thyroid disease    On thyroid replacement.  Follow tsh.       Type 2 diabetes mellitus with hyperglycemia (HCC)    Low carb diet and exercise given elevated blood sugars.  Follow met b and a1c.         CEinar Pheasant MD

## 2021-06-16 NOTE — Progress Notes (Signed)
Left message for patient to return call back for lab results.

## 2021-06-21 ENCOUNTER — Encounter: Payer: Self-pay | Admitting: Internal Medicine

## 2021-06-21 NOTE — Assessment & Plan Note (Signed)
GFR - 54.  Stay hydrated.  Avoid antiinflammatories.  Recheck metabolic panel today.

## 2021-06-21 NOTE — Assessment & Plan Note (Signed)
Recheck potassium.  May need potassium supplement given increasing dose of hctz back up to one whole tablet per day.  Follow.

## 2021-06-21 NOTE — Assessment & Plan Note (Signed)
Follow cbc.  

## 2021-06-21 NOTE — Assessment & Plan Note (Signed)
Low carb diet and exercise given elevated blood sugars. Follow met b and a1c.  

## 2021-06-21 NOTE — Assessment & Plan Note (Signed)
Continue lipitor  ?

## 2021-06-21 NOTE — Assessment & Plan Note (Signed)
Blood pressure elevated.  Will increase hctz back up to '25mg'$  q day.  Follow pressures.  Follow metabolic panel. Check potassium to see if will need potassium supplements.

## 2021-06-21 NOTE — Assessment & Plan Note (Signed)
Continue lipitor.  Low cholesterol diet and exercise.  Follow lipid panel and liver function tests.   

## 2021-06-21 NOTE — Assessment & Plan Note (Signed)
Had colonoscopy 07/2017.  Recommended f/u in 5 years.   

## 2021-06-21 NOTE — Assessment & Plan Note (Signed)
On thyroid replacement.  Follow tsh.  

## 2021-07-02 NOTE — Addendum Note (Signed)
Addended by: Leeanne Rio on: 07/02/2021 10:22 AM   Modules accepted: Orders

## 2021-07-06 DIAGNOSIS — H20042 Secondary noninfectious iridocyclitis, left eye: Secondary | ICD-10-CM | POA: Diagnosis not present

## 2021-07-07 ENCOUNTER — Other Ambulatory Visit: Payer: Self-pay

## 2021-07-07 ENCOUNTER — Other Ambulatory Visit (INDEPENDENT_AMBULATORY_CARE_PROVIDER_SITE_OTHER): Payer: Medicare HMO

## 2021-07-07 DIAGNOSIS — E876 Hypokalemia: Secondary | ICD-10-CM | POA: Diagnosis not present

## 2021-07-07 LAB — BASIC METABOLIC PANEL
BUN: 13 mg/dL (ref 6–23)
CO2: 26 mEq/L (ref 19–32)
Calcium: 9.4 mg/dL (ref 8.4–10.5)
Chloride: 101 mEq/L (ref 96–112)
Creatinine, Ser: 0.87 mg/dL (ref 0.40–1.20)
GFR: 63.93 mL/min (ref >60.00)
Glucose, Bld: 85 mg/dL (ref 70–99)
Potassium: 3.4 mEq/L — ABNORMAL LOW (ref 3.5–5.1)
Sodium: 138 mEq/L (ref 135–145)

## 2021-07-08 NOTE — Addendum Note (Signed)
Addended by: Elpidio Galea T on: 07/08/2021 11:07 AM   Modules accepted: Orders

## 2021-07-08 NOTE — Addendum Note (Signed)
Addended by: Elpidio Galea T on: 07/08/2021 11:04 AM   Modules accepted: Orders

## 2021-07-12 ENCOUNTER — Emergency Department
Admission: EM | Admit: 2021-07-12 | Discharge: 2021-07-12 | Disposition: A | Discharge status: Home / Self Care | Payer: Medicare HMO | Attending: Emergency Medicine | Admitting: Emergency Medicine

## 2021-07-12 ENCOUNTER — Other Ambulatory Visit: Payer: Self-pay

## 2021-07-12 ENCOUNTER — Telehealth: Payer: Self-pay | Admitting: Internal Medicine

## 2021-07-12 DIAGNOSIS — Z87891 Personal history of nicotine dependence: Secondary | ICD-10-CM | POA: Insufficient documentation

## 2021-07-12 DIAGNOSIS — N183 Chronic kidney disease, stage 3 unspecified: Secondary | ICD-10-CM | POA: Insufficient documentation

## 2021-07-12 DIAGNOSIS — Z79899 Other long term (current) drug therapy: Secondary | ICD-10-CM | POA: Diagnosis not present

## 2021-07-12 DIAGNOSIS — T7840XA Allergy, unspecified, initial encounter: Secondary | ICD-10-CM | POA: Diagnosis not present

## 2021-07-12 DIAGNOSIS — E1122 Type 2 diabetes mellitus with diabetic chronic kidney disease: Secondary | ICD-10-CM | POA: Insufficient documentation

## 2021-07-12 DIAGNOSIS — I129 Hypertensive chronic kidney disease with stage 1 through stage 4 chronic kidney disease, or unspecified chronic kidney disease: Secondary | ICD-10-CM | POA: Diagnosis not present

## 2021-07-12 DIAGNOSIS — L509 Urticaria, unspecified: Secondary | ICD-10-CM | POA: Diagnosis present

## 2021-07-12 MED ORDER — METHYLPREDNISOLONE SODIUM SUCC 125 MG IJ SOLR: 125.0000 mg | Freq: Once | INTRAMUSCULAR | Status: AC

## 2021-07-12 MED ORDER — METHYLPREDNISOLONE SODIUM SUCC 125 MG IJ SOLR
INTRAMUSCULAR | Status: AC
  Administered 2021-07-12: 125 mg via INTRAVENOUS
  Filled 2021-07-12: qty 2

## 2021-07-12 MED ORDER — DIPHENHYDRAMINE HCL 50 MG/ML IJ SOLN
INTRAMUSCULAR | Status: AC
  Administered 2021-07-12: 25 mg via INTRAVENOUS
  Filled 2021-07-12: qty 1

## 2021-07-12 MED ORDER — DIPHENHYDRAMINE HCL 50 MG/ML IJ SOLN: 25.0000 mg | Freq: Once | INTRAMUSCULAR | Status: AC

## 2021-07-12 MED ORDER — FAMOTIDINE 20 MG IN NS 100 ML IVPB
20.0000 mg | Freq: Once | INTRAVENOUS | Status: AC
  Administered 2021-07-12: 20 mg via INTRAVENOUS

## 2021-07-12 NOTE — Telephone Encounter (Signed)
Patient is calling in reference to her ER visit from this morning,please advise.

## 2021-07-12 NOTE — ED Notes (Signed)
Improvement noted to hives post medication administration

## 2021-07-12 NOTE — ED Triage Notes (Signed)
Pt with hives noted to legs. Pt states she began to have hand tingling and stomach cramping before hive onset one hour pta. Pt did not take any benadryl, no angioedema noted. Pt with redness noted to upper chest wall, hives to thighs and beginning on upper arms.

## 2021-07-12 NOTE — ED Provider Notes (Signed)
[facility] Emergency Department Provider Note  ____________________________________________  Time seen: Approximately 3:30 AM  I have reviewed the triage vital signs and the nursing notes.   HISTORY  Chief Complaint Allergic Reaction   HPI Christina Cole is a 80 y.o. female with history as listed below who presents for evaluation of an allergic reaction.  Patient reports that her symptoms started earlier this evening.  She first had some tingling in her hands and some cramping in her stomach.  Then she developed hives in her torso and legs.  This is the third episode the patient has an allergic reaction to an unknown allergen.  No new medications, soaps, detergents, or lotions.  No angioedema, no throat closing sensation, no vomiting or diarrhea, no chest pain or shortness of breath, no syncope or lightheadedness.  Patient received Solu-Medrol, Benadryl and Pepcid in triage and reports that all her symptoms have now resolved.   Past Medical History:  Diagnosis Date   Heart murmur    Hyperlipidemia    Hypertension    Thyroid disease    goiter (follows with Dr. Ronnald Collum)    Patient Active Problem List   Diagnosis Date Noted   Aortic atherosclerosis ([place]) 03/20/2021   Abdominal fullness 12/14/2020   Heart murmur 08/11/2019   Sleep difficulties 08/11/2019   Lip swelling 03/30/2019   Finger pain, left 11/21/2018   Low back pain 11/21/2018   Type 2 diabetes mellitus with hyperglycemia ([place]) 03/05/2018   Benign breast cyst in female, left 09/29/2017   Mass of upper inner quadrant of right breast 09/29/2017   Abnormal mammogram 09/26/2017   Family history of polyps in the colon    Benign neoplasm of transverse colon    Perforation of intestine ([place])    Polyp of sigmoid colon    Health care maintenance 01/11/2015   Osteoarthritis of right knee 10/08/2014   CKD (chronic kidney disease) stage 3, GFR 30-59 ml/min (HCC) 04/15/2014   Hypokalemia  06/12/2013   Anemia 06/12/2013   Essential hypertension, benign 12/30/2012   Hypercholesterolemia 12/30/2012   Thyroid disease 12/30/2012   Environmental allergies 12/30/2012    Past Surgical History:  Procedure Laterality Date   ABDOMINAL HYSTERECTOMY  1978   ovaries left in place   APPENDECTOMY  1978   BREAST BIOPSY Right 09/29/2017   Pt had Bx  done in [facility]'s office [place] @ 3:00 per chart-"FIBROCYSTIC CHANGES".   COLONOSCOPY WITH PROPOFOL N/A 07/11/2017   Procedure: COLONOSCOPY WITH PROPOFOL;  Surgeon: Lucilla Lame, MD;  Location: [facility] ENDOSCOPY;  Service: Endoscopy;  Laterality: N/A;   TUBAL LIGATION      Prior to Admission medications   Medication Sig Start Date End Date Taking? Authorizing Provider  atorvastatin (LIPITOR) 40 MG tablet TAKE 1 TABLET EVERY DAY 09/28/20   Einar Pheasant, MD  diltiazem [facility]) 360 MG 24 hr capsule TAKE 1 CAPSULE EVERY DAY 09/28/20   Einar Pheasant, MD  EPINEPHrine (EPIPEN 2-PAK) 0.3 mg/0.3 mL IJ SOAJ injection USE AS DIRECTED 08/12/20   Einar Pheasant, MD  hydrochlorothiazide (MICROZIDE) 12.5 MG capsule TAKE 1 CAPSULE BY MOUTH EVERY DAY 06/09/21   Einar Pheasant, MD  levothyroxine (SYNTHROID, LEVOTHROID) 25 MCG tablet Take 25 mcg by mouth daily before breakfast.    [provider]  losartan (COZAAR) 100 MG tablet TAKE 1 TABLET EVERY DAY 09/28/20   Einar Pheasant, MD  Polyvinyl Alcohol-Povidone ([place] OP) Apply to eye as needed.     [provider]  potassium chloride (KLOR-CON) 10  MEQ tablet Take 1 tablet (10 mEq total) by mouth daily. Patient not taking: No sig reported 03/17/21   Einar Pheasant, MD  triamcinolone (KENALOG) 0.025 % cream Apply 1 application topically 2 (two) times daily. 08/26/20   Sable Feil, PA-C    Allergies Sulfa antibiotics  Family History  Problem Relation Age of Onset   Stroke Mother    Hypertension Mother    Arthritis Mother    Breast cancer Sister 78   Breast cancer Sister 104    Lung cancer Brother    Diabetes Brother     Social History Social History   Tobacco Use   Smoking status: Former   Smokeless tobacco: Never  Scientific laboratory technician Use: Never used  Substance Use Topics   Alcohol use: No    Alcohol/week: 0.0 standard drinks   Drug use: No    Review of Systems  Constitutional: Negative for fever. Eyes: Negative for visual changes. ENT: Negative for sore throat. Neck: No neck pain  Cardiovascular: Negative for chest pain. Respiratory: Negative for shortness of breath. Gastrointestinal: Negative for abdominal pain, vomiting or diarrhea. + Abdominal cramp Genitourinary: Negative for dysuria. Musculoskeletal: Negative for back pain. Skin: + hives. Neurological: Negative for headaches, weakness or numbness. Psych: No SI or HI  ____________________________________________   PHYSICAL EXAM:  VITAL SIGNS: ED Triage Vitals  Enc Vitals Group     BP 07/12/21 0007 (!) 167/85     Pulse Rate 07/12/21 0007 77     Resp 07/12/21 0007 16     Temp 07/12/21 0007 97.7 F (36.5 C)     Temp Source 07/12/21 0007 Oral     SpO2 07/12/21 0007 98 %     Weight 07/12/21 0008 141 lb (64 kg)     Height 07/12/21 0008 '5\' 3"'$  (1.6 m)     Head Circumference --      Peak Flow --      Pain Score 07/12/21 0008 0     Pain Loc --      Pain Edu? --      Excl. in [place]? --     Constitutional: Alert and oriented. Well appearing and in no apparent distress. HEENT:      Head: Normocephalic and atraumatic.         Eyes: Conjunctivae are normal. Sclera is non-icteric.       Mouth/Throat: Mucous membranes are moist.  No angioedema, no uvular or tongue swelling, no stridor, airways patent      Neck: Supple with no signs of meningismus. Cardiovascular: Regular rate and rhythm. No murmurs, gallops, or rubs.  Respiratory: Normal respiratory effort. Lungs are clear to auscultation bilaterally.  Gastrointestinal: Soft, non tender. Musculoskeletal:  No edema, cyanosis, or erythema  of extremities. Neurologic: Normal speech and language. Face is symmetric. Moving all extremities. No gross focal neurologic deficits are appreciated. Skin: Skin is warm, dry and intact. No rash noted. Psychiatric: Mood and affect are normal. Speech and behavior are normal.  ____________________________________________   LABS (all labs ordered are listed, but only abnormal results are displayed)  Labs Reviewed - No data to display ____________________________________________  EKG  none  ____________________________________________  RADIOLOGY  none  ____________________________________________   PROCEDURES  Procedure(s) performed: None Procedures Critical Care performed:  None ____________________________________________   INITIAL IMPRESSION / ASSESSMENT AND PLAN / ED COURSE   80 y.o. female with history as listed below who presents for evaluation of an allergic reaction.  Patient presents with hives and abdominal  cramping is an allergic reaction to unknown allergen.  No signs of anaphylaxis.  She does have an EpiPen at home.  This is the third episode of an allergic reaction due to an unknown allergen.  Recommended follow-up with PCP for referral to allergist for testing.  We did discuss indications for use of the EpiPen.  Patient received Solu-Medrol, Pepcid, Benadryl here with full resolution of her symptoms.  She was monitored for 3.5 hours with no recurrence of symptoms.  Will discharge home.  Old medical records review including patient's prior visits to the ER on October 2021 for similar symptoms      _____________________________________________ Please note:  Patient was evaluated in Emergency Department today for the symptoms described in the history of present illness. Patient was evaluated in the context of the global COVID-19 pandemic, which necessitated consideration that the patient might be at risk for infection with the SARS-CoV-2 virus that causes COVID-19.  Institutional protocols and algorithms that pertain to the evaluation of patients at risk for COVID-19 are in a state of rapid change based on information released by regulatory bodies including the CDC and federal and state organizations. These policies and algorithms were followed during the patient's care in the ED.  Some ED evaluations and interventions may be delayed as a result of limited staffing during the pandemic.   [place] Controlled Substance Database was reviewed by me. ____________________________________________   FINAL CLINICAL IMPRESSION(S) / ED DIAGNOSES   Final diagnoses:  Allergic reaction, initial encounter  Hives      NEW MEDICATIONS STARTED DURING THIS VISIT:  ED Discharge Orders     None        Note:  This document was prepared using Dragon voice recognition software and may include unintentional dictation errors.    Alfred Levins, [place], MD 07/12/21 [phone]

## 2021-07-13 ENCOUNTER — Other Ambulatory Visit: Payer: Self-pay | Admitting: Internal Medicine

## 2021-07-13 ENCOUNTER — Encounter: Payer: Self-pay | Admitting: Cardiology

## 2021-07-13 ENCOUNTER — Ambulatory Visit: Payer: Medicare HMO | Admitting: Cardiology

## 2021-07-13 VITALS — BP 146/68 | HR 69 | Ht 63.0 in | Wt 139.0 lb

## 2021-07-13 DIAGNOSIS — E7800 Pure hypercholesterolemia, unspecified: Secondary | ICD-10-CM | POA: Diagnosis not present

## 2021-07-13 DIAGNOSIS — I5189 Other ill-defined heart diseases: Secondary | ICD-10-CM

## 2021-07-13 DIAGNOSIS — I10 Essential (primary) hypertension: Secondary | ICD-10-CM

## 2021-07-13 NOTE — Telephone Encounter (Signed)
Patient had allergic reaction and was seen at acute care yesterday. Recommended that she be allergy tested. Patient would like referral for allergy testing. Confirmed doing ok. Wants to stay in [place].

## 2021-07-13 NOTE — Telephone Encounter (Signed)
Patient calling back in and is needing a referral for allergy testing after being in ED.   Please advise

## 2021-07-13 NOTE — Patient Instructions (Signed)

## 2021-07-13 NOTE — Progress Notes (Signed)
Cardiology Office Note:    Date:  07/13/2021   ID:  Christina Cole, DOB 1941/02/05, MRN [id number]  PCP:  Einar Pheasant, MD  Cardiologist:  Kate Sable, MD  Electrophysiologist:  None   Referring MD: Einar Pheasant, MD   Chief Complaint  Patient presents with   Other    12 month follow up -- Meds reviewed verbally with patient.     History of Present Illness:    Christina Cole is a 80 y.o. 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    She is being seen for hypertension and diastolic dysfunction.  Taking and tolerating her BP meds.  Has no side effects.  Denies edema, chest pain or shortness of breath.  Had an allergic reaction with incident where she broke out in a rash suddenly.  Denies any new medications.  Was seen in the ED, given Benadryl, Pepcid, Solu-Medrol with improvement in symptoms.  Plans to follow-up with PCP for allergy medicine referral.  Blood pressures have been well controlled at home.  Prior notes Echo on 12/2019 showed normal systolic function, EF 60 to 123456, grade 2 diastolic dysfunction, aortic valve sclerosis.  Past Medical History:  Diagnosis Date   Heart murmur    Hyperlipidemia    Hypertension    Thyroid disease    goiter (follows with Dr. Ronnald Collum)    Past Surgical History:  Procedure Laterality Date   ABDOMINAL HYSTERECTOMY  1978   ovaries left in place   [place] Right 09/29/2017   Pt had Bx  done in [facility]'s office [place] @ 3:00 per chart-"FIBROCYSTIC CHANGES".   COLONOSCOPY WITH PROPOFOL N/A 07/11/2017   Procedure: COLONOSCOPY WITH PROPOFOL;  Surgeon: Lucilla Lame, MD;  Location[facility] ENDOSCOPY;  Service: Endoscopy;  Laterality: N/A;   TUBAL LIGATION      Current Medications: Current Meds  Medication Sig   atorvastatin (LIPITOR) 40 MG tablet TAKE 1 TABLET EVERY DAY   diltiazem (TIAZAC) 360 MG 24 hr  capsule TAKE 1 CAPSULE EVERY DAY   EPINEPHrine (EPIPEN 2-PAK) 0.3 mg/0.3 mL IJ SOAJ injection USE AS DIRECTED   hydrochlorothiazide (MICROZIDE) 12.5 MG capsule TAKE 1 CAPSULE BY MOUTH EVERY DAY   levothyroxine (SYNTHROID, LEVOTHROID) 25 MCG tablet Take 25 mcg by mouth daily before breakfast.   losartan (COZAAR) 100 MG tablet TAKE 1 TABLET EVERY DAY   Polyvinyl Alcohol-Povidone (REFRESH OP) Apply to eye as needed.    potassium chloride (KLOR-CON) 10 MEQ tablet Take 1 tablet (10 mEq total) by mouth daily.     Allergies:   Sulfa antibiotics   Social History   Socioeconomic History   Marital status: Single    Spouse name: Not on file   Number of children: 3   Years of education: Not on file   Highest education level: Not on file  Occupational History   Not on file  Tobacco Use   Smoking status: Former   Smokeless tobacco: Never  Vaping Use   Vaping Use: Never used  Substance and Sexual Activity   Alcohol use: No    Alcohol/week: 0.0 standard drinks   Drug use: No   Sexual activity: Not on file  Other Topics Concern   Not on file  Social History Narrative   Not on file   Social Determinants of Health   Financial Resource Strain: Not on file  Food Insecurity: Not on file  Transportation  Needs: Not on file  Physical Activity: Not on file  Stress: Not on file  Social Connections: Not on file     Family History: The patient's family history includes Arthritis in her mother; Breast cancer (age of onset: 24) in her sister; Breast cancer (age of onset: 94) in her sister; Diabetes in her brother; Hypertension in her mother; Lung cancer in her brother; Stroke in her mother.  ROS:   Please see the history of present illness.     All other systems reviewed and are negative.  EKGs/Labs/Other Studies Reviewed:    The following studies were reviewed today:   EKG:  EKG is  ordered today.  The ekg ordered today demonstrates sinus rhythm, first-degree AV block.  Recent  Labs: 08/10/2020: TSH 1.66 04/07/2021: ALT 12; Hemoglobin 12.1; Platelets 196.0 07/07/2021: BUN 13; Creatinine, Ser 0.87; Potassium 3.4; Sodium 138  Recent Lipid Panel    Component Value Date/Time   CHOL 160 04/07/2021 1001   TRIG 86.0 04/07/2021 1001   HDL 55.00 04/07/2021 1001   CHOLHDL 3 04/07/2021 1001   VLDL 17.2 04/07/2021 1001   LDLCALC 88 04/07/2021 1001    Physical Exam:    VS:  BP (!) 146/68 (BP Location: Left Arm, Patient Position: Sitting, Cuff Size: Normal)   Pulse 69   Ht '5\' 3"'$  (1.6 m)   Wt 139 lb (63 kg)   LMP 12/21/1976   SpO2 96%   BMI 24.62 kg/m     Wt Readings from Last 3 Encounters:  07/13/21 139 lb (63 kg)  07/12/21 141 lb (64 kg)  06/15/21 141 lb 6.4 oz (64.1 kg)     GEN:  Well nourished, well developed in no acute distress HEENT: Normal NECK: No JVD; No carotid bruits LYMPHATICS: No lymphadenopathy CARDIAC: RRR, 2/6 systolic murmur right upper sternal border.  No, rubs, no gallops RESPIRATORY:  Clear to auscultation without rales, wheezing or rhonchi  ABDOMEN: Soft, non-tender, non-distended MUSCULOSKELETAL:  No edema; No deformity  SKIN: Warm and dry NEUROLOGIC:  Alert and oriented x 3 PSYCHIATRIC:  Normal affect   ASSESSMENT:    1. Essential hypertension   2. Diastolic dysfunction   3. Pure hypercholesterolemia     PLAN:    In order of problems listed above:    Hypertension, BP adequately controlled.  Continue Cardizem, losartan, HCTZ. History of grade 2 diastolic dysfunction likely secondary to hypertension.  Patient is euvolemic.  Continue management of hypertension as above. hyperlipidemia,   Continue Lipitor.  Follow-up as needed.  Patient plans to follow-up with PCP for allergy specialist referral with recent rash.  Total encounter time 35 minutes  Greater than 50% was spent in counseling and coordination of care with the patient    Medication Adjustments/Labs and Tests Ordered: Current medicines are reviewed at length with  the patient today.  Concerns regarding medicines are outlined above.  Orders Placed This Encounter  Procedures   EKG 12-Lead    No orders of the defined types were placed in this encounter.   Patient Instructions  Medication Instructions:  Your physician recommends that you continue on your current medications as directed. Please refer to the Current Medication list given to you today.  *If you need a refill on your cardiac medications before your next appointment, please call your pharmacy*   Lab Work: None ordered If you have labs (blood work) drawn today and your tests are completely normal, you will receive your results only by: [place] (if you have MyChart) OR  A paper copy in the mail If you have any lab test that is abnormal or we need to change your treatment, we will call you to review the results.   Testing/Procedures: None ordered   Follow-Up: At [facility], you and your health needs are our priority.  As part of our continuing mission to provide you with exceptional heart care, we have created designated Provider Care Teams.  These Care Teams include your primary Cardiologist (physician) and Advanced Practice Providers (APPs -  Physician Assistants and Nurse Practitioners) who all work together to provide you with the care you need, when you need it.  We recommend signing up for the patient portal called "MyChart".  Sign up information is provided on this After Visit Summary.  MyChart is used to connect with patients for Virtual Visits (Telemedicine).  Patients are able to view lab/test results, encounter notes, upcoming appointments, etc.  Non-urgent messages can be sent to your provider as well.   To learn more about what you can do with MyChart, go to [email or web address].    Your next appointment:   Follow up as needed   The format for your next appointment:   In Person  Provider:   Kate Sable, MD   Other Instructions     Signed, Kate Sable, MD  07/13/2021 10:31 AM    [place]

## 2021-07-14 ENCOUNTER — Other Ambulatory Visit: Payer: Self-pay | Admitting: Internal Medicine

## 2021-07-14 ENCOUNTER — Telehealth: Payer: Self-pay | Admitting: Internal Medicine

## 2021-07-14 DIAGNOSIS — T7840XD Allergy, unspecified, subsequent encounter: Secondary | ICD-10-CM

## 2021-07-14 NOTE — Telephone Encounter (Signed)
Patient is calling in to let Dr.Scott know that it is not necessary for her to do the test and everything is ok.

## 2021-07-14 NOTE — Telephone Encounter (Signed)
Patient stated she is doing ok but this morning she woke up with sinus symptoms. Has home covid swab. Going to take test and then let me know results. Confirmed no chest pain, sob, fever, nausea, chills, etc.

## 2021-07-14 NOTE — Telephone Encounter (Signed)
I have placed the order for referral to allergist.  Does she need f/u appt with me.  If so, can work in.  Confirm doing ok.

## 2021-07-14 NOTE — Telephone Encounter (Signed)
I have placed order for referral. If she is feeling well and no symptoms and does not feel needs to be seen, can hold on appt, but she needs to let us know if any problems.

## 2021-07-14 NOTE — Telephone Encounter (Signed)
Patient did not feel visit was necessary unless you prefer to see her. She is not having any symptoms now. Acute care recommended allergy testing because she has had 2 allergic reactions in the past with unknown etiology. Let me know if I need to work her in.

## 2021-07-14 NOTE — Progress Notes (Signed)
Lorder placed for referral to allergist.

## 2021-07-15 NOTE — Telephone Encounter (Signed)
LMTCB

## 2021-07-29 ENCOUNTER — Other Ambulatory Visit: Payer: Self-pay

## 2021-07-29 ENCOUNTER — Other Ambulatory Visit (INDEPENDENT_AMBULATORY_CARE_PROVIDER_SITE_OTHER): Payer: Medicare HMO

## 2021-07-29 DIAGNOSIS — E876 Hypokalemia: Secondary | ICD-10-CM

## 2021-07-29 LAB — BASIC METABOLIC PANEL
BUN: 8 mg/dL (ref 6–23)
CO2: 25 mEq/L (ref 19–32)
Calcium: 9.6 mg/dL (ref 8.4–10.5)
Chloride: 99 mEq/L (ref 96–112)
Creatinine, Ser: 0.94 mg/dL (ref 0.40–1.20)
GFR: 58.24 mL/min — ABNORMAL LOW (ref >60.00)
Glucose, Bld: 85 mg/dL (ref 70–99)
Potassium: 3.5 mEq/L (ref 3.5–5.1)
Sodium: 135 mEq/L (ref 135–145)

## 2021-08-12 ENCOUNTER — Other Ambulatory Visit: Payer: Self-pay | Admitting: Internal Medicine

## 2021-08-12 DIAGNOSIS — Z1231 Encounter for screening mammogram for malignant neoplasm of breast: Secondary | ICD-10-CM

## 2021-08-20 ENCOUNTER — Other Ambulatory Visit: Payer: Self-pay | Admitting: Internal Medicine

## 2021-09-01 ENCOUNTER — Other Ambulatory Visit: Payer: Self-pay

## 2021-09-01 ENCOUNTER — Encounter: Payer: Self-pay | Admitting: Internal Medicine

## 2021-09-01 ENCOUNTER — Ambulatory Visit (INDEPENDENT_AMBULATORY_CARE_PROVIDER_SITE_OTHER): Payer: Medicare HMO | Admitting: Internal Medicine

## 2021-09-01 ENCOUNTER — Other Ambulatory Visit: Payer: Self-pay | Admitting: Internal Medicine

## 2021-09-01 VITALS — BP 144/78 | HR 60 | Temp 97.6°F | Resp 16 | Ht 63.0 in | Wt 139.5 lb

## 2021-09-01 DIAGNOSIS — E079 Disorder of thyroid, unspecified: Secondary | ICD-10-CM | POA: Diagnosis not present

## 2021-09-01 DIAGNOSIS — E7800 Pure hypercholesterolemia, unspecified: Secondary | ICD-10-CM | POA: Diagnosis not present

## 2021-09-01 DIAGNOSIS — T7840XD Allergy, unspecified, subsequent encounter: Secondary | ICD-10-CM

## 2021-09-01 DIAGNOSIS — I10 Essential (primary) hypertension: Secondary | ICD-10-CM

## 2021-09-01 DIAGNOSIS — E876 Hypokalemia: Secondary | ICD-10-CM

## 2021-09-01 DIAGNOSIS — D649 Anemia, unspecified: Secondary | ICD-10-CM

## 2021-09-01 DIAGNOSIS — I700 Atherosclerosis of aorta: Secondary | ICD-10-CM

## 2021-09-01 DIAGNOSIS — N1831 Chronic kidney disease, stage 3a: Secondary | ICD-10-CM

## 2021-09-01 DIAGNOSIS — E1165 Type 2 diabetes mellitus with hyperglycemia: Secondary | ICD-10-CM

## 2021-09-01 LAB — BASIC METABOLIC PANEL
BUN: 11 mg/dL (ref 6–23)
CO2: 30 mEq/L (ref 19–32)
Calcium: 9.4 mg/dL (ref 8.4–10.5)
Chloride: 103 mEq/L (ref 96–112)
Creatinine, Ser: 0.96 mg/dL (ref 0.40–1.20)
GFR: 56.75 mL/min — ABNORMAL LOW (ref >60.00)
Glucose, Bld: 86 mg/dL (ref 70–99)
Potassium: 3.3 mEq/L — ABNORMAL LOW (ref 3.5–5.1)
Sodium: 141 mEq/L (ref 135–145)

## 2021-09-01 MED ORDER — HYDROCHLOROTHIAZIDE 25 MG PO TABS: 25.0000 mg | ORAL_TABLET | Freq: Every day | ORAL | 1 refills | Status: DC

## 2021-09-01 NOTE — Progress Notes (Signed)
Patient ID: Christina Cole, female   DOB: 01-17-1941, 80 y.o.   MRN: [id number]   Subjective:    Patient ID: Christina Cole, female    DOB: 12-Jun-1941, 80 y.o.   MRN: [id number]  This visit occurred during the SARS-CoV-2 public health emergency.  Safety protocols were in place, including screening questions prior to the visit, additional usage of staff PPE, and extensive cleaning of exam room while observing appropriate contact time as indicated for disinfecting solutions.   Patient here for scheduled follow up.    Marland Kitchen   HPI Here to follow up regarding her cholesterol and blood pressure.  Recently evaluated in ER for allergic reaction.  Third episode - unknown allergen.  Presented with tingling hands, cramping stomach and then developed hives - torso and legs.  Received solumedrol, benadryl and pepcid.  Discussed further w/up and evaluation.  Discussed referral to an allergist.  Trying to stay active.  Evaluated by cardiology - 07/13/21.  Stable.  Breathing stable.  No increased cough or congestion.  No abdominal pain.  Bowels moving.    Past Medical History:  Diagnosis Date   Heart murmur    Hyperlipidemia    Hypertension    Thyroid disease    goiter (follows with Dr. Ronnald Collum)   Past Surgical History:  Procedure Laterality Date   ABDOMINAL HYSTERECTOMY  1978   ovaries left in place   [place] Right 09/29/2017   Pt had Bx  done in [facility] office [place] @ 3:00 per chart-"FIBROCYSTIC CHANGES".   COLONOSCOPY WITH PROPOFOL N/A 07/11/2017   Procedure: COLONOSCOPY WITH PROPOFOL;  Surgeon: Lucilla Lame, MD;  Location: [facility] ENDOSCOPY;  Service: Endoscopy;  Laterality: N/A;   TUBAL LIGATION     Family History  Problem Relation Age of Onset   Stroke Mother    Hypertension Mother    Arthritis Mother    Breast cancer Sister 64   Breast cancer Sister 53   Lung cancer Brother    Diabetes Brother    Social History   Socioeconomic History   Marital status:  Single    Spouse name: Not on file   Number of children: 3   Years of education: Not on file   Highest education level: Not on file  Occupational History   Not on file  Tobacco Use   Smoking status: Former   Smokeless tobacco: Never  Vaping Use   Vaping Use: Never used  Substance and Sexual Activity   Alcohol use: No    Alcohol/week: 0.0 standard drinks   Drug use: No   Sexual activity: Not on file  Other Topics Concern   Not on file  Social History Narrative   Not on file   Social Determinants of Health   Financial Resource Strain: Not on file  Food Insecurity: Not on file  Transportation Needs: Not on file  Physical Activity: Not on file  Stress: Not on file  Social Connections: Not on file     Review of Systems  Constitutional:  Negative for appetite change and unexpected weight change.  HENT:  Negative for congestion and sinus pressure.   Respiratory:  Negative for cough, chest tightness and shortness of breath.   Cardiovascular:  Negative for chest pain, palpitations and leg swelling.  Gastrointestinal:  Negative for abdominal pain, diarrhea, nausea and vomiting.  Genitourinary:  Negative for difficulty urinating and dysuria.  Musculoskeletal:  Negative for joint swelling and myalgias.  Skin:  Negative for color change  and rash.  Neurological:  Negative for dizziness, light-headedness and headaches.  Psychiatric/Behavioral:  Negative for agitation and dysphoric mood.       Objective:     BP (!) 144/78   Pulse 60   Temp 97.6 F (36.4 C) (Oral)   Resp 16   Ht '5\' 3"'  (1.6 m)   Wt 139 lb 8 oz (63.3 kg)   LMP 12/21/1976   SpO2 98%   BMI 24.71 kg/m  Wt Readings from Last 3 Encounters:  09/01/21 139 lb 8 oz (63.3 kg)  07/13/21 139 lb (63 kg)  07/12/21 141 lb (64 kg)    Physical Exam Vitals reviewed.  Constitutional:      General: She is not in acute distress.    Appearance: Normal appearance.  HENT:     Head: Normocephalic and atraumatic.      Right Ear: External ear normal.     Left Ear: External ear normal.  Eyes:     General: No scleral icterus.       Right eye: No discharge.        Left eye: No discharge.     Conjunctiva/sclera: Conjunctivae normal.  Neck:     Thyroid: No thyromegaly.  Cardiovascular:     Rate and Rhythm: Normal rate and regular rhythm.  Pulmonary:     Effort: No respiratory distress.     Breath sounds: Normal breath sounds. No wheezing.  Abdominal:     General: Bowel sounds are normal.     Palpations: Abdomen is soft.     Tenderness: There is no abdominal tenderness.  Musculoskeletal:        General: No swelling or tenderness.     Cervical back: Neck supple. No tenderness.  Lymphadenopathy:     Cervical: No cervical adenopathy.  Skin:    Findings: No erythema or rash.  Neurological:     Mental Status: She is alert.  Psychiatric:        Mood and Affect: Mood normal.        Behavior: Behavior normal.     Outpatient Encounter Medications as of 09/01/2021  Medication Sig   atorvastatin (LIPITOR) 40 MG tablet TAKE 1 TABLET EVERY DAY   diltiazem (TIAZAC) 360 MG 24 hr capsule TAKE 1 CAPSULE EVERY DAY   EPINEPHrine (EPIPEN 2-PAK) 0.3 mg/0.3 mL IJ SOAJ injection USE AS DIRECTED   hydrochlorothiazide (HYDRODIURIL) 25 MG tablet Take 1 tablet (25 mg total) by mouth daily.   levothyroxine (SYNTHROID, LEVOTHROID) 25 MCG tablet Take 25 mcg by mouth daily before breakfast.   losartan (COZAAR) 100 MG tablet TAKE 1 TABLET EVERY DAY   potassium chloride (KLOR-CON) 10 MEQ tablet TAKE 1 TABLET BY MOUTH TWICE A DAY   [DISCONTINUED] hydrochlorothiazide (MICROZIDE) 12.5 MG capsule TAKE 1 CAPSULE BY MOUTH EVERY DAY   [DISCONTINUED] Polyvinyl Alcohol-Povidone (REFRESH OP) Apply to eye as needed.  (Patient not taking: Reported on 09/01/2021)   [DISCONTINUED] triamcinolone (KENALOG) 0.025 % cream Apply 1 application topically 2 (two) times daily. (Patient not taking: Reported on 07/13/2021)   No facility-administered  encounter medications on file as of 09/01/2021.     Lab Results  Component Value Date   WBC 5.3 04/07/2021   HGB 12.1 04/07/2021   HCT 37.2 04/07/2021   PLT 196.0 04/07/2021   GLUCOSE 86 09/01/2021   CHOL 160 04/07/2021   TRIG 86.0 04/07/2021   HDL 55.00 04/07/2021   LDLCALC 88 04/07/2021   ALT 12 04/07/2021   AST 13 04/07/2021  NA 141 09/01/2021   K 3.3 (L) 09/01/2021   CL 103 09/01/2021   CREATININE 0.96 09/01/2021   BUN 11 09/01/2021   CO2 30 09/01/2021   TSH 1.66 08/10/2020   HGBA1C 6.6 (H) 04/07/2021   MICROALBUR <0.7 12/14/2020       Assessment & Plan:   Problem List Items Addressed This Visit     Allergic reaction    Recently evaluated in ER - third episode - as outlined.  Unclear etiology.  Refer to allergist for further evaluation.        Relevant Orders   Ambulatory referral to Allergy   Anemia    Follow cbc.       Aortic atherosclerosis (HCC)    Continue lipitor.       Relevant Medications   hydrochlorothiazide (HYDRODIURIL) 25 MG tablet   CKD (chronic kidney disease) stage 3, GFR 30-59 ml/min (HCC)    GFR - 56.   Stay hydrated.  Avoid antiinflammatories.  Recheck metabolic panel today.       Essential hypertension, benign    Blood pressure elevated.  Will increase hctz back up to 39m q day. Has been taking 12.527mq day.   Follow pressures.  Follow metabolic panel. Check potassium.       Relevant Medications   hydrochlorothiazide (HYDRODIURIL) 25 MG tablet   Other Relevant Orders   Basic metabolic panel (Completed)   Hypercholesterolemia - Primary    Continue lipitor.  Low cholesterol diet and exercise.  Follow lipid panel and liver function tests.       Relevant Medications   hydrochlorothiazide (HYDRODIURIL) 25 MG tablet   Hypokalemia    Recheck potassium.  Continue potassium supplements.        Relevant Orders   Basic metabolic panel (Completed)   Thyroid disease    On thyroid replacement.  Follow tsh.       Type 2 diabetes  mellitus with hyperglycemia (HCC)    Low carb diet and exercise given elevated blood sugars.  Follow met b and a1c.         ChEinar PheasantMD

## 2021-09-01 NOTE — Progress Notes (Signed)
Order placed for f/u labs.  

## 2021-09-07 ENCOUNTER — Encounter: Payer: Self-pay | Admitting: Internal Medicine

## 2021-09-07 DIAGNOSIS — T7840XA Allergy, unspecified, initial encounter: Secondary | ICD-10-CM | POA: Insufficient documentation

## 2021-09-07 NOTE — Assessment & Plan Note (Addendum)
Blood pressure elevated.  Will increase hctz back up to 25mg  q day. Has been taking 12.5mg  q day.   Follow pressures.  Follow metabolic panel. Check potassium.

## 2021-09-07 NOTE — Assessment & Plan Note (Signed)
On thyroid replacement.  Follow tsh.  

## 2021-09-07 NOTE — Assessment & Plan Note (Signed)
Recently evaluated in ER - third episode - as outlined.  Unclear etiology.  Refer to allergist for further evaluation.

## 2021-09-07 NOTE — Assessment & Plan Note (Signed)
Low carb diet and exercise given elevated blood sugars. Follow met b and a1c.  

## 2021-09-07 NOTE — Assessment & Plan Note (Signed)
Continue lipitor  ?

## 2021-09-07 NOTE — Assessment & Plan Note (Signed)
GFR - 56.   Stay hydrated.  Avoid antiinflammatories.  Recheck metabolic panel today.

## 2021-09-07 NOTE — Assessment & Plan Note (Signed)
Recheck potassium.  Continue potassium supplements.

## 2021-09-07 NOTE — Assessment & Plan Note (Signed)
Follow cbc.  

## 2021-09-07 NOTE — Assessment & Plan Note (Signed)
Continue lipitor.  Low cholesterol diet and exercise.  Follow lipid panel and liver function tests.   

## 2021-09-10 ENCOUNTER — Other Ambulatory Visit: Payer: Self-pay

## 2021-09-10 ENCOUNTER — Ambulatory Visit
Admission: RE | Admit: 2021-09-10 | Discharge: 2021-09-10 | Disposition: A | Discharge status: Home / Self Care | Payer: Medicare HMO | Source: Ambulatory Visit | Attending: Internal Medicine | Admitting: Internal Medicine

## 2021-09-10 DIAGNOSIS — Z1231 Encounter for screening mammogram for malignant neoplasm of breast: Secondary | ICD-10-CM | POA: Diagnosis not present

## 2021-09-13 ENCOUNTER — Other Ambulatory Visit: Payer: Medicare HMO

## 2021-09-13 ENCOUNTER — Other Ambulatory Visit: Payer: Self-pay

## 2021-09-13 NOTE — Addendum Note (Signed)
Addended by: Leeanne Rio on: 09/13/2021 11:38 AM   Modules accepted: Orders

## 2021-09-28 ENCOUNTER — Other Ambulatory Visit: Payer: Self-pay

## 2021-09-28 ENCOUNTER — Other Ambulatory Visit (INDEPENDENT_AMBULATORY_CARE_PROVIDER_SITE_OTHER): Payer: Medicare HMO

## 2021-09-28 DIAGNOSIS — E1165 Type 2 diabetes mellitus with hyperglycemia: Secondary | ICD-10-CM

## 2021-09-28 DIAGNOSIS — E876 Hypokalemia: Secondary | ICD-10-CM

## 2021-09-28 DIAGNOSIS — E7800 Pure hypercholesterolemia, unspecified: Secondary | ICD-10-CM | POA: Diagnosis not present

## 2021-09-28 LAB — LIPID PANEL
Cholesterol: 172 mg/dL (ref 0–200)
HDL: 55.1 mg/dL (ref >39.00)
LDL Cholesterol: 92 mg/dL (ref 0–99)
NonHDL: 117.27
Total CHOL/HDL Ratio: 3
Triglycerides: 124 mg/dL (ref 0.0–149.0)
VLDL: 24.8 mg/dL (ref 0.0–40.0)

## 2021-09-28 LAB — BASIC METABOLIC PANEL
BUN: 12 mg/dL (ref 6–23)
CO2: 27 mEq/L (ref 19–32)
Calcium: 9.6 mg/dL (ref 8.4–10.5)
Chloride: 99 mEq/L (ref 96–112)
Creatinine, Ser: 0.88 mg/dL (ref 0.40–1.20)
GFR: 62.96 mL/min (ref >60.00)
Glucose, Bld: 88 mg/dL (ref 70–99)
Potassium: 3.6 mEq/L (ref 3.5–5.1)
Sodium: 135 mEq/L (ref 135–145)

## 2021-09-28 LAB — HEPATIC FUNCTION PANEL
ALT: 16 U/L (ref 0–35)
AST: 15 U/L (ref 0–37)
Albumin: 4.3 g/dL (ref 3.5–5.2)
Alkaline Phosphatase: 52 U/L (ref 39–117)
Bilirubin, Direct: 0.2 mg/dL (ref 0.0–0.3)
Total Bilirubin: 1.2 mg/dL (ref 0.2–1.2)
Total Protein: 7 g/dL (ref 6.0–8.3)

## 2021-09-28 LAB — HEMOGLOBIN A1C: Hgb A1c MFr Bld: 6.4 % (ref 4.6–6.5)

## 2021-09-28 LAB — TSH: TSH: 1.08 u[IU]/mL (ref 0.35–5.50)

## 2021-09-29 ENCOUNTER — Other Ambulatory Visit: Payer: Self-pay | Admitting: Internal Medicine

## 2021-09-29 ENCOUNTER — Telehealth: Payer: Self-pay | Admitting: *Deleted

## 2021-09-29 NOTE — Telephone Encounter (Signed)
-----   Message from Einar Pheasant, MD sent at 09/29/2021  4:44 AM EST ----- Notify - cholesterol levels are stable and ok.  Overall sugar control improved from last check.  A1c 6.4.  continue low carb diet and exercise.  We will continue to follow.  Kidney function ok.  Thyroid test and liver function tests are wnl.

## 2021-09-29 NOTE — Telephone Encounter (Signed)
LMTCB

## 2021-09-29 NOTE — Telephone Encounter (Signed)
Lab results given the patient voiced understanding.

## 2021-10-10 ENCOUNTER — Other Ambulatory Visit: Payer: Self-pay | Admitting: Internal Medicine

## 2021-10-27 ENCOUNTER — Encounter: Payer: Self-pay | Admitting: Internal Medicine

## 2021-10-27 ENCOUNTER — Ambulatory Visit (INDEPENDENT_AMBULATORY_CARE_PROVIDER_SITE_OTHER): Payer: Medicare HMO | Admitting: Internal Medicine

## 2021-10-27 ENCOUNTER — Other Ambulatory Visit: Payer: Self-pay

## 2021-10-27 VITALS — BP 138/72 | HR 69 | Ht 62.99 in | Wt 138.2 lb

## 2021-10-27 DIAGNOSIS — E1165 Type 2 diabetes mellitus with hyperglycemia: Secondary | ICD-10-CM | POA: Diagnosis not present

## 2021-10-27 DIAGNOSIS — T7840XD Allergy, unspecified, subsequent encounter: Secondary | ICD-10-CM | POA: Diagnosis not present

## 2021-10-27 DIAGNOSIS — I700 Atherosclerosis of aorta: Secondary | ICD-10-CM

## 2021-10-27 DIAGNOSIS — N1831 Chronic kidney disease, stage 3a: Secondary | ICD-10-CM

## 2021-10-27 DIAGNOSIS — R928 Other abnormal and inconclusive findings on diagnostic imaging of breast: Secondary | ICD-10-CM

## 2021-10-27 DIAGNOSIS — E079 Disorder of thyroid, unspecified: Secondary | ICD-10-CM | POA: Diagnosis not present

## 2021-10-27 DIAGNOSIS — D649 Anemia, unspecified: Secondary | ICD-10-CM

## 2021-10-27 DIAGNOSIS — E876 Hypokalemia: Secondary | ICD-10-CM

## 2021-10-27 DIAGNOSIS — I10 Essential (primary) hypertension: Secondary | ICD-10-CM | POA: Diagnosis not present

## 2021-10-27 DIAGNOSIS — E7800 Pure hypercholesterolemia, unspecified: Secondary | ICD-10-CM | POA: Diagnosis not present

## 2021-10-27 NOTE — Progress Notes (Signed)
Patient ID: Christina Cole, female   DOB: 01/07/1941, 80 y.o.   MRN: [id number] ° ° °Subjective:  ° ° Patient ID: Christina Cole, female    DOB: 09/01/1941, 80 y.o.   MRN: [id number] ° °This visit occurred during the SARS-CoV-2 public health emergency.  Safety protocols were in place, including screening questions prior to the visit, additional usage of staff PPE, and extensive cleaning of exam room while observing appropriate contact time as indicated for disinfecting solutions.  ° °Patient here for a scheduled follow up.  ° °Chief Complaint  °Patient presents with  ° Hypertension  ° Follow-up  ° .  ° °HPI °Also reports increased stress - sister passed, brother sick.  Discussed.  Overall she feels she is handling things relatively well.  Does not feel needs any further intervention.  No chest pain or sob reported.  Previous allergic reaction as outlined last note - tingling hands, stomach cramping and hives - requiring ER evaluation.  Third episode.  Unclear etiology.  Has appt with allergist 11/29/21.  Eating.  No nausea or vomiting.  Bowels moving.  Taking hctz and losartan.   ° ° °Past Medical History:  °Diagnosis Date  ° Heart murmur   ° Hyperlipidemia   ° Hypertension   ° Thyroid disease   ° goiter (follows with Dr. Morayati)  ° °Past Surgical History:  °Procedure Laterality Date  ° ABDOMINAL HYSTERECTOMY  1978  ° ovaries left in place  ° APPENDECTOMY  1978  ° BREAST BIOPSY Right 09/29/2017  ° Pt had Bx  done in [facility]'s office [facility] @ 3:00 per chart-"FIBROCYSTIC CHANGES".  ° COLONOSCOPY WITH PROPOFOL N/A 07/11/2017  ° Procedure: COLONOSCOPY WITH PROPOFOL;  Surgeon: Wohl, Darren, MD;  Location: [facility] ENDOSCOPY;  Service: Endoscopy;  Laterality: N/A;  ° TUBAL LIGATION    ° °Family History  °Problem Relation Age of Onset  ° Stroke Mother   ° Hypertension Mother   ° Arthritis Mother   ° Breast cancer Sister 55  ° Breast cancer Sister 65  ° Lung cancer Brother   ° Diabetes Brother   ° °Social History   ° °Socioeconomic History  ° Marital status: Single  °  Spouse name: Not on file  ° Number of children: 3  ° Years of education: Not on file  ° Highest education level: Not on file  °Occupational History  ° Not on file  °Tobacco Use  ° Smoking status: Former  ° Smokeless tobacco: Never  °Vaping Use  ° Vaping Use: Never used  °Substance and Sexual Activity  ° Alcohol use: No  °  Alcohol/week: 0.0 standard drinks  ° Drug use: No  ° Sexual activity: Not on file  °Other Topics Concern  ° Not on file  °Social History Narrative  ° Not on file  ° °Social Determinants of Health  ° °Financial Resource Strain: Not on file  °Food Insecurity: Not on file  °Transportation Needs: Not on file  °Physical Activity: Not on file  °Stress: Not on file  °Social Connections: Not on file  ° ° °Review of Systems  °Constitutional:  Negative for appetite change and unexpected weight change.  °HENT:  Negative for congestion and sinus pressure.   °Respiratory:  Negative for cough, chest tightness and shortness of breath.   °Cardiovascular:  Negative for chest pain, palpitations and leg swelling.  °Gastrointestinal:  Negative for abdominal pain, diarrhea, nausea and vomiting.  °Genitourinary:  Negative for difficulty urinating and dysuria.  °Musculoskeletal:  Negative for joint   swelling and myalgias.  Skin:  Negative for color change and rash.  Neurological:  Negative for dizziness, light-headedness and headaches.  Psychiatric/Behavioral:  Negative for agitation and dysphoric mood.        Increased stress as outlined.        Objective:     BP 138/72    Pulse 69    Ht 5' 2.99" (1.6 m)    Wt 138 lb 3.2 oz (62.7 kg)    LMP 12/21/1976    SpO2 98%    BMI 24.49 kg/m  Wt Readings from Last 3 Encounters:  10/27/21 138 lb 3.2 oz (62.7 kg)  09/01/21 139 lb 8 oz (63.3 kg)  07/13/21 139 lb (63 kg)    Physical Exam Vitals reviewed.  Constitutional:      General: She is not in acute distress.    Appearance: Normal appearance.  HENT:      Head: Normocephalic and atraumatic.     Right Ear: External ear normal.     Left Ear: External ear normal.  Eyes:     General: No scleral icterus.       Right eye: No discharge.        Left eye: No discharge.     Conjunctiva/sclera: Conjunctivae normal.  Neck:     Thyroid: No thyromegaly.  Cardiovascular:     Rate and Rhythm: Normal rate and regular rhythm.  Pulmonary:     Effort: No respiratory distress.     Breath sounds: Normal breath sounds. No wheezing.  Abdominal:     General: Bowel sounds are normal.     Palpations: Abdomen is soft.     Tenderness: There is no abdominal tenderness.  Musculoskeletal:        General: No swelling or tenderness.     Cervical back: Neck supple. No tenderness.  Lymphadenopathy:     Cervical: No cervical adenopathy.  Skin:    Findings: No erythema or rash.  Neurological:     Mental Status: She is alert.  Psychiatric:        Mood and Affect: Mood normal.        Behavior: Behavior normal.     Outpatient Encounter Medications as of 10/27/2021  Medication Sig   atorvastatin (LIPITOR) 40 MG tablet TAKE 1 TABLET EVERY DAY   EPINEPHrine (EPIPEN 2-PAK) 0.3 mg/0.3 mL IJ SOAJ injection USE AS DIRECTED   hydrochlorothiazide (HYDRODIURIL) 25 MG tablet Take 1 tablet (25 mg total) by mouth daily.   levothyroxine (SYNTHROID, LEVOTHROID) 25 MCG tablet Take 25 mcg by mouth daily before breakfast.   losartan (COZAAR) 100 MG tablet TAKE 1 TABLET EVERY DAY   potassium chloride (KLOR-CON) 10 MEQ tablet TAKE 1 TABLET BY MOUTH TWICE A DAY   TIADYLT ER 360 MG 24 hr capsule TAKE 1 CAPSULE EVERY DAY   No facility-administered encounter medications on file as of 10/27/2021.     Lab Results  Component Value Date   WBC 5.3 04/07/2021   HGB 12.1 04/07/2021   HCT 37.2 04/07/2021   PLT 196.0 04/07/2021   GLUCOSE 88 09/28/2021   CHOL 172 09/28/2021   TRIG 124.0 09/28/2021   HDL 55.10 09/28/2021   LDLCALC 92 09/28/2021   ALT 16 09/28/2021   AST 15  09/28/2021   NA 135 09/28/2021   K 3.6 09/28/2021   CL 99 09/28/2021   CREATININE 0.88 09/28/2021   BUN 12 09/28/2021   CO2 27 09/28/2021   TSH 1.08 09/28/2021   HGBA1C 6.4 09/28/2021  MICROALBUR <0.7 12/14/2020    MM 3D SCREEN BREAST BILATERAL  Result Date: 09/10/2021 CLINICAL DATA:  Screening. EXAM: DIGITAL SCREENING BILATERAL MAMMOGRAM WITH TOMOSYNTHESIS AND CAD TECHNIQUE: Bilateral screening digital craniocaudal and mediolateral oblique mammograms were obtained. Bilateral screening digital breast tomosynthesis was performed. The images were evaluated with computer-aided detection. COMPARISON:  Previous exam(s). ACR Breast Density Category c: The breast tissue is heterogeneously dense, which may obscure small masses. FINDINGS: There are no findings suspicious for malignancy. IMPRESSION: No mammographic evidence of malignancy. A result letter of this screening mammogram will be mailed directly to the patient. RECOMMENDATION: Screening mammogram in one year. (Code:SM-B-01Y) BI-RADS CATEGORY  1: Negative. Electronically Signed   By: Valentino Saxon M.D.   On: 09/10/2021 16:51      Assessment & Plan:   Problem List Items Addressed This Visit     Abnormal mammogram    S/p biopsy - Dr Bary Castilla - ok.  Recommended continuing yearly screening mammogram.  Mammogram 09/10/21 - Birads I.       Allergic reaction    Recently evaluated in ER - third episode - as outlined.  Unclear etiology.  appt with allergist scheduled for 11/29/21.        Anemia    Follow cbc.       Aortic atherosclerosis (HCC)    Continue lipitor.  Low cholesterol diet and exercise.  Follow lipid panel and liver function tests.       CKD (chronic kidney disease) stage 3, GFR 30-59 ml/min (HCC)    GFR - 56.   Stay hydrated.  Avoid antiinflammatories.  Follow metabolic panel.       Essential hypertension, benign    Blood pressure as outlined.  Continue losartan and hctz.  Follow pressures.  Follow metabolic panel.        Hypercholesterolemia    Continue lipitor.  Low cholesterol diet and exercise.  Follow lipid panel and liver function tests.       Relevant Orders   Basic metabolic panel   Hepatic function panel   Lipid panel   Hypokalemia    Recent potassium check wnl.  On potassium supplements.        Thyroid disease    On thyroid replacement.  Follow tsh.       Type 2 diabetes mellitus with hyperglycemia (HCC) - Primary    Low carb diet and exercise given elevated blood sugars.  Follow met b and a1c.       Relevant Orders   Hemoglobin A1c     Einar Pheasant, MD

## 2021-10-28 DIAGNOSIS — H16142 Punctate keratitis, left eye: Secondary | ICD-10-CM | POA: Diagnosis not present

## 2021-11-02 ENCOUNTER — Encounter: Payer: Self-pay | Admitting: Internal Medicine

## 2021-11-02 NOTE — Assessment & Plan Note (Signed)
Continue lipitor.  Low cholesterol diet and exercise.  Follow lipid panel and liver function tests.   

## 2021-11-02 NOTE — Assessment & Plan Note (Signed)
Recently evaluated in ER - third episode - as outlined.  Unclear etiology.  appt with allergist scheduled for 11/29/21.

## 2021-11-02 NOTE — Assessment & Plan Note (Signed)
GFR - 56.   Stay hydrated.  Avoid antiinflammatories.  Follow metabolic panel.

## 2021-11-02 NOTE — Assessment & Plan Note (Signed)
Follow cbc.  

## 2021-11-02 NOTE — Assessment & Plan Note (Signed)
Recent potassium check wnl.  On potassium supplements.

## 2021-11-02 NOTE — Assessment & Plan Note (Signed)
Blood pressure as outlined.  Continue losartan and hctz.  Follow pressures.  Follow metabolic panel.

## 2021-11-02 NOTE — Assessment & Plan Note (Signed)
Low carb diet and exercise given elevated blood sugars.  Follow met b and a1c.

## 2021-11-02 NOTE — Assessment & Plan Note (Signed)
On thyroid replacement.  Follow tsh.  

## 2021-11-02 NOTE — Assessment & Plan Note (Signed)
S/p biopsy - Dr Bary Castilla - ok.  Recommended continuing yearly screening mammogram.  Mammogram 09/10/21 - Birads I.

## 2021-11-14 ENCOUNTER — Other Ambulatory Visit: Payer: Self-pay | Admitting: Internal Medicine

## 2021-11-29 ENCOUNTER — Ambulatory Visit: Payer: Medicare HMO | Admitting: Allergy

## 2021-12-06 ENCOUNTER — Telehealth (INDEPENDENT_AMBULATORY_CARE_PROVIDER_SITE_OTHER): Payer: Medicare HMO | Admitting: Family

## 2021-12-06 ENCOUNTER — Other Ambulatory Visit: Payer: Self-pay

## 2021-12-06 VITALS — BP 142/68 | HR 73 | Wt 145.0 lb

## 2021-12-06 DIAGNOSIS — J0110 Acute frontal sinusitis, unspecified: Secondary | ICD-10-CM | POA: Diagnosis not present

## 2021-12-06 MED ORDER — AMOXICILLIN-POT CLAVULANATE 875-125 MG PO TABS: 1.0000 | ORAL_TABLET | Freq: Two times a day (BID) | ORAL | 0 refills | Status: DC

## 2021-12-06 NOTE — Progress Notes (Signed)
Virtual telephone visit    Virtual Visit via Telephone Note   This visit type was conducted due to national recommendations for restrictions regarding the COVID-19 Pandemic (e.g. social distancing) in an effort to limit this patient's exposure and mitigate transmission in our community. Due to her co-morbid illnesses, this patient is at least at moderate risk for complications without adequate follow up. This format is felt to be most appropriate for this patient at this time. The patient did not have access to video technology or had technical difficulties with video requiring transitioning to audio format only (telephone). Physical exam was limited to content and character of the telephone converstion. CMA was able to get the patient set up on a telephone visit.   Patient location: Home. Patient and provider in visit Provider location: Office  I discussed the limitations of evaluation and management by telemedicine and the availability of in person appointments. The patient expressed understanding and agreed to proceed.   Visit Date: 12/06/2021  Today's healthcare provider: Eugenia Pancoast, FNP     Subjective:    Patient ID: Christina Cole, female    DOB: 07/07/1941, 81 y.o.   MRN: [id number]  Chief Complaint  Patient presents with   Sinusitis    X 5 days    Cough    Just at night when her throat gets dry.     Sinusitis Associated symptoms include congestion, coughing (dry non productive) and ear pain (bil ear dryness/pain). Pertinent negatives include no chills, shortness of breath or sore throat.  Cough Associated symptoms include ear pain (bil ear dryness/pain). Pertinent negatives include no chest pain, chills, fever, sore throat, shortness of breath or wheezing.   81 y/o female via telephone with concerns.  C/o cough, dry non productive, mostly in the am. C/o sinus pressure, nasal congestion, and bil ear dryness/pain. No sore throat. No fever and or chills. No chest  congestion. Now starting to lose her voice.     Past Medical History:  Diagnosis Date   Heart murmur    Hyperlipidemia    Hypertension    Thyroid disease    goiter (follows with Dr. Ronnald Collum)    Past Surgical History:  Procedure Laterality Date   ABDOMINAL HYSTERECTOMY  1978   ovaries left in place   [place] Right 09/29/2017   Pt had Bx  done in [facility] office [facility] @ 3:00 per chart-"FIBROCYSTIC CHANGES".   COLONOSCOPY WITH PROPOFOL N/A 07/11/2017   Procedure: COLONOSCOPY WITH PROPOFOL;  Surgeon: Lucilla Lame, MD;  Location: [facility]COPY;  Service: Endoscopy;  Laterality: N/A;   TUBAL LIGATION      Family History  Problem Relation Age of Onset   Stroke Mother    Hypertension Mother    Arthritis Mother    Breast cancer Sister 73   Breast cancer Sister 11   Lung cancer Brother    Diabetes Brother     Social History   Socioeconomic History   Marital status: Single    Spouse name: Not on file   Number of children: 3   Years of education: Not on file   Highest education level: Not on file  Occupational History   Not on file  Tobacco Use   Smoking status: Former   Smokeless tobacco: Never  Vaping Use   Vaping Use: Never used  Substance and Sexual Activity   Alcohol use: No    Alcohol/week: 0.0 standard drinks   Drug use: No  Sexual activity: Not on file  Other Topics Concern   Not on file  Social History Narrative   Not on file   Social Determinants of Health   Financial Resource Strain: Not on file  Food Insecurity: Not on file  Transportation Needs: Not on file  Physical Activity: Not on file  Stress: Not on file  Social Connections: Not on file  Intimate Partner Violence: Not on file    Outpatient Medications Prior to Visit  Medication Sig Dispense Refill   atorvastatin (LIPITOR) 40 MG tablet TAKE 1 TABLET EVERY DAY 90 tablet 3   EPINEPHrine (EPIPEN 2-PAK) 0.3 mg/0.3 mL IJ SOAJ injection USE AS DIRECTED 2 each 0    hydrochlorothiazide (HYDRODIURIL) 25 MG tablet Take 1 tablet (25 mg total) by mouth daily. 90 tablet 1   levothyroxine (SYNTHROID, LEVOTHROID) 25 MCG tablet Take 25 mcg by mouth daily before breakfast.     losartan (COZAAR) 100 MG tablet TAKE 1 TABLET EVERY DAY 90 tablet 3   potassium chloride (KLOR-CON) 10 MEQ tablet TAKE 1 TABLET BY MOUTH TWICE A DAY 60 tablet 0   TIADYLT ER 360 MG 24 hr capsule TAKE 1 CAPSULE EVERY DAY 90 capsule 3   No facility-administered medications prior to visit.    Allergies  Allergen Reactions   Sulfa Antibiotics Rash    Review of Systems  Constitutional:  Negative for chills and fever.  HENT:  Positive for congestion and ear pain (bil ear dryness/pain). Negative for sore throat.   Respiratory:  Positive for cough (dry non productive). Negative for shortness of breath and wheezing.   Cardiovascular:  Negative for chest pain.  All other systems reviewed and are negative.     Objective:    Physical Exam Vitals reviewed.  Neurological:     General: No focal deficit present.     Mental Status: She is oriented to person, place, and time.  Psychiatric:        Mood and Affect: Mood normal.        Behavior: Behavior normal.        Thought Content: Thought content normal.        Judgment: Judgment normal.    BP (!) 142/68    Pulse 73    Wt 145 lb (65.8 kg)    LMP 12/21/1976    BMI 25.69 kg/m  Wt Readings from Last 3 Encounters:  12/06/21 145 lb (65.8 kg)  10/27/21 138 lb 3.2 oz (62.7 kg)  09/01/21 139 lb 8 oz (63.3 kg)        Assessment & Plan:   Problem List Items Addressed This Visit       Respiratory   Acute frontal sinusitis - Primary    Prescription given for augmentin 875/125 mg po bid for ten days. Pt to continue tylenol/ibuprofen prn sinus pain. Continue with humidifier prn and steam showers recommended as well. instructed If no symptom improvement in 48 hours please f/u       Relevant Medications   amoxicillin-clavulanate  (AUGMENTIN) 875-125 MG tablet    I am having Gem L. Lori start on amoxicillin-clavulanate. I am also having her maintain her levothyroxine, EPINEPHrine, hydrochlorothiazide, atorvastatin, losartan, Tiadylt ER, and potassium chloride.  Meds ordered this encounter  Medications   amoxicillin-clavulanate (AUGMENTIN) 875-125 MG tablet    Sig: Take 1 tablet by mouth 2 (two) times daily.    Dispense:  20 tablet    Refill:  0    Order Specific Question:   Supervising Provider  Answer:   BEDSOLE, AMY E [[id number]]     I discussed the assessment and treatment plan with the patient. The patient was provided an opportunity to ask questions and all were answered. The patient agreed with the plan and demonstrated an understanding of the instructions.   The patient was advised to call back or seek an in-person evaluation if the symptoms worsen or if the condition fails to improve as anticipated.  I provided 18 minutes of non-face-to-face time during this encounter.   Eugenia Pancoast, [place] [facility] [place] [phone] (phone) [phone] (fax)  [place]

## 2021-12-06 NOTE — Assessment & Plan Note (Signed)
Prescription given for augmentin 875/125 mg po bid for ten days. Pt to continue tylenol/ibuprofen prn sinus pain. Continue with humidifier prn and steam showers recommended as well. instructed If no symptom improvement in 48 hours please f/u ? ?

## 2021-12-06 NOTE — Patient Instructions (Signed)
Antibiotic sent to preferred pharmacy.   Please increase oral fluids, steamy hot shower/humidifier prn.  Please follow up if no improvement in 2-3 days.   It was a pleasure seeing you today! Please do not hesitate to reach out with any questions and or concerns.  Regards,   Terrence Wishon   

## 2021-12-23 ENCOUNTER — Other Ambulatory Visit: Payer: Self-pay | Admitting: Internal Medicine

## 2021-12-23 DIAGNOSIS — H16142 Punctate keratitis, left eye: Secondary | ICD-10-CM | POA: Diagnosis not present

## 2022-01-12 ENCOUNTER — Encounter: Payer: Self-pay | Admitting: Internal Medicine

## 2022-01-12 ENCOUNTER — Ambulatory Visit (INDEPENDENT_AMBULATORY_CARE_PROVIDER_SITE_OTHER): Payer: Medicare HMO | Admitting: Internal Medicine

## 2022-01-12 ENCOUNTER — Other Ambulatory Visit: Payer: Self-pay

## 2022-01-12 VITALS — BP 124/68 | HR 70 | Temp 97.6°F | Ht 62.99 in | Wt 138.2 lb

## 2022-01-12 DIAGNOSIS — L509 Urticaria, unspecified: Secondary | ICD-10-CM | POA: Diagnosis not present

## 2022-01-12 DIAGNOSIS — L989 Disorder of the skin and subcutaneous tissue, unspecified: Secondary | ICD-10-CM

## 2022-01-12 DIAGNOSIS — F4321 Adjustment disorder with depressed mood: Secondary | ICD-10-CM | POA: Diagnosis not present

## 2022-01-12 MED ORDER — TRIAMCINOLONE ACETONIDE 0.1 % EX CREA: 1.0000 "application " | TOPICAL_CREAM | Freq: Two times a day (BID) | CUTANEOUS | 0 refills | Status: DC

## 2022-01-12 MED ORDER — HYDROXYZINE HCL 25 MG PO TABS: 12.5000 mg | ORAL_TABLET | Freq: Three times a day (TID) | ORAL | 0 refills | Status: DC | PRN

## 2022-01-12 NOTE — Patient Instructions (Addendum)
Call [facility] for appt for hives asap ?Consider PATCH test  ?Call us back if you want to reconsider steroid shot for hives can you on a nurse visit schedule but ask Dr. Olivia Mackie 1st. ?Call and reschedule allergy appt please in [place] with Dr. Rexene Alberts  ? ?If atarax making you sleepy only take only at night 1/2 to 1pill though you can take up to 3x per day  ? ?Hives ?Hives (urticaria) are itchy, red, swollen areas on the skin. Hives can appear on any part of the body. Hives often fade within 24 hours (acute hives). Sometimes, new hives appear after old ones fade and the cycle can continue for several days or weeks (chronic hives). Hives do not spread from person to person (are not contagious). ?Hives come from the body's reaction to something a person is allergic to (allergen), something that causes irritation, or various other triggers. When a person is exposed to a trigger, his or her body releases a chemical (histamine) that causes redness, itching, and swelling. Hives can appear right after exposure to a trigger or hours later. ?What are the causes? ?This condition may be caused by: ?Allergies to foods or ingredients. ?Insect bites or stings. ?Exposure to pollen or pets. ?Spending time in sunlight, heat, or cold (exposure). ?Exercise. ?Stress. ?You can also get hives from other medical conditions and treatments, such as: ?Viruses, including the common cold. ?Bacterial infections, such as urinary tract infections and strep throat. ?Certain medicines. ?Contact with latex or chemicals. ?Allergy shots. ?Blood transfusions. ?Sometimes, the cause of this condition is not known (idiopathic hives). ?What increases the risk? ?You are more likely to develop this condition if you: ?Are a woman. ?Have food allergies, especially to citrus fruits, milk, eggs, peanuts, tree nuts, or shellfish. ?Are allergic to: ?Medicines. ?Latex. ?Insects. ?Animals. ?Pollen. ?What are the signs or symptoms? ?Common symptoms of this condition  include raised, itchy, red or white bumps or patches on your skin. These areas may: ?Become large and swollen (welts). ?Change in shape and location, quickly and repeatedly. ?Be separate hives or connect over a large area of skin. ?Sting or become painful. ?Turn white when pressed in the center (blanch). ?In severe cases, your hands, feet, and face may also become swollen. This may occur if hives develop deeper in your skin. ?How is this diagnosed? ?This condition may be diagnosed by your symptoms, medical history, and physical exam. ?Your skin, urine, or blood may be tested to find out what is causing your hives and to rule out other health issues. ?Your health care provider may also remove a small sample of skin from the affected area and examine it under a microscope (biopsy). ?How is this treated? ?Treatment for this condition depends on the cause and severity of your symptoms. Your health care provider may recommend using cool, wet cloths (cool compresses) or taking cool showers to relieve itching. Treatment may include: ?Medicines that help: ?Relieve itching (antihistamines). ?Reduce swelling (corticosteroids). ?Treat infection (antibiotics). ?An injectable medicine (omalizumab). Your health care provider may prescribe this if you have chronic idiopathic hives and you continue to have symptoms even after treatment with antihistamines. ?Severe cases may require an emergency injection of adrenaline (epinephrine) to prevent a life-threatening allergic reaction (anaphylaxis). ?Follow these instructions at home: ?Medicines ?Take and apply over-the-counter and prescription medicines only as told by your health care provider. ?If you were prescribed an antibiotic medicine, take it as told by your health care provider. Do not stop using the antibiotic even  if you start to feel better. ?Skin care ?Apply cool compresses to the affected areas. ?Do not scratch or rub your skin. ?General instructions ?Do not take hot  showers or baths. This can make itching worse. ?Do not wear tight-fitting clothing. ?Use sunscreen and wear protective clothing when you are outside. ?Avoid any substances that cause your hives. Keep a journal to help track what causes your hives. Write down: ?What medicines you take. ?What you eat and drink. ?What products you use on your skin. ?Keep all follow-up visits as told by your health care provider. This is important. ?Contact a health care provider if: ?Your symptoms are not controlled with medicine. ?Your joints are painful or swollen. ?Get help right away if: ?You have a fever. ?You have pain in your abdomen. ?Your tongue or lips are swollen. ?Your eyelids are swollen. ?Your chest or throat feels tight. ?You have trouble breathing or swallowing. ?These symptoms may represent a serious problem that is an emergency. Do not wait to see if the symptoms will go away. Get medical help right away. Call your local emergency services (911 in the [place] Do not drive yourself to the hospital. ?Summary ?Hives (urticaria) are itchy, red, swollen areas on your skin. Hives come from the body's reaction to something a person is allergic to (allergen), something that causes irritation, or various other triggers. ?Treatment for this condition depends on the cause and severity of your symptoms. ?Avoid any substances that cause your hives. Keep a journal to help track what causes your hives. ?Take and apply over-the-counter and prescription medicines only as told by your health care provider. ?Get help right away if your chest or throat feels tight or if you have trouble breathing or swallowing. ?This information is not intended to replace advice given to you by your health care provider. Make sure you discuss any questions you have with your health care provider. ?Document Revised: 12/06/2020 Document Reviewed: 12/06/2020 ?Elsevier Patient Education ? [place]. ? ?

## 2022-01-12 NOTE — Progress Notes (Signed)
Chief Complaint  ?Patient presents with  ? Skin Problem  ? ?Rash itching to left neck left upper and left lower arm, trunk  and rash moves no new products  ?Tried [facility] brand bendaryl this am at 1 am and helped with itching. 2 inner left thigh spots which she used bactorban to try to get rid of. Stress recently her twin sister died and brother died December 04, 2021  ?She will reschedule appt with allergy and asthma of [place] Dr. Rexene Alberts missed due to deaths in the family has # to reschedule ?Advised rec dermatology consult as well daughter robin works [employer] and pts wants appt in [facility] Dr. Charlyne Quale and he will call pt  ? ? ? ?Review of Systems  ?Constitutional:  Negative for weight loss.  ?HENT:  Negative for hearing loss.   ?Eyes:  Negative for blurred vision.  ?Respiratory:  Negative for shortness of breath.   ?Cardiovascular:  Negative for chest pain.  ?Gastrointestinal:  Negative for abdominal pain and blood in stool.  ?Genitourinary:  Negative for dysuria.  ?Musculoskeletal:  Negative for falls and joint pain.  ?Skin:  Negative for rash.  ?Neurological:  Negative for headaches.  ?Psychiatric/Behavioral:  Negative for depression.   ?Past Medical History:  ?Diagnosis Date  ? Heart murmur   ? Hyperlipidemia   ? Hypertension   ? Thyroid disease   ? goiter (follows with Dr. Ronnald Collum)  ? ?Past Surgical History:  ?Procedure Laterality Date  ? ABDOMINAL HYSTERECTOMY  1978  ? ovaries left in place  ? APPENDECTOMY  1978  ? BREAST BIOPSY Right 09/29/2017  ? Pt had Bx  done in [facility]'s office [place] @ 3:00 per chart-"FIBROCYSTIC CHANGES".  ? COLONOSCOPY WITH PROPOFOL N/A 07/11/2017  ? Procedure: COLONOSCOPY WITH PROPOFOL;  Surgeon: Lucilla Lame, MD;  Location: [facility] ENDOSCOPY;  Service: Endoscopy;  Laterality: N/A;  ? TUBAL LIGATION    ? ?Family History  ?Problem Relation Age of Onset  ? Stroke Mother   ? Hypertension Mother   ? Arthritis Mother   ? Breast cancer Sister 1  ? Breast cancer Sister 55  ? Lung cancer  Brother   ? Diabetes Brother   ? ?Social History  ? ?Socioeconomic History  ? Marital status: Single  ?  Spouse name: Not on file  ? Number of children: 3  ? Years of education: Not on file  ? Highest education level: Not on file  ?Occupational History  ? Not on file  ?Tobacco Use  ? Smoking status: Former  ? Smokeless tobacco: Never  ?Vaping Use  ? Vaping Use: Never used  ?Substance and Sexual Activity  ? Alcohol use: No  ?  Alcohol/week: 0.0 standard drinks  ? Drug use: No  ? Sexual activity: Not on file  ?Other Topics Concern  ? Not on file  ?Social History Narrative  ? Twin sister died 12-04-2021 and her brother died 2021-12-04   ? ?Social Determinants of Health  ? ?Financial Resource Strain: Not on file  ?Food Insecurity: Not on file  ?Transportation Needs: Not on file  ?Physical Activity: Not on file  ?Stress: Not on file  ?Social Connections: Not on file  ?Intimate Partner Violence: Not on file  ? ?Current Meds  ?Medication Sig  ? atorvastatin (LIPITOR) 40 MG tablet TAKE 1 TABLET EVERY DAY  ? EPINEPHrine (EPIPEN 2-PAK) 0.3 mg/0.3 mL IJ SOAJ injection USE AS DIRECTED  ? hydrochlorothiazide (HYDRODIURIL) 25 MG tablet TAKE 1 TABLET (25 MG TOTAL) BY MOUTH DAILY.  ?  hydrOXYzine (ATARAX) 25 MG tablet Take 0.5-1 tablets (12.5-25 mg total) by mouth every 8 (eight) hours as needed.  ? levothyroxine (SYNTHROID, LEVOTHROID) 25 MCG tablet Take 25 mcg by mouth daily before breakfast.  ? losartan (COZAAR) 100 MG tablet TAKE 1 TABLET EVERY DAY  ? potassium chloride (KLOR-CON) 10 MEQ tablet TAKE 1 TABLET BY MOUTH TWICE A DAY  ? TIADYLT ER 360 MG 24 hr capsule TAKE 1 CAPSULE EVERY DAY  ? triamcinolone cream (KENALOG) 0.1 % Apply 1 application. topically 2 (two) times daily. Prn itching hives to body  ? ?Allergies  ?Allergen Reactions  ? Sulfa Antibiotics Rash  ? ?No results found for this or any previous visit (from the past 2160 hour(s)). ?Objective  ?Body mass index is 24.49 kg/m?. ?Wt Readings from Last 3 Encounters:  ?01/12/22  138 lb 3.2 oz (62.7 kg)  ?12/06/21 145 lb (65.8 kg)  ?10/27/21 138 lb 3.2 oz (62.7 kg)  ? ?Temp Readings from Last 3 Encounters:  ?01/12/22 97.6 ?F (36.4 ?C) (Oral)  ?09/01/21 97.6 ?F (36.4 ?C) (Oral)  ?07/12/21 97.7 ?F (36.5 ?C) (Oral)  ? ?BP Readings from Last 3 Encounters:  ?01/12/22 124/68  ?12/06/21 (!) 142/68  ?11/02/21 138/72  ? ?Pulse Readings from Last 3 Encounters:  ?01/12/22 70  ?12/06/21 73  ?10/27/21 69  ? ? ?Physical Exam ?Vitals and nursing note reviewed.  ?Constitutional:   ?   Appearance: Normal appearance. She is well-developed and well-groomed.  ?HENT:  ?   Head: Normocephalic and atraumatic.  ?Eyes:  ?   Conjunctiva/sclera: Conjunctivae normal.  ?   Pupils: Pupils are equal, round, and reactive to light.  ?Cardiovascular:  ?   Rate and Rhythm: Normal rate and regular rhythm.  ?   Heart sounds: Normal heart sounds. No murmur heard. ?Pulmonary:  ?   Effort: Pulmonary effort is normal.  ?   Breath sounds: Normal breath sounds.  ?Abdominal:  ?   General: Abdomen is flat. Bowel sounds are normal.  ?   Tenderness: There is no abdominal tenderness.  ?Musculoskeletal:     ?   General: No tenderness.  ?Skin: ?   General: Skin is warm and dry.  ?   Findings: Rash present.  ?Neurological:  ?   General: No focal deficit present.  ?   Mental Status: She is alert and oriented to person, place, and time. Mental status is at baseline.  ?   Cranial Nerves: Cranial nerves 2-12 are intact.  ?   Motor: Motor function is intact.  ?   Coordination: Coordination is intact.  ?   Gait: Gait is intact.  ?Psychiatric:     ?   Attention and Perception: Attention and perception normal.     ?   Mood and Affect: Mood and affect normal.     ?   Speech: Speech normal.     ?   Behavior: Behavior normal. Behavior is cooperative.     ?   Thought Content: Thought content normal.     ?   Cognition and Memory: Cognition and memory normal.     ?   Judgment: Judgment normal.  ? ? ?Assessment  ?Plan  ?Hives could be due to recent  stressors I.e deaths in the family/grief - Plan: hydrOXYzine (ATARAX) 12.5 -25 MG tablet qd to tid prn, triamcinolone cream (KENALOG) 0.1 %  bid prn  ?Call and resch appt with allergist and asthma in [place] Dr. Rexene Alberts and also dermatology referred [facility] in [place]  spoke to [place] and he will work pt in and call pt  ? ?Provider: Dr. Olivia Mackie McLean-Scocuzza-Internal Medicine  ?

## 2022-01-17 ENCOUNTER — Other Ambulatory Visit: Payer: Self-pay | Admitting: Internal Medicine

## 2022-01-17 DIAGNOSIS — L509 Urticaria, unspecified: Secondary | ICD-10-CM

## 2022-01-24 ENCOUNTER — Other Ambulatory Visit (INDEPENDENT_AMBULATORY_CARE_PROVIDER_SITE_OTHER): Payer: Medicare HMO

## 2022-01-24 ENCOUNTER — Other Ambulatory Visit: Payer: Self-pay

## 2022-01-24 DIAGNOSIS — E1165 Type 2 diabetes mellitus with hyperglycemia: Secondary | ICD-10-CM

## 2022-01-24 DIAGNOSIS — E7800 Pure hypercholesterolemia, unspecified: Secondary | ICD-10-CM | POA: Diagnosis not present

## 2022-01-24 LAB — BASIC METABOLIC PANEL
BUN: 9 mg/dL (ref 6–23)
CO2: 30 mEq/L (ref 19–32)
Calcium: 9.5 mg/dL (ref 8.4–10.5)
Chloride: 99 mEq/L (ref 96–112)
Creatinine, Ser: 0.86 mg/dL (ref 0.40–1.20)
GFR: 64.58 mL/min (ref >60.00)
Glucose, Bld: 94 mg/dL (ref 70–99)
Potassium: 3.4 mEq/L — ABNORMAL LOW (ref 3.5–5.1)
Sodium: 136 mEq/L (ref 135–145)

## 2022-01-24 LAB — HEMOGLOBIN A1C: Hgb A1c MFr Bld: 6.6 % — ABNORMAL HIGH (ref 4.6–6.5)

## 2022-01-24 LAB — HEPATIC FUNCTION PANEL
ALT: 12 U/L (ref 0–35)
AST: 14 U/L (ref 0–37)
Albumin: 4.3 g/dL (ref 3.5–5.2)
Alkaline Phosphatase: 52 U/L (ref 39–117)
Bilirubin, Direct: 0.2 mg/dL (ref 0.0–0.3)
Total Bilirubin: 1 mg/dL (ref 0.2–1.2)
Total Protein: 7.1 g/dL (ref 6.0–8.3)

## 2022-01-24 LAB — LIPID PANEL
Cholesterol: 151 mg/dL (ref 0–200)
HDL: 58.5 mg/dL (ref >39.00)
LDL Cholesterol: 70 mg/dL (ref 0–99)
NonHDL: 92.47
Total CHOL/HDL Ratio: 3
Triglycerides: 112 mg/dL (ref 0.0–149.0)
VLDL: 22.4 mg/dL (ref 0.0–40.0)

## 2022-01-26 ENCOUNTER — Ambulatory Visit (INDEPENDENT_AMBULATORY_CARE_PROVIDER_SITE_OTHER): Payer: Medicare HMO | Admitting: Internal Medicine

## 2022-01-26 ENCOUNTER — Other Ambulatory Visit: Payer: Self-pay

## 2022-01-26 VITALS — BP 126/70 | HR 65 | Temp 98.0°F | Resp 16 | Ht 63.0 in | Wt 136.0 lb

## 2022-01-26 DIAGNOSIS — T7840XD Allergy, unspecified, subsequent encounter: Secondary | ICD-10-CM

## 2022-01-26 DIAGNOSIS — Z0000 Encounter for general adult medical examination without abnormal findings: Secondary | ICD-10-CM

## 2022-01-26 DIAGNOSIS — I10 Essential (primary) hypertension: Secondary | ICD-10-CM | POA: Diagnosis not present

## 2022-01-26 DIAGNOSIS — E079 Disorder of thyroid, unspecified: Secondary | ICD-10-CM | POA: Diagnosis not present

## 2022-01-26 DIAGNOSIS — E7800 Pure hypercholesterolemia, unspecified: Secondary | ICD-10-CM | POA: Diagnosis not present

## 2022-01-26 DIAGNOSIS — E1122 Type 2 diabetes mellitus with diabetic chronic kidney disease: Secondary | ICD-10-CM | POA: Diagnosis not present

## 2022-01-26 DIAGNOSIS — I700 Atherosclerosis of aorta: Secondary | ICD-10-CM | POA: Diagnosis not present

## 2022-01-26 DIAGNOSIS — E876 Hypokalemia: Secondary | ICD-10-CM

## 2022-01-26 DIAGNOSIS — D649 Anemia, unspecified: Secondary | ICD-10-CM | POA: Diagnosis not present

## 2022-01-26 DIAGNOSIS — E1165 Type 2 diabetes mellitus with hyperglycemia: Secondary | ICD-10-CM | POA: Diagnosis not present

## 2022-01-26 MED ORDER — POTASSIUM CHLORIDE ER 10 MEQ PO TBCR: 10.0000 meq | EXTENDED_RELEASE_TABLET | Freq: Every day | ORAL | 5 refills | Status: DC

## 2022-01-26 NOTE — Assessment & Plan Note (Signed)
Physical today 01/26/22.  Mammogram 09/10/21 - Birads I.  Colonoscopy 07/2017.  Recommended f/u in 5 years.  ?

## 2022-01-26 NOTE — Patient Instructions (Signed)
Take potassium - one per day.  ?

## 2022-01-26 NOTE — Progress Notes (Signed)
Patient ID: Christina Cole, female   DOB: November 22, 1940, 81 y.o.   MRN: [id number] ? ? ?Subjective:  ? ? Patient ID: Christina Cole, female    DOB: January 05, 1941, 81 y.o.   MRN: [id number] ? ?This visit occurred during the SARS-CoV-2 public health emergency.  Safety protocols were in place, including screening questions prior to the visit, additional usage of staff PPE, and extensive cleaning of exam room while observing appropriate contact time as indicated for disinfecting solutions.  ? ?Patient here for her physical exam.  ? ?Chief Complaint  ?Patient presents with  ? Annual Exam  ? .  ? ?HPI ?Here for her physical.  Increased stress.  Discussed.  Has had recent problems with hives.  Placed on hydroxyzine.  Planning to see dermatology.  Is scheduling.  Stays active.  No chest pain or sob reported.  No abdominal pain.  Bowels moving.  ? ? ?Past Medical History:  ?Diagnosis Date  ? Heart murmur   ? Hyperlipidemia   ? Hypertension   ? Thyroid disease   ? goiter (follows with Dr. Ronnald Collum)  ? ?Past Surgical History:  ?Procedure Laterality Date  ? ABDOMINAL HYSTERECTOMY  1978  ? ovaries left in place  ? APPENDECTOMY  1978  ? BREAST BIOPSY Right 09/29/2017  ? Pt had Bx  done in [facility] office [place] @ 3:00 per chart-"FIBROCYSTIC CHANGES".  ? COLONOSCOPY WITH PROPOFOL N/A 07/11/2017  ? Procedure: COLONOSCOPY WITH PROPOFOL;  Surgeon: Lucilla Lame, MD;  Location: [facility] ENDOSCOPY;  Service: Endoscopy;  Laterality: N/A;  ? TUBAL LIGATION    ? ?Family History  ?Problem Relation Age of Onset  ? Stroke Mother   ? Hypertension Mother   ? Arthritis Mother   ? Breast cancer Sister 56  ? Breast cancer Sister 61  ? Lung cancer Brother   ? Diabetes Brother   ? ?Social History  ? ?Socioeconomic History  ? Marital status: Single  ?  Spouse name: Not on file  ? Number of children: 3  ? Years of education: Not on file  ? Highest education level: Not on file  ?Occupational History  ? Not on file  ?Tobacco Use  ? Smoking status: Former  ?  Smokeless tobacco: Never  ?Vaping Use  ? Vaping Use: Never used  ?Substance and Sexual Activity  ? Alcohol use: No  ?  Alcohol/week: 0.0 standard drinks  ? Drug use: No  ? Sexual activity: Not on file  ?Other Topics Concern  ? Not on file  ?Social History Narrative  ? Twin sister died 2021/12/13 and her brother died 2021-12-13   ? ?Social Determinants of Health  ? ?Financial Resource Strain: Not on file  ?Food Insecurity: Not on file  ?Transportation Needs: Not on file  ?Physical Activity: Not on file  ?Stress: Not on file  ?Social Connections: Not on file  ? ? ? ?Review of Systems  ?Constitutional:  Negative for appetite change and unexpected weight change.  ?HENT:  Negative for congestion, sinus pressure and sore throat.   ?Eyes:  Negative for pain and visual disturbance.  ?Respiratory:  Negative for cough, chest tightness and shortness of breath.   ?Cardiovascular:  Negative for chest pain, palpitations and leg swelling.  ?Gastrointestinal:  Negative for abdominal pain, diarrhea, nausea and vomiting.  ?Genitourinary:  Negative for difficulty urinating and dysuria.  ?Musculoskeletal:  Negative for joint swelling and myalgias.  ?Skin:  Negative for color change and rash.  ?Neurological:  Negative for dizziness, light-headedness and headaches.  ?  Hematological:  Negative for adenopathy. Does not bruise/bleed easily.  ?Psychiatric/Behavioral:  Negative for agitation and dysphoric mood.   ? ?   ?Objective:  ?  ? ?BP 126/70   Pulse 65   Temp 98 ?F (36.7 ?C)   Resp 16   Ht '5\' 3"'$  (1.6 m)   Wt 136 lb (61.7 kg)   LMP 12/21/1976   SpO2 98%   BMI 24.09 kg/m?  ?Wt Readings from Last 3 Encounters:  ?01/26/22 136 lb (61.7 kg)  ?01/12/22 138 lb 3.2 oz (62.7 kg)  ?12/06/21 145 lb (65.8 kg)  ? ? ?Physical Exam ?Vitals reviewed.  ?Constitutional:   ?   General: She is not in acute distress. ?   Appearance: Normal appearance. She is well-developed.  ?HENT:  ?   Head: Normocephalic and atraumatic.  ?   Right Ear: External ear normal.   ?   Left Ear: External ear normal.  ?Eyes:  ?   General: No scleral icterus.    ?   Right eye: No discharge.     ?   Left eye: No discharge.  ?   Conjunctiva/sclera: Conjunctivae normal.  ?Neck:  ?   Thyroid: No thyromegaly.  ?Cardiovascular:  ?   Rate and Rhythm: Normal rate and regular rhythm.  ?Pulmonary:  ?   Effort: No tachypnea, accessory muscle usage or respiratory distress.  ?   Breath sounds: Normal breath sounds. No decreased breath sounds or wheezing.  ?Chest:  ?Breasts: ?   Right: No inverted nipple, mass, nipple discharge or tenderness (no axillary adenopathy).  ?   Left: No inverted nipple, mass, nipple discharge or tenderness (no axilarry adenopathy).  ?Abdominal:  ?   General: Bowel sounds are normal.  ?   Palpations: Abdomen is soft.  ?   Tenderness: There is no abdominal tenderness.  ?Musculoskeletal:     ?   General: No swelling or tenderness.  ?   Cervical back: Neck supple.  ?Lymphadenopathy:  ?   Cervical: No cervical adenopathy.  ?Skin: ?   Findings: No erythema or rash.  ?Neurological:  ?   Mental Status: She is alert and oriented to person, place, and time.  ?Psychiatric:     ?   Mood and Affect: Mood normal.     ?   Behavior: Behavior normal.  ? ? ? ?Outpatient Encounter Medications as of 01/26/2022  ?Medication Sig  ? atorvastatin (LIPITOR) 40 MG tablet TAKE 1 TABLET EVERY DAY  ? EPINEPHrine (EPIPEN 2-PAK) 0.3 mg/0.3 mL IJ SOAJ injection USE AS DIRECTED  ? hydrochlorothiazide (HYDRODIURIL) 25 MG tablet TAKE 1 TABLET (25 MG TOTAL) BY MOUTH DAILY.  ? hydrOXYzine (ATARAX) 25 MG tablet Take 0.5-1 tablets (12.5-25 mg total) by mouth every 8 (eight) hours as needed.  ? levothyroxine (SYNTHROID, LEVOTHROID) 25 MCG tablet Take 25 mcg by mouth daily before breakfast.  ? losartan (COZAAR) 100 MG tablet TAKE 1 TABLET EVERY DAY  ? potassium chloride (KLOR-CON) 10 MEQ tablet Take 1 tablet (10 mEq total) by mouth daily.  ? TIADYLT ER 360 MG 24 hr capsule TAKE 1 CAPSULE EVERY DAY  ? triamcinolone cream  (KENALOG) 0.1 % Apply 1 application. topically 2 (two) times daily. Prn itching hives to body  ? [DISCONTINUED] amoxicillin-clavulanate (AUGMENTIN) 875-125 MG tablet Take 1 tablet by mouth 2 (two) times daily. (Patient not taking: Reported on 01/12/2022)  ? [DISCONTINUED] potassium chloride (KLOR-CON) 10 MEQ tablet TAKE 1 TABLET BY MOUTH TWICE A DAY  ? ?No facility-administered encounter medications  on file as of 01/26/2022.  ?  ? ?Lab Results  ?Component Value Date  ? WBC 5.3 04/07/2021  ? HGB 12.1 04/07/2021  ? HCT 37.2 04/07/2021  ? PLT 196.0 04/07/2021  ? GLUCOSE 94 01/24/2022  ? CHOL 151 01/24/2022  ? TRIG 112.0 01/24/2022  ? HDL 58.50 01/24/2022  ? [place] 70 01/24/2022  ? ALT 12 01/24/2022  ? AST 14 01/24/2022  ? NA 136 01/24/2022  ? K 3.4 (L) 01/24/2022  ? CL 99 01/24/2022  ? CREATININE 0.86 01/24/2022  ? BUN 9 01/24/2022  ? CO2 30 01/24/2022  ? TSH 1.08 09/28/2021  ? HGBA1C 6.6 (H) 01/24/2022  ? MICROALBUR <0.7 12/14/2020  ? ? ?MM 3D SCREEN BREAST BILATERAL ? ?Result Date: 09/10/2021 ?CLINICAL DATA:  Screening. EXAM: DIGITAL SCREENING BILATERAL MAMMOGRAM WITH TOMOSYNTHESIS AND CAD TECHNIQUE: Bilateral screening digital craniocaudal and mediolateral oblique mammograms were obtained. Bilateral screening digital breast tomosynthesis was performed. The images were evaluated with computer-aided detection. COMPARISON:  Previous exam(s). ACR Breast Density Category c: The breast tissue is heterogeneously dense, which may obscure small masses. FINDINGS: There are no findings suspicious for malignancy. IMPRESSION: No mammographic evidence of malignancy. A result letter of this screening mammogram will be mailed directly to the patient. RECOMMENDATION: Screening mammogram in one year. (Code:SM-B-01Y) BI-RADS CATEGORY  1: Negative. Electronically Signed   By: Valentino Saxon M.D.   On: 09/10/2021 16:51 ? ? ?   ?Assessment & Plan:  ? ?Problem List Items Addressed This Visit   ? ? Allergic reaction  ?  Recently  evaluated in ER - third episode - as outlined. See previous note.  Unclear etiology.  Has hydroxyzine - prn.  Planning appt with dermatologist/allergist.  ?  ?  ? Anemia  ?  Follow cbc.  ?  ?  ? Aortic atherosclerosis ([place])

## 2022-01-31 ENCOUNTER — Encounter: Payer: Self-pay | Admitting: Internal Medicine

## 2022-01-31 NOTE — Assessment & Plan Note (Signed)
Low carb diet and exercise given elevated blood sugars.  Follow met b and a1c. Recent check - 6.6.  ?

## 2022-01-31 NOTE — Assessment & Plan Note (Deleted)
GFR - 56.   Stay hydrated.  Avoid antiinflammatories.  Follow metabolic panel.  ?

## 2022-01-31 NOTE — Assessment & Plan Note (Addendum)
Slightly decreased potassium on recent lab.  Recheck potassium.  Follow. kcl 10 meq q day.  ?

## 2022-01-31 NOTE — Assessment & Plan Note (Signed)
Continue lipitor  ?

## 2022-01-31 NOTE — Assessment & Plan Note (Signed)
Follow cbc.  

## 2022-01-31 NOTE — Assessment & Plan Note (Signed)
Recently evaluated in ER - third episode - as outlined. See previous note.  Unclear etiology.  Has hydroxyzine - prn.  Planning appt with dermatologist/allergist.  ?

## 2022-01-31 NOTE — Assessment & Plan Note (Signed)
Continue lipitor.  Low cholesterol diet and exercise.  Follow lipid panel and liver function tests.   

## 2022-01-31 NOTE — Assessment & Plan Note (Signed)
On thyroid replacement.  Follow tsh.  

## 2022-01-31 NOTE — Assessment & Plan Note (Signed)
Blood pressure as outlined.  Continue losartan and hctz.  Follow pressures.  Follow metabolic panel.  ?

## 2022-02-08 DIAGNOSIS — L509 Urticaria, unspecified: Secondary | ICD-10-CM | POA: Diagnosis not present

## 2022-02-08 DIAGNOSIS — L989 Disorder of the skin and subcutaneous tissue, unspecified: Secondary | ICD-10-CM | POA: Diagnosis not present

## 2022-02-16 ENCOUNTER — Other Ambulatory Visit (INDEPENDENT_AMBULATORY_CARE_PROVIDER_SITE_OTHER): Payer: Medicare HMO

## 2022-02-16 DIAGNOSIS — E876 Hypokalemia: Secondary | ICD-10-CM | POA: Diagnosis not present

## 2022-02-16 LAB — POTASSIUM: Potassium: 3.4 mEq/L — ABNORMAL LOW (ref 3.5–5.1)

## 2022-02-17 ENCOUNTER — Telehealth: Payer: Self-pay | Admitting: Internal Medicine

## 2022-02-17 NOTE — Telephone Encounter (Signed)
Copied from [place] [id number]. Topic: Medicare AWV ?>> Feb 17, 2022 11:17 AM Harris-Coley, Hannah Beat wrote: ?Reason for CRM: Left message for patient to schedule Annual Wellness Visit.  Please schedule with Nurse Health Advisor Denisa O'Brien-Blaney, LPN at [facility].  Please call [phone] ask for Juliann Pulse ?

## 2022-02-23 ENCOUNTER — Other Ambulatory Visit: Payer: Self-pay | Admitting: *Deleted

## 2022-02-23 DIAGNOSIS — E876 Hypokalemia: Secondary | ICD-10-CM

## 2022-02-23 NOTE — Progress Notes (Deleted)
Pt notified of results, was taking 10 meq QD, will increase to BID. Lab orders placed & lab appt scheduled for 03/10/22. ?

## 2022-02-24 ENCOUNTER — Other Ambulatory Visit: Payer: Self-pay | Admitting: Internal Medicine

## 2022-02-24 DIAGNOSIS — L509 Urticaria, unspecified: Secondary | ICD-10-CM

## 2022-03-10 ENCOUNTER — Other Ambulatory Visit (INDEPENDENT_AMBULATORY_CARE_PROVIDER_SITE_OTHER): Payer: Medicare HMO

## 2022-03-10 DIAGNOSIS — E876 Hypokalemia: Secondary | ICD-10-CM | POA: Diagnosis not present

## 2022-03-10 LAB — POTASSIUM: Potassium: 3.4 mEq/L — ABNORMAL LOW (ref 3.5–5.1)

## 2022-03-18 DIAGNOSIS — I10 Essential (primary) hypertension: Secondary | ICD-10-CM | POA: Diagnosis not present

## 2022-03-18 DIAGNOSIS — E785 Hyperlipidemia, unspecified: Secondary | ICD-10-CM | POA: Diagnosis not present

## 2022-03-18 DIAGNOSIS — E039 Hypothyroidism, unspecified: Secondary | ICD-10-CM | POA: Diagnosis not present

## 2022-03-18 DIAGNOSIS — E041 Nontoxic single thyroid nodule: Secondary | ICD-10-CM | POA: Diagnosis not present

## 2022-03-18 DIAGNOSIS — E049 Nontoxic goiter, unspecified: Secondary | ICD-10-CM | POA: Diagnosis not present

## 2022-03-22 DIAGNOSIS — R600 Localized edema: Secondary | ICD-10-CM | POA: Diagnosis not present

## 2022-03-22 DIAGNOSIS — W57XXXA Bitten or stung by nonvenomous insect and other nonvenomous arthropods, initial encounter: Secondary | ICD-10-CM | POA: Diagnosis not present

## 2022-03-22 DIAGNOSIS — E119 Type 2 diabetes mellitus without complications: Secondary | ICD-10-CM | POA: Diagnosis not present

## 2022-03-22 DIAGNOSIS — S80861A Insect bite (nonvenomous), right lower leg, initial encounter: Secondary | ICD-10-CM | POA: Diagnosis not present

## 2022-03-22 DIAGNOSIS — R238 Other skin changes: Secondary | ICD-10-CM | POA: Diagnosis not present

## 2022-03-22 DIAGNOSIS — I10 Essential (primary) hypertension: Secondary | ICD-10-CM | POA: Diagnosis not present

## 2022-03-22 DIAGNOSIS — L509 Urticaria, unspecified: Secondary | ICD-10-CM | POA: Diagnosis not present

## 2022-03-22 DIAGNOSIS — M1990 Unspecified osteoarthritis, unspecified site: Secondary | ICD-10-CM | POA: Diagnosis not present

## 2022-03-22 DIAGNOSIS — R21 Rash and other nonspecific skin eruption: Secondary | ICD-10-CM | POA: Diagnosis not present

## 2022-03-23 ENCOUNTER — Telehealth: Payer: Self-pay | Admitting: Internal Medicine

## 2022-03-23 NOTE — Telephone Encounter (Signed)
Pt returning call

## 2022-03-24 NOTE — Telephone Encounter (Signed)
S/w pt - confirmed she is taking 73mq BID. Pt sched for 6/5 at 11am for visit to discuss with you.

## 2022-03-30 DIAGNOSIS — E041 Nontoxic single thyroid nodule: Secondary | ICD-10-CM | POA: Diagnosis not present

## 2022-03-30 DIAGNOSIS — I10 Essential (primary) hypertension: Secondary | ICD-10-CM | POA: Diagnosis not present

## 2022-03-30 DIAGNOSIS — E039 Hypothyroidism, unspecified: Secondary | ICD-10-CM | POA: Diagnosis not present

## 2022-03-30 DIAGNOSIS — E785 Hyperlipidemia, unspecified: Secondary | ICD-10-CM | POA: Diagnosis not present

## 2022-03-30 DIAGNOSIS — E049 Nontoxic goiter, unspecified: Secondary | ICD-10-CM | POA: Diagnosis not present

## 2022-04-04 ENCOUNTER — Ambulatory Visit (INDEPENDENT_AMBULATORY_CARE_PROVIDER_SITE_OTHER): Payer: Medicare HMO | Admitting: Internal Medicine

## 2022-04-04 ENCOUNTER — Encounter: Payer: Self-pay | Admitting: Internal Medicine

## 2022-04-04 VITALS — BP 122/70 | HR 65 | Temp 98.0°F | Resp 14 | Ht 63.0 in | Wt 137.6 lb

## 2022-04-04 DIAGNOSIS — I700 Atherosclerosis of aorta: Secondary | ICD-10-CM

## 2022-04-04 DIAGNOSIS — L989 Disorder of the skin and subcutaneous tissue, unspecified: Secondary | ICD-10-CM | POA: Diagnosis not present

## 2022-04-04 DIAGNOSIS — N1831 Chronic kidney disease, stage 3a: Secondary | ICD-10-CM

## 2022-04-04 DIAGNOSIS — E1165 Type 2 diabetes mellitus with hyperglycemia: Secondary | ICD-10-CM | POA: Diagnosis not present

## 2022-04-04 DIAGNOSIS — E876 Hypokalemia: Secondary | ICD-10-CM | POA: Diagnosis not present

## 2022-04-04 DIAGNOSIS — I10 Essential (primary) hypertension: Secondary | ICD-10-CM | POA: Diagnosis not present

## 2022-04-04 DIAGNOSIS — E7800 Pure hypercholesterolemia, unspecified: Secondary | ICD-10-CM

## 2022-04-04 MED ORDER — TRIAMTERENE-HCTZ 37.5-25 MG PO TABS: 1.0000 | ORAL_TABLET | Freq: Every day | ORAL | 2 refills | Status: DC

## 2022-04-04 NOTE — Progress Notes (Unsigned)
Patient ID: Christina Cole, female   DOB: 08-27-1941, 81 y.o.   MRN: [id number]   Subjective:    Patient ID: Christina Cole, female    DOB: January 12, 1941, 81 y.o.   MRN: [id number]   Patient here for work in appt.  Marland Kitchen   HPI Work in to discuss recent hypokalemia and blood pressure medication changes.  On recent labs, potassium slightly decreased.  Is on hctz.  Discussed.  Since having to increased potassium supplement, discussed the possibility of changing her hctz to triam/hctz.  Blood pressure has been doing well. Tries to stay active.  No chest pain or sob reported.  No abdominal pain or bowel change reported.  Recently had biopsy - right lower leg lesion.  Biopsy c/w bug bite.  Has multiple lesions - two right hip, left hip and left inner thigh.  Just evaluated by dermatology.  Given topical cream for itching.  No fever.  No other rash.     Past Medical History:  Diagnosis Date   Heart murmur    Hyperlipidemia    Hypertension    Thyroid disease    goiter (follows with Dr. Ronnald Collum)   Past Surgical History:  Procedure Laterality Date   ABDOMINAL HYSTERECTOMY  1978   ovaries left in place   [place] Right 09/29/2017   Pt had Bx  done in [facility]'s office [place] @ 3:00 per chart-"FIBROCYSTIC CHANGES".   COLONOSCOPY WITH PROPOFOL N/A 07/11/2017   Procedure: COLONOSCOPY WITH PROPOFOL;  Surgeon: Lucilla Lame, MD;  Location: [facility] ENDOSCOPY;  Service: Endoscopy;  Laterality: N/A;   TUBAL LIGATION     Family History  Problem Relation Age of Onset   Stroke Mother    Hypertension Mother    Arthritis Mother    Breast cancer Sister 29   Breast cancer Sister 27   Lung cancer Brother    Diabetes Brother    Social History   Socioeconomic History   Marital status: Single    Spouse name: Not on file   Number of children: 3   Years of education: Not on file   Highest education level: Not on file  Occupational History   Not on file  Tobacco Use   Smoking  status: Former   Smokeless tobacco: Never  Vaping Use   Vaping Use: Never used  Substance and Sexual Activity   Alcohol use: No    Alcohol/week: 0.0 standard drinks   Drug use: No   Sexual activity: Not on file  Other Topics Concern   Not on file  Social History Narrative   Twin sister died 12-20-21 and her brother died 2021/12/20    Social Determinants of Health   Financial Resource Strain: Not on file  Food Insecurity: Not on file  Transportation Needs: Not on file  Physical Activity: Not on file  Stress: Not on file  Social Connections: Not on file     Review of Systems  Constitutional:  Negative for appetite change and unexpected weight change.  HENT:  Negative for congestion and sinus pressure.   Respiratory:  Negative for cough, chest tightness and shortness of breath.   Cardiovascular:  Negative for chest pain, palpitations and leg swelling.  Gastrointestinal:  Negative for abdominal pain, diarrhea, nausea and vomiting.  Genitourinary:  Negative for difficulty urinating and dysuria.  Musculoskeletal:  Negative for joint swelling and myalgias.  Skin:  Negative for color change and rash.       Skin lesions as outlined.  Neurological:  Negative for dizziness, light-headedness and headaches.  Psychiatric/Behavioral:  Negative for agitation and dysphoric mood.       Objective:     BP 122/70 (BP Location: Left Arm, Patient Position: Sitting, Cuff Size: Small)   Pulse 65   Temp 98 F (36.7 C) (Temporal)   Resp 14   Ht '5\' 3"'  (1.6 m)   Wt 137 lb 9.6 oz (62.4 kg)   LMP 12/21/1976   SpO2 97%   BMI 24.37 kg/m  Wt Readings from Last 3 Encounters:  04/04/22 137 lb 9.6 oz (62.4 kg)  01/26/22 136 lb (61.7 kg)  01/12/22 138 lb 3.2 oz (62.7 kg)    Physical Exam Vitals reviewed.  Constitutional:      General: She is not in acute distress.    Appearance: Normal appearance.  HENT:     Head: Normocephalic and atraumatic.     Right Ear: External ear normal.     Left Ear:  External ear normal.  Eyes:     General: No scleral icterus.       Right eye: No discharge.        Left eye: No discharge.     Conjunctiva/sclera: Conjunctivae normal.  Neck:     Thyroid: No thyromegaly.  Cardiovascular:     Rate and Rhythm: Normal rate and regular rhythm.  Pulmonary:     Effort: No respiratory distress.     Breath sounds: Normal breath sounds. No wheezing.  Abdominal:     General: Bowel sounds are normal.     Palpations: Abdomen is soft.     Tenderness: There is no abdominal tenderness.  Musculoskeletal:        General: No swelling or tenderness.     Cervical back: Neck supple. No tenderness.  Lymphadenopathy:     Cervical: No cervical adenopathy.  Skin:    Findings: No erythema or rash.     Comments: Skin lesions as outlined.   Neurological:     Mental Status: She is alert.  Psychiatric:        Mood and Affect: Mood normal.        Behavior: Behavior normal.     Outpatient Encounter Medications as of 04/04/2022  Medication Sig   atorvastatin (LIPITOR) 40 MG tablet TAKE 1 TABLET EVERY DAY   EPINEPHrine (EPIPEN 2-PAK) 0.3 mg/0.3 mL IJ SOAJ injection USE AS DIRECTED   hydrOXYzine (ATARAX) 25 MG tablet TAKE 1/2 TO 1 TABLET (12.5-25 MG TOTAL) BY MOUTH EVERY 8 (EIGHT) HOURS AS NEEDED.   levothyroxine (SYNTHROID, LEVOTHROID) 25 MCG tablet Take 25 mcg by mouth daily before breakfast.   losartan (COZAAR) 100 MG tablet TAKE 1 TABLET EVERY DAY   TIADYLT ER 360 MG 24 hr capsule TAKE 1 CAPSULE EVERY DAY   triamcinolone cream (KENALOG) 0.1 % Apply 1 application. topically 2 (two) times daily. Prn itching hives to body   triamterene-hydrochlorothiazide (MAXZIDE-25) 37.5-25 MG tablet Take 1 tablet by mouth daily.   [DISCONTINUED] hydrochlorothiazide (HYDRODIURIL) 25 MG tablet TAKE 1 TABLET (25 MG TOTAL) BY MOUTH DAILY.   [DISCONTINUED] potassium chloride (KLOR-CON) 10 MEQ tablet Take 1 tablet (10 mEq total) by mouth daily.   No facility-administered encounter medications  on file as of 04/04/2022.     Lab Results  Component Value Date   WBC 5.3 04/07/2021   HGB 12.1 04/07/2021   HCT 37.2 04/07/2021   PLT 196.0 04/07/2021   GLUCOSE 94 01/24/2022   CHOL 151 01/24/2022   TRIG 112.0 01/24/2022  HDL 58.50 01/24/2022   LDLCALC 70 01/24/2022   ALT 12 01/24/2022   AST 14 01/24/2022   NA 136 01/24/2022   K 3.4 (L) 03/10/2022   CL 99 01/24/2022   CREATININE 0.86 01/24/2022   BUN 9 01/24/2022   CO2 30 01/24/2022   TSH 1.08 09/28/2021   HGBA1C 6.6 (H) 01/24/2022   MICROALBUR <0.7 12/14/2020    MM 3D SCREEN BREAST BILATERAL  Result Date: 09/10/2021 CLINICAL DATA:  Screening. EXAM: DIGITAL SCREENING BILATERAL MAMMOGRAM WITH TOMOSYNTHESIS AND CAD TECHNIQUE: Bilateral screening digital craniocaudal and mediolateral oblique mammograms were obtained. Bilateral screening digital breast tomosynthesis was performed. The images were evaluated with computer-aided detection. COMPARISON:  Previous exam(s). ACR Breast Density Category c: The breast tissue is heterogeneously dense, which may obscure small masses. FINDINGS: There are no findings suspicious for malignancy. IMPRESSION: No mammographic evidence of malignancy. A result letter of this screening mammogram will be mailed directly to the patient. RECOMMENDATION: Screening mammogram in one year. (Code:SM-B-01Y) BI-RADS CATEGORY  1: Negative. Electronically Signed   By: Valentino Saxon M.D.   On: 09/10/2021 16:51      Assessment & Plan:   Problem List Items Addressed This Visit     Aortic atherosclerosis ([place])    Continue lipitor.        Relevant Medications   triamterene-hydrochlorothiazide (MAXZIDE-25) 37.5-25 MG tablet   CKD (chronic kidney disease) stage 3, GFR 30-59 ml/min (HCC)    GFR - 56.   Stay hydrated.  Avoid antiinflammatories.  Follow metabolic panel.        Essential hypertension, benign    Blood pressure as outlined.  Continue losartan.  Given decreased potassium, will change hctz to  triam/hctz 37.5/25 - q day.  Stop potassium.  Follow pressures.  Follow metabolic panel.  Recheck potassium soon to confirm wnl.        Relevant Medications   triamterene-hydrochlorothiazide (MAXZIDE-25) 37.5-25 MG tablet   Hypercholesterolemia    Continue lipitor.  Low cholesterol diet and exercise.  Follow lipid panel and liver function tests.        Relevant Medications   triamterene-hydrochlorothiazide (MAXZIDE-25) 37.5-25 MG tablet   Hypokalemia - Primary    Given persistent slightly decreased potassium on potassium supplements, will stop hctz and start triam/hctz 37.5/25 q day.  Stop potassium supplements.  Recheck potassium soon to confirm wnl.        Relevant Orders   Potassium   Skin lesions    Being followed and treated by dermatology.        Type 2 diabetes mellitus with hyperglycemia (HCC)    Low carb diet and exercise given elevated blood sugars.  Follow met b and a1c. Recent check - 6.6. check urine microalb/cr ratio next labs.        Relevant Orders   Microalbumin / creatinine urine ratio     Einar Pheasant, MD

## 2022-04-04 NOTE — Patient Instructions (Signed)
Stop hydrochlorothiazide (HCTZ)  Stop potassium   Start Triam/hctz  37.5?25 - one per day.

## 2022-04-05 DIAGNOSIS — L989 Disorder of the skin and subcutaneous tissue, unspecified: Secondary | ICD-10-CM | POA: Insufficient documentation

## 2022-04-05 NOTE — Assessment & Plan Note (Signed)
Given persistent slightly decreased potassium on potassium supplements, will stop hctz and start triam/hctz 37.5/25 q day.  Stop potassium supplements.  Recheck potassium soon to confirm wnl.

## 2022-04-05 NOTE — Assessment & Plan Note (Signed)
Blood pressure as outlined.  Continue losartan.  Given decreased potassium, will change hctz to triam/hctz 37.5/25 - q day.  Stop potassium.  Follow pressures.  Follow metabolic panel.  Recheck potassium soon to confirm wnl.

## 2022-04-05 NOTE — Assessment & Plan Note (Signed)
Being followed and treated by dermatology.

## 2022-04-05 NOTE — Assessment & Plan Note (Signed)
GFR - 56.   Stay hydrated.  Avoid antiinflammatories.  Follow metabolic panel.

## 2022-04-05 NOTE — Assessment & Plan Note (Signed)
Low carb diet and exercise given elevated blood sugars.  Follow met b and a1c. Recent check - 6.6. check urine microalb/cr ratio next labs.

## 2022-04-05 NOTE — Assessment & Plan Note (Signed)
Continue lipitor.  Low cholesterol diet and exercise.  Follow lipid panel and liver function tests.   

## 2022-04-05 NOTE — Assessment & Plan Note (Signed)
Continue lipitor  ?

## 2022-04-07 ENCOUNTER — Telehealth: Payer: Self-pay | Admitting: Internal Medicine

## 2022-04-07 DIAGNOSIS — H16223 Keratoconjunctivitis sicca, not specified as Sjogren's, bilateral: Secondary | ICD-10-CM | POA: Diagnosis not present

## 2022-04-07 NOTE — Telephone Encounter (Signed)
Copied from [place] [phone]. Topic: Medicare AWV >> Apr 07, 2022  1:18 PM Devoria Glassing wrote: Reason for CRM: Left message for patient to schedule Annual Wellness Visit.  Please schedule with Nurse Health Advisor Denisa O'Brien-Blaney, LPN at [facility].  Please call [phone] ask for [facility]

## 2022-04-18 ENCOUNTER — Other Ambulatory Visit (INDEPENDENT_AMBULATORY_CARE_PROVIDER_SITE_OTHER): Payer: Medicare HMO

## 2022-04-18 DIAGNOSIS — E876 Hypokalemia: Secondary | ICD-10-CM | POA: Diagnosis not present

## 2022-04-18 DIAGNOSIS — E1165 Type 2 diabetes mellitus with hyperglycemia: Secondary | ICD-10-CM | POA: Diagnosis not present

## 2022-04-18 LAB — MICROALBUMIN / CREATININE URINE RATIO
Creatinine,U: 19.5 mg/dL
Microalb Creat Ratio: 3.6 mg/g (ref 0.0–30.0)
Microalb, Ur: <0.7 mg/dL (ref 0.0–1.9)

## 2022-04-18 LAB — POTASSIUM: Potassium: 3.8 mEq/L (ref 3.5–5.1)

## 2022-04-19 ENCOUNTER — Other Ambulatory Visit: Payer: Self-pay

## 2022-04-19 DIAGNOSIS — E876 Hypokalemia: Secondary | ICD-10-CM

## 2022-04-28 ENCOUNTER — Ambulatory Visit (INDEPENDENT_AMBULATORY_CARE_PROVIDER_SITE_OTHER): Payer: Medicare HMO | Admitting: Internal Medicine

## 2022-04-28 ENCOUNTER — Encounter: Payer: Self-pay | Admitting: Internal Medicine

## 2022-04-28 DIAGNOSIS — I10 Essential (primary) hypertension: Secondary | ICD-10-CM | POA: Diagnosis not present

## 2022-04-28 DIAGNOSIS — E1165 Type 2 diabetes mellitus with hyperglycemia: Secondary | ICD-10-CM | POA: Diagnosis not present

## 2022-04-28 DIAGNOSIS — S70369A Insect bite (nonvenomous), unspecified thigh, initial encounter: Secondary | ICD-10-CM

## 2022-04-28 DIAGNOSIS — E7800 Pure hypercholesterolemia, unspecified: Secondary | ICD-10-CM | POA: Diagnosis not present

## 2022-04-28 DIAGNOSIS — E876 Hypokalemia: Secondary | ICD-10-CM

## 2022-04-28 DIAGNOSIS — T7840XD Allergy, unspecified, subsequent encounter: Secondary | ICD-10-CM | POA: Diagnosis not present

## 2022-04-28 DIAGNOSIS — D649 Anemia, unspecified: Secondary | ICD-10-CM | POA: Diagnosis not present

## 2022-04-28 DIAGNOSIS — E079 Disorder of thyroid, unspecified: Secondary | ICD-10-CM

## 2022-04-28 DIAGNOSIS — W57XXXA Bitten or stung by nonvenomous insect and other nonvenomous arthropods, initial encounter: Secondary | ICD-10-CM

## 2022-04-28 DIAGNOSIS — I700 Atherosclerosis of aorta: Secondary | ICD-10-CM | POA: Diagnosis not present

## 2022-04-28 MED ORDER — DOXYCYCLINE HYCLATE 100 MG PO TABS: 100.0000 mg | ORAL_TABLET | Freq: Two times a day (BID) | ORAL | 0 refills | Status: DC

## 2022-04-28 NOTE — Progress Notes (Signed)
Patient ID: Christina Cole, female   DOB: [birth year], 81 y.o.   MRN: [id number]   Subjective:    Patient ID: Christina Cole, female    DOB: September 28, 1941, 81 y.o.   MRN: [id number]   Patient here for a scheduled follow up.   Chief Complaint  Patient presents with   Insect Bite    Tick   .   HPI Here to follow up regarding her blood pressure and cholesterol.  States she has been doing relatively well.  Stays active.  No chest pain or sob reported.  No abdominal pain.  Bowels stable.  Is concerned regarding tick bites.  Has found two ticks over this past week - attached to her legs.  (One on each side).  The bite on her right - surrounding erythema.  Ticks have been removed.  No fever.  No rash.  No joint aches.     Past Medical History:  Diagnosis Date   Heart murmur    Hyperlipidemia    Hypertension    Thyroid disease    goiter (follows with Dr. Ronnald Collum)   Past Surgical History:  Procedure Laterality Date   ABDOMINAL HYSTERECTOMY  1978   ovaries left in place   [place] Right 09/29/2017   Pt had Bx  done in [facility]'s office [place] @ 3:00 per chart-"FIBROCYSTIC CHANGES".   COLONOSCOPY WITH PROPOFOL N/A 07/11/2017   Procedure: COLONOSCOPY WITH PROPOFOL;  Surgeon: Lucilla Lame, MD;  Location: [facility] ENDOSCOPY;  Service: Endoscopy;  Laterality: N/A;   TUBAL LIGATION     Family History  Problem Relation Age of Onset   Stroke Mother    Hypertension Mother    Arthritis Mother    Breast cancer Sister 34   Breast cancer Sister 11   Lung cancer Brother    Diabetes Brother    Social History   Socioeconomic History   Marital status: Single    Spouse name: Not on file   Number of children: 3   Years of education: Not on file   Highest education level: Not on file  Occupational History   Not on file  Tobacco Use   Smoking status: Former   Smokeless tobacco: Never  Vaping Use   Vaping Use: Never used  Substance and Sexual Activity   Alcohol use: No     Alcohol/week: 0.0 standard drinks of alcohol   Drug use: No   Sexual activity: Not on file  Other Topics Concern   Not on file  Social History Narrative   Twin sister died 12/28/2021 and her brother died 12/28/2021    Social Determinants of Health   Financial Resource Strain: Not on file  Food Insecurity: Not on file  Transportation Needs: Not on file  Physical Activity: Not on file  Stress: Not on file  Social Connections: Not on file     Review of Systems  Constitutional:  Negative for appetite change, fever and unexpected weight change.  HENT:  Negative for congestion and sinus pressure.   Respiratory:  Negative for cough, chest tightness and shortness of breath.   Cardiovascular:  Negative for chest pain, palpitations and leg swelling.  Gastrointestinal:  Negative for abdominal pain, diarrhea, nausea and vomiting.  Genitourinary:  Negative for difficulty urinating and dysuria.  Musculoskeletal:  Negative for joint swelling and myalgias.  Skin:  Negative for color change and rash.       S/p tick bites - each leg.   Neurological:  Negative  for dizziness, light-headedness and headaches.  Psychiatric/Behavioral:  Negative for agitation and dysphoric mood.        Objective:     BP 120/62 (BP Location: Left Arm, Patient Position: Sitting, Cuff Size: Small)   Pulse 65   Temp 98.1 F (36.7 C) (Temporal)   Resp 15   Ht 5' 3" (1.6 m)   Wt 133 lb 12.8 oz (60.7 kg)   LMP 12/21/1976   SpO2 98%   BMI 23.70 kg/m  Wt Readings from Last 3 Encounters:  04/28/22 133 lb 12.8 oz (60.7 kg)  04/04/22 137 lb 9.6 oz (62.4 kg)  01/26/22 136 lb (61.7 kg)    Physical Exam Vitals reviewed.  Constitutional:      General: She is not in acute distress.    Appearance: Normal appearance.  HENT:     Head: Normocephalic and atraumatic.     Right Ear: External ear normal.     Left Ear: External ear normal.  Eyes:     General: No scleral icterus.       Right eye: No discharge.        Left  eye: No discharge.     Conjunctiva/sclera: Conjunctivae normal.  Neck:     Thyroid: No thyromegaly.  Cardiovascular:     Rate and Rhythm: Normal rate and regular rhythm.  Pulmonary:     Effort: No respiratory distress.     Breath sounds: Normal breath sounds. No wheezing.  Abdominal:     General: Bowel sounds are normal.     Palpations: Abdomen is soft.     Tenderness: There is no abdominal tenderness.  Musculoskeletal:        General: No swelling or tenderness.     Cervical back: Neck supple. No tenderness.  Lymphadenopathy:     Cervical: No cervical adenopathy.  Skin:    Findings: No rash.     Comments: Lesion - right inner thigh - surrounding erythema.  Left lesion - no surrounding erythema.  No other rash.   Neurological:     Mental Status: She is alert.  Psychiatric:        Mood and Affect: Mood normal.        Behavior: Behavior normal.      Outpatient Encounter Medications as of 04/28/2022  Medication Sig   atorvastatin (LIPITOR) 40 MG tablet TAKE 1 TABLET EVERY DAY   doxycycline (VIBRA-TABS) 100 MG tablet Take 1 tablet (100 mg total) by mouth 2 (two) times daily.   EPINEPHrine (EPIPEN 2-PAK) 0.3 mg/0.3 mL IJ SOAJ injection USE AS DIRECTED   hydrOXYzine (ATARAX) 25 MG tablet TAKE 1/2 TO 1 TABLET (12.5-25 MG TOTAL) BY MOUTH EVERY 8 (EIGHT) HOURS AS NEEDED.   levothyroxine (SYNTHROID, LEVOTHROID) 25 MCG tablet Take 25 mcg by mouth daily before breakfast.   losartan (COZAAR) 100 MG tablet TAKE 1 TABLET EVERY DAY   TIADYLT ER 360 MG 24 hr capsule TAKE 1 CAPSULE EVERY DAY   triamcinolone cream (KENALOG) 0.1 % Apply 1 application. topically 2 (two) times daily. Prn itching hives to body   triamterene-hydrochlorothiazide (MAXZIDE-25) 37.5-25 MG tablet Take 1 tablet by mouth daily.   No facility-administered encounter medications on file as of 04/28/2022.     Lab Results  Component Value Date   WBC 5.3 04/07/2021   HGB 12.1 04/07/2021   HCT 37.2 04/07/2021   PLT 196.0  04/07/2021   GLUCOSE 94 01/24/2022   CHOL 151 01/24/2022   TRIG 112.0 01/24/2022   HDL 58.50 01/24/2022  LDLCALC 70 01/24/2022   ALT 12 01/24/2022   AST 14 01/24/2022   NA 136 01/24/2022   K 3.8 04/18/2022   CL 99 01/24/2022   CREATININE 0.86 01/24/2022   BUN 9 01/24/2022   CO2 30 01/24/2022   TSH 1.08 09/28/2021   HGBA1C 6.6 (H) 01/24/2022   MICROALBUR <0.7 04/18/2022    MM 3D SCREEN BREAST BILATERAL  Result Date: 09/10/2021 CLINICAL DATA:  Screening. EXAM: DIGITAL SCREENING BILATERAL MAMMOGRAM WITH TOMOSYNTHESIS AND CAD TECHNIQUE: Bilateral screening digital craniocaudal and mediolateral oblique mammograms were obtained. Bilateral screening digital breast tomosynthesis was performed. The images were evaluated with computer-aided detection. COMPARISON:  Previous exam(s). ACR Breast Density Category c: The breast tissue is heterogeneously dense, which may obscure small masses. FINDINGS: There are no findings suspicious for malignancy. IMPRESSION: No mammographic evidence of malignancy. A result letter of this screening mammogram will be mailed directly to the patient. RECOMMENDATION: Screening mammogram in one year. (Code:SM-B-01Y) BI-RADS CATEGORY  1: Negative. Electronically Signed   By: Valentino Saxon M.D.   On: 09/10/2021 16:51      Assessment & Plan:   Problem List Items Addressed This Visit     Allergic reaction    Had some recent itching on her palms.  No other rash.  Per last note, planning appt with dermatologist/allergist.  Confirm.       Anemia    Follow cbc.       Aortic atherosclerosis (HCC)    Continue lipitor.       Essential hypertension, benign    Blood pressure as outlined. Continue losartan.  Last visit, changed hctz to triam/hctz 37.5/25 - q day.  Stopped potassium.  Blood pressure doing well.  Potassium just checked and wnl. Follow pressures.  Follow metabolic panel.      Hypercholesterolemia    Continue lipitor.  Low cholesterol diet and  exercise.  Follow lipid panel and liver function tests.       Hypokalemia    Recent potassium wnl.  hctz stopped.  Started on triam/hctz as outlined.  Off potassium.  Follow potassium level.       Thyroid disease    On thyroid replacement.  Follow tsh.       Tick bite    S/p tick bite on each leg.  Right leg lesion with surrounding erythema.  Treat with doxycycline as directed.  Probiotic as directed.  Follow.  Denies fever, headache, joint aches.        Type 2 diabetes mellitus with hyperglycemia (HCC)    Low carb diet and exercise given elevated blood sugars.  Follow met b and a1c. Recent check - 6.6.        Einar Pheasant, MD

## 2022-04-28 NOTE — Patient Instructions (Signed)
Examples of probiotics:  align, culturelle or florastor  Take a probiotic daily while on the antibiotic and for two weeks after completing antibiotics.

## 2022-04-30 ENCOUNTER — Encounter: Payer: Self-pay | Admitting: Internal Medicine

## 2022-04-30 DIAGNOSIS — W57XXXA Bitten or stung by nonvenomous insect and other nonvenomous arthropods, initial encounter: Secondary | ICD-10-CM | POA: Insufficient documentation

## 2022-04-30 NOTE — Assessment & Plan Note (Signed)
Continue lipitor  ?

## 2022-04-30 NOTE — Assessment & Plan Note (Signed)
On thyroid replacement.  Follow tsh.  

## 2022-04-30 NOTE — Assessment & Plan Note (Signed)
Continue lipitor.  Low cholesterol diet and exercise.  Follow lipid panel and liver function tests.   

## 2022-04-30 NOTE — Assessment & Plan Note (Addendum)
Low carb diet and exercise given elevated blood sugars.  Follow met b and a1c. Recent check - 6.6.

## 2022-04-30 NOTE — Assessment & Plan Note (Signed)
S/p tick bite on each leg.  Right leg lesion with surrounding erythema.  Treat with doxycycline as directed.  Probiotic as directed.  Follow.  Denies fever, headache, joint aches.

## 2022-04-30 NOTE — Assessment & Plan Note (Signed)
Follow cbc.  

## 2022-04-30 NOTE — Assessment & Plan Note (Signed)
Blood pressure as outlined. Continue losartan.  Last visit, changed hctz to triam/hctz 37.5/25 - q day.  Stopped potassium.  Blood pressure doing well.  Potassium just checked and wnl. Follow pressures.  Follow metabolic panel.

## 2022-04-30 NOTE — Assessment & Plan Note (Signed)
Recent potassium wnl.  hctz stopped.  Started on triam/hctz as outlined.  Off potassium.  Follow potassium level.

## 2022-04-30 NOTE — Assessment & Plan Note (Signed)
Had some recent itching on her palms.  No other rash.  Per last note, planning appt with dermatologist/allergist.  Confirm.

## 2022-05-10 ENCOUNTER — Other Ambulatory Visit (INDEPENDENT_AMBULATORY_CARE_PROVIDER_SITE_OTHER): Payer: Medicare HMO

## 2022-05-10 DIAGNOSIS — E876 Hypokalemia: Secondary | ICD-10-CM | POA: Diagnosis not present

## 2022-05-10 LAB — POTASSIUM: Potassium: 3.9 mEq/L (ref 3.5–5.1)

## 2022-06-09 ENCOUNTER — Telehealth: Payer: Self-pay

## 2022-06-09 ENCOUNTER — Ambulatory Visit (INDEPENDENT_AMBULATORY_CARE_PROVIDER_SITE_OTHER): Payer: Medicare HMO | Admitting: Family Medicine

## 2022-06-09 DIAGNOSIS — Z91199 Patient's noncompliance with other medical treatment and regimen due to unspecified reason: Secondary | ICD-10-CM

## 2022-06-09 NOTE — Progress Notes (Signed)
Both nurse assistant I called patient multiple times at all phone numbers listed and sent link for the visit but we we unable to reach the patient. Left multiple phone messages. Since unable to reach asked that she please call to reschedule. Did let her know on message that I have openings next Tuesday and Thursday and would be happy to see her then.

## 2022-06-09 NOTE — Telephone Encounter (Signed)
Attempted to reach patient several times this morning regards to awv. Will call patient again to reschedule.

## 2022-06-09 NOTE — Telephone Encounter (Signed)
Attempt to reach patient to r/s awv appointment. Left a voicemail to call back.

## 2022-06-13 ENCOUNTER — Telehealth: Payer: Self-pay

## 2022-06-13 NOTE — Telephone Encounter (Signed)
Attempt to reach patient regards to AWV for schedule. Left voicemail to call back.

## 2022-06-15 NOTE — Telephone Encounter (Signed)
Left a voicemail for patient to call back to r/s awv.

## 2022-06-24 ENCOUNTER — Telehealth: Payer: Self-pay | Admitting: *Deleted

## 2022-06-24 DIAGNOSIS — I10 Essential (primary) hypertension: Secondary | ICD-10-CM

## 2022-06-24 DIAGNOSIS — E079 Disorder of thyroid, unspecified: Secondary | ICD-10-CM

## 2022-06-24 DIAGNOSIS — N1831 Chronic kidney disease, stage 3a: Secondary | ICD-10-CM

## 2022-06-24 DIAGNOSIS — E1165 Type 2 diabetes mellitus with hyperglycemia: Secondary | ICD-10-CM

## 2022-06-24 DIAGNOSIS — E7800 Pure hypercholesterolemia, unspecified: Secondary | ICD-10-CM

## 2022-06-24 NOTE — Telephone Encounter (Signed)
Please place future orders for lab appt.  

## 2022-06-25 NOTE — Telephone Encounter (Signed)
Orders placed for f/u labs.  

## 2022-06-27 ENCOUNTER — Other Ambulatory Visit (INDEPENDENT_AMBULATORY_CARE_PROVIDER_SITE_OTHER): Payer: Medicare HMO

## 2022-06-27 DIAGNOSIS — N1831 Chronic kidney disease, stage 3a: Secondary | ICD-10-CM | POA: Diagnosis not present

## 2022-06-27 DIAGNOSIS — E1165 Type 2 diabetes mellitus with hyperglycemia: Secondary | ICD-10-CM | POA: Diagnosis not present

## 2022-06-27 DIAGNOSIS — E7800 Pure hypercholesterolemia, unspecified: Secondary | ICD-10-CM | POA: Diagnosis not present

## 2022-06-27 LAB — LIPID PANEL
Cholesterol: 174 mg/dL (ref 0–200)
HDL: 52.1 mg/dL (ref >39.00)
LDL Cholesterol: 101 mg/dL — ABNORMAL HIGH (ref 0–99)
NonHDL: 122.29
Total CHOL/HDL Ratio: 3
Triglycerides: 108 mg/dL (ref 0.0–149.0)
VLDL: 21.6 mg/dL (ref 0.0–40.0)

## 2022-06-27 LAB — CBC WITH DIFFERENTIAL/PLATELET
Basophils Absolute: 0 10*3/uL (ref 0.0–0.1)
Basophils Relative: 1.1 % (ref 0.0–3.0)
Eosinophils Absolute: 0.1 10*3/uL (ref 0.0–0.7)
Eosinophils Relative: 3.5 % (ref 0.0–5.0)
HCT: 37 % (ref 36.0–46.0)
Hemoglobin: 12.3 g/dL (ref 12.0–15.0)
Lymphocytes Relative: 48.4 % — ABNORMAL HIGH (ref 12.0–46.0)
Lymphs Abs: 2 10*3/uL (ref 0.7–4.0)
MCHC: 33.4 g/dL (ref 30.0–36.0)
MCV: 87.5 fl (ref 78.0–100.0)
Monocytes Absolute: 0.3 10*3/uL (ref 0.1–1.0)
Monocytes Relative: 7.9 % (ref 3.0–12.0)
Neutro Abs: 1.6 10*3/uL (ref 1.4–7.7)
Neutrophils Relative %: 39.1 % — ABNORMAL LOW (ref 43.0–77.0)
Platelets: 168 10*3/uL (ref 150.0–400.0)
RBC: 4.23 Mil/uL (ref 3.87–5.11)
RDW: 14.7 % (ref 11.5–15.5)
WBC: 4.1 10*3/uL (ref 4.0–10.5)

## 2022-06-27 LAB — HEPATIC FUNCTION PANEL
ALT: 10 U/L (ref 0–35)
AST: 12 U/L (ref 0–37)
Albumin: 4.1 g/dL (ref 3.5–5.2)
Alkaline Phosphatase: 55 U/L (ref 39–117)
Bilirubin, Direct: 0.1 mg/dL (ref 0.0–0.3)
Total Bilirubin: 0.7 mg/dL (ref 0.2–1.2)
Total Protein: 7 g/dL (ref 6.0–8.3)

## 2022-06-27 LAB — BASIC METABOLIC PANEL
BUN: 14 mg/dL (ref 6–23)
CO2: 29 mEq/L (ref 19–32)
Calcium: 9.3 mg/dL (ref 8.4–10.5)
Chloride: 98 mEq/L (ref 96–112)
Creatinine, Ser: 1.07 mg/dL (ref 0.40–1.20)
GFR: 49.54 mL/min — ABNORMAL LOW (ref >60.00)
Glucose, Bld: 89 mg/dL (ref 70–99)
Potassium: 3.7 mEq/L (ref 3.5–5.1)
Sodium: 137 mEq/L (ref 135–145)

## 2022-06-27 LAB — HEMOGLOBIN A1C: Hgb A1c MFr Bld: 6.8 % — ABNORMAL HIGH (ref 4.6–6.5)

## 2022-06-29 ENCOUNTER — Ambulatory Visit (INDEPENDENT_AMBULATORY_CARE_PROVIDER_SITE_OTHER): Payer: Medicare HMO | Admitting: Internal Medicine

## 2022-06-29 ENCOUNTER — Encounter: Payer: Self-pay | Admitting: Internal Medicine

## 2022-06-29 DIAGNOSIS — Z8371 Family history of colonic polyps: Secondary | ICD-10-CM | POA: Diagnosis not present

## 2022-06-29 DIAGNOSIS — E079 Disorder of thyroid, unspecified: Secondary | ICD-10-CM

## 2022-06-29 DIAGNOSIS — E876 Hypokalemia: Secondary | ICD-10-CM

## 2022-06-29 DIAGNOSIS — I10 Essential (primary) hypertension: Secondary | ICD-10-CM | POA: Diagnosis not present

## 2022-06-29 DIAGNOSIS — E7800 Pure hypercholesterolemia, unspecified: Secondary | ICD-10-CM | POA: Diagnosis not present

## 2022-06-29 DIAGNOSIS — E1165 Type 2 diabetes mellitus with hyperglycemia: Secondary | ICD-10-CM

## 2022-06-29 DIAGNOSIS — Z83719 Family history of colon polyps, unspecified: Secondary | ICD-10-CM

## 2022-06-29 DIAGNOSIS — N1831 Chronic kidney disease, stage 3a: Secondary | ICD-10-CM

## 2022-06-29 DIAGNOSIS — I700 Atherosclerosis of aorta: Secondary | ICD-10-CM | POA: Diagnosis not present

## 2022-06-29 MED ORDER — TRIAMTERENE-HCTZ 37.5-25 MG PO TABS
1.0000 | ORAL_TABLET | Freq: Every day | ORAL | 1 refills | Status: DC
Start: 2022-06-29 — End: 2022-08-12

## 2022-06-29 NOTE — Progress Notes (Signed)
Patient ID: Christina Cole, female   DOB: [birth year], 81 y.o.   MRN: [id number]   Subjective:    Patient ID: Christina Cole, female    DOB: 09-14-1941, 81 y.o.   MRN: [id number]   Patient here for  Chief Complaint  Patient presents with   Follow-up    2 mon, labs done 8/28. Refill triamterene    .   HPI Reports she is doing well.  Stays active.  Here to follow up regarding her blood pressure and potassium.  No chest pain.  Breathing stable. No increased cough or congestion.  No acid reflux reported.  No abdominal pain.  Bowels moving.    Past Medical History:  Diagnosis Date   Heart murmur    Hyperlipidemia    Hypertension    Thyroid disease    goiter (follows with Dr. Ronnald Collum)   Past Surgical History:  Procedure Laterality Date   ABDOMINAL HYSTERECTOMY  1978   ovaries left in place   [place] Right 09/29/2017   Pt had Bx  done in Byr[facility]'s office [place] @ 3:00 per chart-"FIBROCYSTIC CHANGES".   COLONOSCOPY WITH PROPOFOL N/A 07/11/2017   Procedure: COLONOSCOPY WITH PROPOFOL;  Surgeon: Lucilla Lame, MD;  Location: [facility] ENDOSCOPY;  Service: Endoscopy;  Laterality: N/A;   TUBAL LIGATION     Family History  Problem Relation Age of Onset   Stroke Mother    Hypertension Mother    Arthritis Mother    Breast cancer Sister 69   Breast cancer Sister 76   Lung cancer Brother    Diabetes Brother    Social History   Socioeconomic History   Marital status: Single    Spouse name: Not on file   Number of children: 3   Years of education: Not on file   Highest education level: Not on file  Occupational History   Not on file  Tobacco Use   Smoking status: Former   Smokeless tobacco: Never  Vaping Use   Vaping Use: Never used  Substance and Sexual Activity   Alcohol use: No    Alcohol/week: 0.0 standard drinks of alcohol   Drug use: No   Sexual activity: Not on file  Other Topics Concern   Not on file  Social History Narrative   Twin sister  died 2021/12/23 and her brother died 2021/12/23    Social Determinants of Health   Financial Resource Strain: Not on file  Food Insecurity: Not on file  Transportation Needs: Not on file  Physical Activity: Not on file  Stress: Not on file  Social Connections: Not on file     Review of Systems  Constitutional:  Negative for appetite change, fatigue and unexpected weight change.  HENT:  Negative for congestion and sinus pressure.   Respiratory:  Negative for cough, chest tightness and shortness of breath.   Cardiovascular:  Negative for chest pain, palpitations and leg swelling.  Gastrointestinal:  Negative for abdominal pain, diarrhea, nausea and vomiting.  Genitourinary:  Negative for difficulty urinating and dysuria.  Musculoskeletal:  Negative for joint swelling and myalgias.  Skin:  Negative for color change and rash.  Neurological:  Negative for dizziness, light-headedness and headaches.  Psychiatric/Behavioral:  Negative for agitation and dysphoric mood.        Objective:     BP 120/60 (BP Location: Left Arm, Patient Position: Sitting, Cuff Size: Normal)   Pulse 67   Temp 97.9 F (36.6 C) (Oral)   Resp 14  Ht '5\' 3"'  (1.6 m)   Wt 134 lb 12.8 oz (61.1 kg)   LMP 12/21/1976   SpO2 96%   BMI 23.88 kg/m  Wt Readings from Last 3 Encounters:  06/29/22 134 lb 12.8 oz (61.1 kg)  04/28/22 133 lb 12.8 oz (60.7 kg)  04/04/22 137 lb 9.6 oz (62.4 kg)    Physical Exam Vitals reviewed.  Constitutional:      General: She is not in acute distress.    Appearance: Normal appearance.  HENT:     Head: Normocephalic and atraumatic.     Right Ear: External ear normal.     Left Ear: External ear normal.  Eyes:     General: No scleral icterus.       Right eye: No discharge.        Left eye: No discharge.     Conjunctiva/sclera: Conjunctivae normal.  Neck:     Thyroid: No thyromegaly.  Cardiovascular:     Rate and Rhythm: Normal rate and regular rhythm.  Pulmonary:     Effort: No  respiratory distress.     Breath sounds: Normal breath sounds. No wheezing.  Abdominal:     General: Bowel sounds are normal.     Palpations: Abdomen is soft.     Tenderness: There is no abdominal tenderness.  Musculoskeletal:        General: No swelling or tenderness.     Cervical back: Neck supple. No tenderness.  Lymphadenopathy:     Cervical: No cervical adenopathy.  Skin:    Findings: No erythema or rash.  Neurological:     Mental Status: She is alert.  Psychiatric:        Mood and Affect: Mood normal.        Behavior: Behavior normal.      Outpatient Encounter Medications as of 06/29/2022  Medication Sig   atorvastatin (LIPITOR) 40 MG tablet TAKE 1 TABLET EVERY DAY   hydrOXYzine (ATARAX) 25 MG tablet TAKE 1/2 TO 1 TABLET (12.5-25 MG TOTAL) BY MOUTH EVERY 8 (EIGHT) HOURS AS NEEDED.   levothyroxine (SYNTHROID, LEVOTHROID) 25 MCG tablet Take 25 mcg by mouth daily before breakfast.   losartan (COZAAR) 100 MG tablet TAKE 1 TABLET EVERY DAY   TIADYLT ER 360 MG 24 hr capsule TAKE 1 CAPSULE EVERY DAY   [DISCONTINUED] EPINEPHrine (EPIPEN 2-PAK) 0.3 mg/0.3 mL IJ SOAJ injection USE AS DIRECTED   [DISCONTINUED] triamterene-hydrochlorothiazide (MAXZIDE-25) 37.5-25 MG tablet Take 1 tablet by mouth daily.   triamterene-hydrochlorothiazide (MAXZIDE-25) 37.5-25 MG tablet Take 1 tablet by mouth daily.   [DISCONTINUED] doxycycline (VIBRA-TABS) 100 MG tablet Take 1 tablet (100 mg total) by mouth 2 (two) times daily. (Patient not taking: Reported on 06/29/2022)   [DISCONTINUED] triamcinolone cream (KENALOG) 0.1 % Apply 1 application. topically 2 (two) times daily. Prn itching hives to body (Patient not taking: Reported on 06/29/2022)   No facility-administered encounter medications on file as of 06/29/2022.     Lab Results  Component Value Date   WBC 4.1 06/27/2022   HGB 12.3 06/27/2022   HCT 37.0 06/27/2022   PLT 168.0 06/27/2022   GLUCOSE 89 06/27/2022   CHOL 174 06/27/2022   TRIG 108.0  06/27/2022   HDL 52.10 06/27/2022   LDLCALC 101 (H) 06/27/2022   ALT 10 06/27/2022   AST 12 06/27/2022   NA 137 06/27/2022   K 3.7 06/27/2022   CL 98 06/27/2022   CREATININE 1.07 06/27/2022   BUN 14 06/27/2022   CO2 29 06/27/2022  TSH 1.08 09/28/2021   HGBA1C 6.8 (H) 06/27/2022   MICROALBUR <0.7 04/18/2022    MM 3D SCREEN BREAST BILATERAL  Result Date: 09/10/2021 CLINICAL DATA:  Screening. EXAM: DIGITAL SCREENING BILATERAL MAMMOGRAM WITH TOMOSYNTHESIS AND CAD TECHNIQUE: Bilateral screening digital craniocaudal and mediolateral oblique mammograms were obtained. Bilateral screening digital breast tomosynthesis was performed. The images were evaluated with computer-aided detection. COMPARISON:  Previous exam(s). ACR Breast Density Category c: The breast tissue is heterogeneously dense, which may obscure small masses. FINDINGS: There are no findings suspicious for malignancy. IMPRESSION: No mammographic evidence of malignancy. A result letter of this screening mammogram will be mailed directly to the patient. RECOMMENDATION: Screening mammogram in one year. (Code:SM-B-01Y) BI-RADS CATEGORY  1: Negative. Electronically Signed   By: Valentino Saxon M.D.   On: 09/10/2021 16:51      Assessment & Plan:   Problem List Items Addressed This Visit     Aortic atherosclerosis ([place])    Continue lipitor.       Relevant Medications   triamterene-hydrochlorothiazide (MAXZIDE-25) 37.5-25 MG tablet   CKD (chronic kidney disease) stage 3, GFR 30-59 ml/min (HCC)    Recent GFR 49.  Stay hydrated. Avoid antiinflammatories.  Continue losartan.  Follow metabolic panel.       Essential hypertension, benign    Blood pressure as outlined. Continue losartan and triam/hctz 37.5/25 - q day.  Stopped potassium.  Blood pressure doing well. Follow pressures.  Follow metabolic panel. Check today.       Relevant Medications   triamterene-hydrochlorothiazide (MAXZIDE-25) 37.5-25 MG tablet   Family history of  polyps in the colon    Had colonoscopy 07/2017.  Recommended f/u in 5 years.        Hypercholesterolemia    Continue lipitor.  Low cholesterol diet and exercise.  Follow lipid panel and liver function tests.       Relevant Medications   triamterene-hydrochlorothiazide (MAXZIDE-25) 37.5-25 MG tablet   Hypokalemia    On triam/hctz now.  Off potassium.  Check metabolic panel.       Thyroid disease    On thyroid replacement.  Follow tsh.       Type 2 diabetes mellitus with hyperglycemia (HCC)    Low carb diet and exercise given elevated blood sugars.  Follow met b and a1c. Recent check - 6.6.        Einar Pheasant, MD

## 2022-06-30 ENCOUNTER — Telehealth: Payer: Self-pay

## 2022-06-30 NOTE — Telephone Encounter (Signed)
Patient contacted office to schedule her repeat colonoscopy with Dr. Allen Norris.  Last colonoscopy performed 07/11/17.  Colonoscopy Impression noted:   - Two 3 to 4 mm polyps in the transverse colon, removed with a cold biopsy forceps. . - One 3 mm polyp in the rectum, removed with a cold biopsy forceps.  - One 8 mm polyp in the sigmoid colon, removed with a hot snare. Resected and retrieved. Clip (MR conditional) was placed.  Recommended repeat in 5 years.  She is 81 years old.  Please advise as to if I can triage by phone and schedule for repeat colonoscopy or would you prefer an office visit.  Thanks,  [place], [place]

## 2022-07-01 ENCOUNTER — Telehealth: Payer: Self-pay

## 2022-07-01 NOTE — Telephone Encounter (Signed)
Dr. Allen Norris advised if patient is healthy and doing well then we could go ahead and schedule her colonoscopy.  If not then she should not have it done.  Patient said she is not up to having it.  Has been advised that she does need to have it done.  Thanks,  [place], [place]

## 2022-07-07 ENCOUNTER — Telehealth: Payer: Self-pay

## 2022-07-07 ENCOUNTER — Other Ambulatory Visit: Payer: Self-pay | Admitting: Internal Medicine

## 2022-07-07 ENCOUNTER — Telehealth: Payer: Self-pay | Admitting: Internal Medicine

## 2022-07-07 DIAGNOSIS — Z91030 Bee allergy status: Secondary | ICD-10-CM

## 2022-07-07 DIAGNOSIS — N1831 Chronic kidney disease, stage 3a: Secondary | ICD-10-CM

## 2022-07-07 NOTE — Telephone Encounter (Signed)
Patient states Dr. Einar Pheasant is going to put her on a new medication.  Patient states her preferred pharmacy is [facility] on [address] in [place].

## 2022-07-07 NOTE — Telephone Encounter (Signed)
We had discussed her cholesterol last visit.  She has been on lipitor.  If she is almost out of lipitor, can send in rx for crestor '40mg'$  q day.  Cancel rx for lipitor and start crestor '40mg'$  q day.  Check liver panel 6 weeks after starting crestor.

## 2022-07-07 NOTE — Telephone Encounter (Signed)
Patient has a lab appt 07/13/22, there are no orders in.

## 2022-07-07 NOTE — Telephone Encounter (Signed)
LVM to call back to office  

## 2022-07-08 NOTE — Addendum Note (Signed)
Addended by: Alisa Graff on: 07/08/2022 03:49 AM   Modules accepted: Orders

## 2022-07-08 NOTE — Telephone Encounter (Signed)
Lvm for pt to return call.

## 2022-07-08 NOTE — Telephone Encounter (Signed)
Orders placed for future labs.  

## 2022-07-10 ENCOUNTER — Encounter: Payer: Self-pay | Admitting: Internal Medicine

## 2022-07-10 NOTE — Assessment & Plan Note (Signed)
Recent GFR 49.  Stay hydrated. Avoid antiinflammatories.  Continue losartan.  Follow metabolic panel.

## 2022-07-10 NOTE — Assessment & Plan Note (Signed)
Had colonoscopy 07/2017.  Recommended f/u in 5 years.

## 2022-07-10 NOTE — Assessment & Plan Note (Signed)
Continue lipitor.  Low cholesterol diet and exercise.  Follow lipid panel and liver function tests.   

## 2022-07-10 NOTE — Assessment & Plan Note (Signed)
On triam/hctz now.  Off potassium.  Check metabolic panel.

## 2022-07-10 NOTE — Assessment & Plan Note (Signed)
On thyroid replacement.  Follow tsh.  

## 2022-07-10 NOTE — Assessment & Plan Note (Signed)
Continue lipitor  ?

## 2022-07-10 NOTE — Assessment & Plan Note (Signed)
Low carb diet and exercise given elevated blood sugars.  Follow met b and a1c. Recent check - 6.6.

## 2022-07-10 NOTE — Assessment & Plan Note (Addendum)
Blood pressure as outlined. Continue losartan and triam/hctz 37.5/25 - q day.  Stopped potassium.  Blood pressure doing well. Follow pressures.  Follow metabolic panel. Check today.

## 2022-07-11 NOTE — Telephone Encounter (Signed)
Called pt x2 lvm x2 for pt to return call. 

## 2022-07-13 ENCOUNTER — Other Ambulatory Visit (INDEPENDENT_AMBULATORY_CARE_PROVIDER_SITE_OTHER): Payer: Medicare HMO

## 2022-07-13 DIAGNOSIS — N1831 Chronic kidney disease, stage 3a: Secondary | ICD-10-CM | POA: Diagnosis not present

## 2022-07-13 LAB — URINALYSIS, ROUTINE W REFLEX MICROSCOPIC
Bilirubin Urine: NEGATIVE
Hgb urine dipstick: NEGATIVE
Ketones, ur: NEGATIVE
Leukocytes,Ua: NEGATIVE
Nitrite: NEGATIVE
Specific Gravity, Urine: <=1.005 — AB (ref 1.000–1.030)
Total Protein, Urine: NEGATIVE
Urine Glucose: NEGATIVE
Urobilinogen, UA: 0.2 (ref 0.0–1.0)
pH: 6 (ref 5.0–8.0)

## 2022-07-13 LAB — BASIC METABOLIC PANEL
BUN: 22 mg/dL (ref 6–23)
CO2: 28 mEq/L (ref 19–32)
Calcium: 9.7 mg/dL (ref 8.4–10.5)
Chloride: 99 mEq/L (ref 96–112)
Creatinine, Ser: 1.22 mg/dL — ABNORMAL HIGH (ref 0.40–1.20)
GFR: 42.31 mL/min — ABNORMAL LOW (ref >60.00)
Glucose, Bld: 123 mg/dL — ABNORMAL HIGH (ref 70–99)
Potassium: 3.3 mEq/L — ABNORMAL LOW (ref 3.5–5.1)
Sodium: 137 mEq/L (ref 135–145)

## 2022-08-03 ENCOUNTER — Other Ambulatory Visit: Payer: Self-pay | Admitting: Internal Medicine

## 2022-08-03 DIAGNOSIS — Z1231 Encounter for screening mammogram for malignant neoplasm of breast: Secondary | ICD-10-CM

## 2022-08-11 ENCOUNTER — Encounter: Payer: Self-pay | Admitting: Internal Medicine

## 2022-08-11 ENCOUNTER — Ambulatory Visit (INDEPENDENT_AMBULATORY_CARE_PROVIDER_SITE_OTHER): Payer: Medicare HMO | Admitting: Internal Medicine

## 2022-08-11 VITALS — BP 102/50 | HR 60 | Temp 98.3°F | Ht 63.0 in | Wt 134.8 lb

## 2022-08-11 DIAGNOSIS — I700 Atherosclerosis of aorta: Secondary | ICD-10-CM | POA: Diagnosis not present

## 2022-08-11 DIAGNOSIS — E1165 Type 2 diabetes mellitus with hyperglycemia: Secondary | ICD-10-CM

## 2022-08-11 DIAGNOSIS — I10 Essential (primary) hypertension: Secondary | ICD-10-CM | POA: Diagnosis not present

## 2022-08-11 DIAGNOSIS — N1831 Chronic kidney disease, stage 3a: Secondary | ICD-10-CM | POA: Diagnosis not present

## 2022-08-11 DIAGNOSIS — E079 Disorder of thyroid, unspecified: Secondary | ICD-10-CM

## 2022-08-11 DIAGNOSIS — Z83719 Family history of colon polyps, unspecified: Secondary | ICD-10-CM

## 2022-08-11 DIAGNOSIS — E7800 Pure hypercholesterolemia, unspecified: Secondary | ICD-10-CM

## 2022-08-11 LAB — BASIC METABOLIC PANEL
BUN: 15 mg/dL (ref 6–23)
CO2: 27 mEq/L (ref 19–32)
Calcium: 9.5 mg/dL (ref 8.4–10.5)
Chloride: 103 mEq/L (ref 96–112)
Creatinine, Ser: 1.1 mg/dL (ref 0.40–1.20)
GFR: 47.88 mL/min — ABNORMAL LOW (ref >60.00)
Glucose, Bld: 140 mg/dL — ABNORMAL HIGH (ref 70–99)
Potassium: 3.5 mEq/L (ref 3.5–5.1)
Sodium: 139 mEq/L (ref 135–145)

## 2022-08-11 NOTE — Progress Notes (Signed)
Subjective:    Patient ID: Christina Cole, female    DOB: 08-Dec-1940, 81 y.o.   MRN: [id number]   Patient here for  Chief Complaint  Patient presents with   Follow-up    Follow up BP and Potassium    .   HPI Here for f/u of her blood pressure.  Recently decreased triam/hctz to 1/2 tablet per day. Reviewed outside blood pressures - most averaging 120s/60s.  She feels better on the lower dose.  No chest pain.  Breathing stable.  No acid reflux reported.  No abdominal pain.  Bowels moving. Due colonoscopy.  Last 07/2017.    Past Medical History:  Diagnosis Date   Heart murmur    Hyperlipidemia    Hypertension    Thyroid disease    goiter (follows with Dr. Ronnald Collum)   Past Surgical History:  Procedure Laterality Date   ABDOMINAL HYSTERECTOMY  1978   ovaries left in place   [place] Right 09/29/2017   Pt had Bx  done in [facility]'s office [place] @ 3:00 per chart-"FIBROCYSTIC CHANGES".   COLONOSCOPY WITH PROPOFOL N/A 07/11/2017   Procedure: COLONOSCOPY WITH PROPOFOL;  Surgeon: Lucilla Lame, MD;  Location: [facility] ENDOSCOPY;  Service: Endoscopy;  Laterality: N/A;   TUBAL LIGATION     Family History  Problem Relation Age of Onset   Stroke Mother    Hypertension Mother    Arthritis Mother    Breast cancer Sister 52   Breast cancer Sister 14   Lung cancer Brother    Diabetes Brother    Social History   Socioeconomic History   Marital status: Single    Spouse name: Not on file   Number of children: 3   Years of education: Not on file   Highest education level: Not on file  Occupational History   Not on file  Tobacco Use   Smoking status: Former   Smokeless tobacco: Never  Vaping Use   Vaping Use: Never used  Substance and Sexual Activity   Alcohol use: No    Alcohol/week: 0.0 standard drinks of alcohol   Drug use: No   Sexual activity: Not on file  Other Topics Concern   Not on file  Social History Narrative   Twin sister died 11/26/21 and  her brother died 2021-11-26    Social Determinants of Health   Financial Resource Strain: Not on file  Food Insecurity: Not on file  Transportation Needs: Not on file  Physical Activity: Not on file  Stress: Not on file  Social Connections: Not on file     Review of Systems  Constitutional:  Negative for appetite change and unexpected weight change.  HENT:  Negative for congestion and sinus pressure.   Respiratory:  Negative for cough, chest tightness and shortness of breath.   Cardiovascular:  Negative for chest pain, palpitations and leg swelling.  Gastrointestinal:  Negative for abdominal pain, diarrhea, nausea and vomiting.  Genitourinary:  Negative for difficulty urinating and dysuria.  Musculoskeletal:  Negative for joint swelling and myalgias.  Skin:  Negative for color change and rash.  Neurological:  Negative for dizziness, light-headedness and headaches.  Psychiatric/Behavioral:  Negative for agitation and dysphoric mood.        Objective:     BP (!) 102/50 (BP Location: Left Arm, Patient Position: Sitting, Cuff Size: Normal)   Pulse 60   Temp 98.3 F (36.8 C) (Oral)   Ht _0  (1.6 m)   Wt  134 lb 12.8 oz (61.1 kg)   LMP 12/21/1976   SpO2 97%   BMI 23.88 kg/m  Wt Readings from Last 3 Encounters:  08/11/22 134 lb 12.8 oz (61.1 kg)  06/29/22 134 lb 12.8 oz (61.1 kg)  04/28/22 133 lb 12.8 oz (60.7 kg)    Physical Exam Vitals reviewed.  Constitutional:      General: She is not in acute distress.    Appearance: Normal appearance.  HENT:     Head: Normocephalic and atraumatic.     Right Ear: External ear normal.     Left Ear: External ear normal.  Eyes:     General: No scleral icterus.       Right eye: No discharge.        Left eye: No discharge.     Conjunctiva/sclera: Conjunctivae normal.  Neck:     Thyroid: No thyromegaly.  Cardiovascular:     Rate and Rhythm: Normal rate and regular rhythm.  Pulmonary:     Effort: No respiratory distress.      Breath sounds: Normal breath sounds. No wheezing.  Abdominal:     General: Bowel sounds are normal.     Palpations: Abdomen is soft.     Tenderness: There is no abdominal tenderness.  Musculoskeletal:        General: No swelling or tenderness.     Cervical back: Neck supple. No tenderness.  Lymphadenopathy:     Cervical: No cervical adenopathy.  Skin:    Findings: No erythema or rash.  Neurological:     Mental Status: She is alert.  Psychiatric:        Mood and Affect: Mood normal.        Behavior: Behavior normal.      Outpatient Encounter Medications as of 08/11/2022  Medication Sig   atorvastatin (LIPITOR) 40 MG tablet TAKE 1 TABLET EVERY DAY   EPINEPHRINE 0.3 mg/0.3 mL IJ SOAJ injection USE AS DIRECTED   hydrOXYzine (ATARAX) 25 MG tablet TAKE 1/2 TO 1 TABLET (12.5-25 MG TOTAL) BY MOUTH EVERY 8 (EIGHT) HOURS AS NEEDED.   levothyroxine (SYNTHROID, LEVOTHROID) 25 MCG tablet Take 25 mcg by mouth daily before breakfast.   losartan (COZAAR) 100 MG tablet TAKE 1 TABLET EVERY DAY   TIADYLT ER 360 MG 24 hr capsule TAKE 1 CAPSULE EVERY DAY   [DISCONTINUED] triamterene-hydrochlorothiazide (MAXZIDE-25) 37.5-25 MG tablet Take 1 tablet by mouth daily.   No facility-administered encounter medications on file as of 08/11/2022.     Lab Results  Component Value Date   WBC 4.1 06/27/2022   HGB 12.3 06/27/2022   HCT 37.0 06/27/2022   PLT 168.0 06/27/2022   GLUCOSE 140 (H) 08/11/2022   CHOL 174 06/27/2022   TRIG 108.0 06/27/2022   HDL 52.10 06/27/2022   LDLCALC 101 (H) 06/27/2022   ALT 10 06/27/2022   AST 12 06/27/2022   NA 139 08/11/2022   K 3.5 08/11/2022   CL 103 08/11/2022   CREATININE 1.10 08/11/2022   BUN 15 08/11/2022   CO2 27 08/11/2022   TSH 1.08 09/28/2021   HGBA1C 6.8 (H) 06/27/2022   MICROALBUR <0.7 04/18/2022    MM 3D SCREEN BREAST BILATERAL  Result Date: 09/10/2021 CLINICAL DATA:  Screening. EXAM: DIGITAL SCREENING BILATERAL MAMMOGRAM WITH TOMOSYNTHESIS AND  CAD TECHNIQUE: Bilateral screening digital craniocaudal and mediolateral oblique mammograms were obtained. Bilateral screening digital breast tomosynthesis was performed. The images were evaluated with computer-aided detection. COMPARISON:  Previous exam(s). ACR Breast Density Category c: The breast tissue  is heterogeneously dense, which may obscure small masses. FINDINGS: There are no findings suspicious for malignancy. IMPRESSION: No mammographic evidence of malignancy. A result letter of this screening mammogram will be mailed directly to the patient. RECOMMENDATION: Screening mammogram in one year. (Code:SM-B-01Y) BI-RADS CATEGORY  1: Negative. Electronically Signed   By: Valentino Saxon M.D.   On: 09/10/2021 16:51      Assessment & Plan:   Problem List Items Addressed This Visit     Aortic atherosclerosis ([place])    Continue lipitor.       CKD (chronic kidney disease) stage 3, GFR 30-59 ml/min (HCC)    Stay hydrated. Avoid antiinflammatories.  Continue losartan.  Follow metabolic panel.       Relevant Orders   Basic metabolic panel (Completed)   Essential hypertension, benign - Primary    Blood pressure as outlined. Continue losartan and triam/hctz 37.5/25 - 1/2 tablet q day.  Stopped potassium.  Blood pressure doing well. Follow pressures.  Follow metabolic panel. Check potassium today.       Family history of polyps in the colon    Had colonoscopy 07/2017.  Recommended f/u in 5 years.        Hypercholesterolemia    Continue lipitor.  Low cholesterol diet and exercise.  Follow lipid panel and liver function tests.       Relevant Orders   Hepatic function panel   Lipid panel   Thyroid disease    On thyroid replacement.  Follow tsh.       Relevant Orders   TSH   Type 2 diabetes mellitus with hyperglycemia (HCC)    Low carb diet and exercise given elevated blood sugars.  Follow met b and a1c.  Lab Results  Component Value Date   HGBA1C 6.8 (H) 06/27/2022        Relevant Orders   Hemoglobin Q2I   Basic metabolic panel     Einar Pheasant, MD

## 2022-08-12 ENCOUNTER — Other Ambulatory Visit: Payer: Self-pay

## 2022-08-12 MED ORDER — TRIAMTERENE-HCTZ 37.5-25 MG PO TABS: ORAL_TABLET | ORAL | 1 refills | Status: DC

## 2022-08-15 ENCOUNTER — Telehealth: Payer: Self-pay | Admitting: Internal Medicine

## 2022-08-15 ENCOUNTER — Encounter: Payer: Self-pay | Admitting: Internal Medicine

## 2022-08-15 NOTE — Assessment & Plan Note (Signed)
On thyroid replacement.  Follow tsh.  

## 2022-08-15 NOTE — Assessment & Plan Note (Signed)
Continue lipitor.  Low cholesterol diet and exercise.  Follow lipid panel and liver function tests.   

## 2022-08-15 NOTE — Telephone Encounter (Signed)
Spoke w/pt she stated she called to scheduled and they told her if she was not having any problems then they didn't find it neccessary to do another one. If she starts to have problems they will get her scheduled but she stated she is doing fine and that she did try and scheduled but that is what they advised her.

## 2022-08-15 NOTE — Assessment & Plan Note (Signed)
Continue lipitor  ?

## 2022-08-15 NOTE — Assessment & Plan Note (Signed)
Had colonoscopy 07/2017.  Recommended f/u in 5 years.

## 2022-08-15 NOTE — Assessment & Plan Note (Signed)
Stay hydrated. Avoid antiinflammatories.  Continue losartan.  Follow metabolic panel.  

## 2022-08-15 NOTE — Telephone Encounter (Signed)
Notify Christina Cole that her last colonoscopy was 07/2017.  Had recommended f/u in 5 years.  See if agreeable to referral back for evaluation of f/u colonoscopy.

## 2022-08-15 NOTE — Assessment & Plan Note (Addendum)
Blood pressure as outlined. Continue losartan and triam/hctz 37.5/25 - 1/2 tablet q day.  Stopped potassium.  Blood pressure doing well. Follow pressures.  Follow metabolic panel. Check potassium today.

## 2022-08-15 NOTE — Assessment & Plan Note (Signed)
Low carb diet and exercise given elevated blood sugars.  Follow met b and a1c.  Lab Results  Component Value Date   HGBA1C 6.8 (H) 06/27/2022

## 2022-09-12 ENCOUNTER — Ambulatory Visit
Admission: RE | Admit: 2022-09-12 | Discharge: 2022-09-12 | Disposition: A | Discharge status: Home / Self Care | Payer: Medicare HMO | Source: Ambulatory Visit | Attending: Internal Medicine | Admitting: Internal Medicine

## 2022-09-12 DIAGNOSIS — Z1231 Encounter for screening mammogram for malignant neoplasm of breast: Secondary | ICD-10-CM | POA: Diagnosis not present

## 2022-10-04 ENCOUNTER — Other Ambulatory Visit (INDEPENDENT_AMBULATORY_CARE_PROVIDER_SITE_OTHER): Payer: Medicare HMO

## 2022-10-04 DIAGNOSIS — E7800 Pure hypercholesterolemia, unspecified: Secondary | ICD-10-CM | POA: Diagnosis not present

## 2022-10-04 DIAGNOSIS — E079 Disorder of thyroid, unspecified: Secondary | ICD-10-CM | POA: Diagnosis not present

## 2022-10-04 DIAGNOSIS — E1165 Type 2 diabetes mellitus with hyperglycemia: Secondary | ICD-10-CM | POA: Diagnosis not present

## 2022-10-04 LAB — LIPID PANEL
Cholesterol: 152 mg/dL (ref 0–200)
HDL: 56.6 mg/dL (ref >39.00)
LDL Cholesterol: 78 mg/dL (ref 0–99)
NonHDL: 95.32
Total CHOL/HDL Ratio: 3
Triglycerides: 87 mg/dL (ref 0.0–149.0)
VLDL: 17.4 mg/dL (ref 0.0–40.0)

## 2022-10-04 LAB — TSH: TSH: 0.9 u[IU]/mL (ref 0.35–5.50)

## 2022-10-04 LAB — HEPATIC FUNCTION PANEL
ALT: 9 U/L (ref 0–35)
AST: 10 U/L (ref 0–37)
Albumin: 4.3 g/dL (ref 3.5–5.2)
Alkaline Phosphatase: 48 U/L (ref 39–117)
Bilirubin, Direct: 0.2 mg/dL (ref 0.0–0.3)
Total Bilirubin: 1.2 mg/dL (ref 0.2–1.2)
Total Protein: 7 g/dL (ref 6.0–8.3)

## 2022-10-04 LAB — BASIC METABOLIC PANEL
BUN: 13 mg/dL (ref 6–23)
CO2: 28 mEq/L (ref 19–32)
Calcium: 9.2 mg/dL (ref 8.4–10.5)
Chloride: 100 mEq/L (ref 96–112)
Creatinine, Ser: 0.98 mg/dL (ref 0.40–1.20)
GFR: 54.94 mL/min — ABNORMAL LOW (ref >60.00)
Glucose, Bld: 89 mg/dL (ref 70–99)
Potassium: 3.5 mEq/L (ref 3.5–5.1)
Sodium: 137 mEq/L (ref 135–145)

## 2022-10-04 LAB — HEMOGLOBIN A1C: Hgb A1c MFr Bld: 6.4 % (ref 4.6–6.5)

## 2022-10-07 ENCOUNTER — Ambulatory Visit (INDEPENDENT_AMBULATORY_CARE_PROVIDER_SITE_OTHER): Payer: Medicare HMO | Admitting: Internal Medicine

## 2022-10-07 ENCOUNTER — Encounter: Payer: Self-pay | Admitting: Internal Medicine

## 2022-10-07 VITALS — BP 108/60 | HR 59 | Temp 98.3°F | Resp 14 | Ht 63.0 in | Wt 130.8 lb

## 2022-10-07 DIAGNOSIS — I10 Essential (primary) hypertension: Secondary | ICD-10-CM | POA: Diagnosis not present

## 2022-10-07 DIAGNOSIS — E1165 Type 2 diabetes mellitus with hyperglycemia: Secondary | ICD-10-CM | POA: Diagnosis not present

## 2022-10-07 DIAGNOSIS — Z83719 Family history of colon polyps, unspecified: Secondary | ICD-10-CM | POA: Diagnosis not present

## 2022-10-07 DIAGNOSIS — I700 Atherosclerosis of aorta: Secondary | ICD-10-CM

## 2022-10-07 DIAGNOSIS — E7800 Pure hypercholesterolemia, unspecified: Secondary | ICD-10-CM

## 2022-10-07 DIAGNOSIS — N1831 Chronic kidney disease, stage 3a: Secondary | ICD-10-CM

## 2022-10-07 NOTE — Progress Notes (Unsigned)
Patient ID: Christina Cole, female   DOB: Jan 08, 1941, 81 y.o.   MRN: [id number]   Subjective:    Patient ID: Christina Cole, female    DOB: 1941-08-31, 81 y.o.   MRN: [id number]   Patient here for  Chief Complaint  Patient presents with   Hypertension   .   HPI Here to follow up regarding blood pressure and blood sugar. On losartan and now on 1/2 triam/hctz.  Blood pressure doing well.  Stays active.  No chest pain or sob reported.  No abdominal pain or bowel change reported.  Has been having problems with her teeth.  Has had to adjust her diet related to this, but overall feels good.  No dizziness or light headedness.  Handling stress.  She contacted GI.  States colonoscopy - not needed.  Has lost weight.  She is eating and gets hungry. No nausea or vomiting.    Past Medical History:  Diagnosis Date   Heart murmur    Hyperlipidemia    Hypertension    Thyroid disease    goiter (follows with Dr. Ronnald Collum)   Past Surgical History:  Procedure Laterality Date   ABDOMINAL HYSTERECTOMY  1978   ovaries left in place   [place] Right 09/29/2017   Pt had Bx  done in Byrnett's office [place] @ 3:00 per chart-"FIBROCYSTIC CHANGES".   COLONOSCOPY WITH PROPOFOL N/A 07/11/2017   Procedure: COLONOSCOPY WITH PROPOFOL;  Surgeon: Lucilla Lame, MD;  Location: [facility] ENDOSCOPY;  Service: Endoscopy;  Laterality: N/A;   TUBAL LIGATION     Family History  Problem Relation Age of Onset   Stroke Mother    Hypertension Mother    Arthritis Mother    Breast cancer Sister 63   Breast cancer Sister 25   Lung cancer Brother    Diabetes Brother    Social History   Socioeconomic History   Marital status: Single    Spouse name: Not on file   Number of children: 3   Years of education: Not on file   Highest education level: Not on file  Occupational History   Not on file  Tobacco Use   Smoking status: Former   Smokeless tobacco: Never  Vaping Use   Vaping Use: Never used   Substance and Sexual Activity   Alcohol use: No    Alcohol/week: 0.0 standard drinks of alcohol   Drug use: No   Sexual activity: Not on file  Other Topics Concern   Not on file  Social History Narrative   Twin sister died 2021-11-22 and her brother died November 22, 2021    Social Determinants of Health   Financial Resource Strain: Not on file  Food Insecurity: Not on file  Transportation Needs: Not on file  Physical Activity: Not on file  Stress: Not on file  Social Connections: Not on file     Review of Systems  Constitutional:  Negative for appetite change and fever.  HENT:  Negative for congestion and sinus pressure.   Respiratory:  Negative for cough, chest tightness and shortness of breath.   Cardiovascular:  Negative for chest pain, palpitations and leg swelling.  Gastrointestinal:  Negative for abdominal pain, diarrhea, nausea and vomiting.  Genitourinary:  Negative for difficulty urinating and dysuria.  Musculoskeletal:  Negative for joint swelling and myalgias.  Skin:  Negative for color change and rash.  Neurological:  Negative for dizziness, light-headedness and headaches.  Psychiatric/Behavioral:  Negative for agitation and dysphoric mood.  Objective:     BP 108/60 (BP Location: Left Arm, Patient Position: Sitting, Cuff Size: Small)   Pulse (!) 59   Temp 98.3 F (36.8 C) (Temporal)   Resp 14   Ht _0  (1.6 m)   Wt 130 lb 12.8 oz (59.3 kg)   LMP 12/21/1976   SpO2 98%   BMI 23.17 kg/m  Wt Readings from Last 3 Encounters:  10/07/22 130 lb 12.8 oz (59.3 kg)  08/11/22 134 lb 12.8 oz (61.1 kg)  06/29/22 134 lb 12.8 oz (61.1 kg)    Physical Exam Vitals reviewed.  Constitutional:      General: She is not in acute distress.    Appearance: Normal appearance.  HENT:     Head: Normocephalic and atraumatic.     Right Ear: External ear normal.     Left Ear: External ear normal.  Eyes:     General: No scleral icterus.       Right eye: No discharge.         Left eye: No discharge.     Conjunctiva/sclera: Conjunctivae normal.  Neck:     Thyroid: No thyromegaly.  Cardiovascular:     Rate and Rhythm: Normal rate and regular rhythm.  Pulmonary:     Effort: No respiratory distress.     Breath sounds: Normal breath sounds. No wheezing.  Abdominal:     General: Bowel sounds are normal.     Palpations: Abdomen is soft.     Tenderness: There is no abdominal tenderness.  Musculoskeletal:        General: No swelling or tenderness.     Cervical back: Neck supple. No tenderness.  Lymphadenopathy:     Cervical: No cervical adenopathy.  Skin:    Findings: No erythema or rash.  Neurological:     Mental Status: She is alert.  Psychiatric:        Mood and Affect: Mood normal.        Behavior: Behavior normal.      Outpatient Encounter Medications as of 10/07/2022  Medication Sig   atorvastatin (LIPITOR) 40 MG tablet TAKE 1 TABLET EVERY DAY   EPINEPHRINE 0.3 mg/0.3 mL IJ SOAJ injection USE AS DIRECTED   hydrOXYzine (ATARAX) 25 MG tablet TAKE 1/2 TO 1 TABLET (12.5-25 MG TOTAL) BY MOUTH EVERY 8 (EIGHT) HOURS AS NEEDED.   levothyroxine (SYNTHROID, LEVOTHROID) 25 MCG tablet Take 25 mcg by mouth daily before breakfast.   losartan (COZAAR) 100 MG tablet TAKE 1 TABLET EVERY DAY   TIADYLT ER 360 MG 24 hr capsule TAKE 1 CAPSULE EVERY DAY   triamterene-hydrochlorothiazide (MAXZIDE-25) 37.5-25 MG tablet Take 1/2 tablet by mouth daily.   No facility-administered encounter medications on file as of 10/07/2022.     Lab Results  Component Value Date   WBC 4.1 06/27/2022   HGB 12.3 06/27/2022   HCT 37.0 06/27/2022   PLT 168.0 06/27/2022   GLUCOSE 89 10/04/2022   CHOL 152 10/04/2022   TRIG 87.0 10/04/2022   HDL 56.60 10/04/2022   LDLCALC 78 10/04/2022   ALT 9 10/04/2022   AST 10 10/04/2022   NA 137 10/04/2022   K 3.5 10/04/2022   CL 100 10/04/2022   CREATININE 0.98 10/04/2022   BUN 13 10/04/2022   CO2 28 10/04/2022   TSH 0.90 10/04/2022   HGBA1C  6.4 10/04/2022   MICROALBUR <0.7 04/18/2022    MM 3D SCREEN BREAST BILATERAL  Result Date: 09/14/2022 CLINICAL DATA:  Screening. EXAM: DIGITAL SCREENING BILATERAL MAMMOGRAM WITH  TOMOSYNTHESIS AND CAD TECHNIQUE: Bilateral screening digital craniocaudal and mediolateral oblique mammograms were obtained. Bilateral screening digital breast tomosynthesis was performed. The images were evaluated with computer-aided detection. COMPARISON:  Previous exam(s). ACR Breast Density Category c: The breast tissue is heterogeneously dense, which may obscure small masses. FINDINGS: There are no findings suspicious for malignancy. IMPRESSION: No mammographic evidence of malignancy. A result letter of this screening mammogram will be mailed directly to the patient. RECOMMENDATION: Screening mammogram in one year. (Code:SM-B-01Y) BI-RADS CATEGORY  1: Negative. Electronically Signed   By: Franki Cabot M.D.   On: 09/14/2022 08:21       Assessment & Plan:   Problem List Items Addressed This Visit     Aortic atherosclerosis ([place])    Continue lipitor.       CKD (chronic kidney disease) stage 3, GFR 30-59 ml/min (HCC)    Stay hydrated. Avoid antiinflammatories.  Continue losartan.  Follow metabolic panel.       Relevant Orders   Basic metabolic panel   Essential hypertension, benign - Primary    Blood pressure as outlined. Continue losartan and triam/hctz 37.5/25 - 1/2 tablet q day.  On potassium q day. Blood pressure doing well. Follow pressures.  Follow metabolic panel.       Family history of polyps in the colon    Had colonoscopy 07/2017.  Recommended f/u in 5 years.  She discussed with GI.  Recommended no further f/u colonoscopy unless problems.       Hypercholesterolemia    Continue lipitor.  Low cholesterol diet and exercise.  Follow lipid panel and liver function tests.       Relevant Orders   Hepatic function panel   Lipid panel   Type 2 diabetes mellitus with hyperglycemia (HCC)    Low carb  diet and exercise given elevated blood sugars.  Follow met b and a1c.  Lab Results  Component Value Date   HGBA1C 6.4 10/04/2022       Relevant Orders   Hemoglobin A1c     Einar Pheasant, MD

## 2022-10-08 ENCOUNTER — Encounter: Payer: Self-pay | Admitting: Internal Medicine

## 2022-10-08 NOTE — Assessment & Plan Note (Signed)
Low carb diet and exercise given elevated blood sugars.  Follow met b and a1c.  Lab Results  Component Value Date   HGBA1C 6.4 10/04/2022

## 2022-10-08 NOTE — Assessment & Plan Note (Signed)
Continue lipitor  ?

## 2022-10-08 NOTE — Assessment & Plan Note (Signed)
Stay hydrated. Avoid antiinflammatories.  Continue losartan.  Follow metabolic panel.

## 2022-10-08 NOTE — Assessment & Plan Note (Signed)
Blood pressure as outlined. Continue losartan and triam/hctz 37.5/25 - 1/2 tablet q day.  On potassium q day. Blood pressure doing well. Follow pressures.  Follow metabolic panel.

## 2022-10-08 NOTE — Assessment & Plan Note (Signed)
Had colonoscopy 07/2017.  Recommended f/u in 5 years.  She discussed with GI.  Recommended no further f/u colonoscopy unless problems.

## 2022-10-08 NOTE — Assessment & Plan Note (Signed)
Continue lipitor.  Low cholesterol diet and exercise.  Follow lipid panel and liver function tests.   

## 2022-10-27 ENCOUNTER — Other Ambulatory Visit: Payer: Medicare HMO

## 2022-11-01 ENCOUNTER — Ambulatory Visit: Payer: Medicare HMO | Admitting: Internal Medicine

## 2022-11-02 ENCOUNTER — Ambulatory Visit: Payer: Medicare HMO | Admitting: Internal Medicine

## 2022-12-22 ENCOUNTER — Telehealth: Payer: Self-pay | Admitting: Internal Medicine

## 2022-12-22 NOTE — Telephone Encounter (Signed)
Contacted Xenia Gilgenbach to schedule their annual wellness visit. Appointment made for 12/30/2022.  Thank you,  [place] Direct dial  [phone]

## 2022-12-23 ENCOUNTER — Other Ambulatory Visit: Payer: Self-pay | Admitting: Internal Medicine

## 2022-12-27 DIAGNOSIS — H524 Presbyopia: Secondary | ICD-10-CM | POA: Diagnosis not present

## 2022-12-27 DIAGNOSIS — Z961 Presence of intraocular lens: Secondary | ICD-10-CM | POA: Diagnosis not present

## 2022-12-30 ENCOUNTER — Ambulatory Visit (INDEPENDENT_AMBULATORY_CARE_PROVIDER_SITE_OTHER): Payer: Medicare HMO

## 2022-12-30 VITALS — Ht 63.0 in | Wt 130.0 lb

## 2022-12-30 DIAGNOSIS — Z0000 Encounter for general adult medical examination without abnormal findings: Secondary | ICD-10-CM

## 2022-12-30 NOTE — Progress Notes (Signed)
Subjective:   Christina Cole is a 82 y.o. female who presents for Medicare Annual (Subsequent) preventive examination.  Review of Systems    No ROS.  Medicare Wellness Virtual Visit.  Visual/audio telehealth visit, UTA vital signs.   See social history for additional risk factors.         Objective:    Today's Vitals   12/30/22 1332  Weight: 130 lb (59 kg)  Height: '5\' 3"'$  (1.6 m)   Body mass index is 23.03 kg/m.     12/30/2022    1:32 PM 10/21/2018    4:50 PM 07/20/2017   11:05 AM 02/05/2017    8:20 PM 02/25/2015    1:45 PM  Advanced Directives  Does Patient Have a Medical Advance Directive? Yes No No No Yes  Type of Christina Cole;Living will    Christina Cole  Does patient want to make changes to medical advance directive? No - Patient declined    No - Patient declined  Copy of Christina Cole in Chart? No - copy requested      Would patient like information on creating a medical advance directive?  No - Patient declined No - Patient declined No - Patient declined     Current Medications (verified) Outpatient Encounter Medications as of 12/30/2022  Medication Sig   atorvastatin (LIPITOR) 40 MG tablet TAKE 1 TABLET EVERY DAY   EPINEPHRINE 0.3 mg/0.3 mL IJ SOAJ injection USE AS DIRECTED   hydrOXYzine (ATARAX) 25 MG tablet TAKE 1/2 TO 1 TABLET (12.5-25 MG TOTAL) BY MOUTH EVERY 8 (EIGHT) HOURS AS NEEDED.   levothyroxine (SYNTHROID, LEVOTHROID) 25 MCG tablet Take 25 mcg by mouth daily before breakfast.   losartan (COZAAR) 100 MG tablet TAKE 1 TABLET EVERY DAY   TIADYLT ER 360 MG 24 hr capsule TAKE 1 CAPSULE EVERY DAY   triamterene-hydrochlorothiazide (MAXZIDE-25) 37.5-25 MG tablet Take 1/2 tablet by mouth daily.   No facility-administered encounter medications on file as of 12/30/2022.    Allergies (verified) Sulfa antibiotics   History: Past Medical History:  Diagnosis Date   Heart murmur    Hyperlipidemia     Hypertension    Thyroid disease    goiter (follows with Dr. Ronnald Cole)   Past Surgical History:  Procedure Laterality Date   ABDOMINAL HYSTERECTOMY  1978   ovaries left in place   [place] Right 09/29/2017   Pt had Bx  done in [facility]'s office [place] @ 3:00 per chart-"FIBROCYSTIC CHANGES".   COLONOSCOPY WITH PROPOFOL N/A 07/11/2017   Procedure: COLONOSCOPY WITH PROPOFOL;  Surgeon: Lucilla Lame, MD;  Location: [facility] ENDOSCOPY;  Service: Endoscopy;  Laterality: N/A;   TUBAL LIGATION     Family History  Problem Relation Age of Onset   Stroke Mother    Hypertension Mother    Arthritis Mother    Breast cancer Sister 25   Breast cancer Sister 50   Lung cancer Brother    Diabetes Brother    Social History   Socioeconomic History   Marital status: Single    Spouse name: Not on file   Number of children: 3   Years of education: Not on file   Highest education level: Not on file  Occupational History   Not on file  Tobacco Use   Smoking status: Former   Smokeless tobacco: Never  Vaping Use   Vaping Use: Never used  Substance and Sexual Activity   Alcohol use:  No    Alcohol/week: 0.0 standard drinks of alcohol   Drug use: No   Sexual activity: Not on file  Other Topics Concern   Not on file  Social History Narrative   Twin sister died 2021-11-13 and her brother died 2021-11-13    Social Determinants of Health   Financial Resource Strain: Low Risk  (12/30/2022)   Overall Financial Resource Strain (CARDIA)    Difficulty of Paying Living Expenses: Not hard at all  Food Insecurity: No Food Insecurity (12/30/2022)   Hunger Vital Sign    Worried About Running Out of Food in the Last Year: Never true    Ran Out of Food in the Last Year: Never true  Transportation Needs: No Transportation Needs (12/30/2022)   PRAPARE - Hydrologist (Medical): No    Lack of Transportation (Non-Medical): No  Physical Activity: Not on file  Stress: No  Stress Concern Present (12/30/2022)   [place]    Feeling of Stress : Not at all  Social Connections: Unknown (12/30/2022)   Social Connection and Isolation Panel [NHANES]    Frequency of Communication with Friends and Family: More than three times a week    Frequency of Social Gatherings with Friends and Family: More than three times a week    Attends Religious Services: More than 4 times per year    Active Member of [employer] or Organizations: Not on file    Attends Music therapist: More than 4 times per year    Marital Status: Not on file    Tobacco Counseling Counseling given: Not Answered   Clinical Intake:  Pre-visit preparation completed: Yes        Diabetes: No  How often do you need to have someone help you when you read instructions, pamphlets, or other written materials from your doctor or pharmacy?: 1 - Never    Interpreter Needed?: No      Activities of Daily Living    12/30/2022    1:34 PM  In your present state of health, do you have any difficulty performing the following activities:  Hearing? 0  Vision? 0  Difficulty concentrating or making decisions? 0  Walking or climbing stairs? 0  Dressing or bathing? 0  Doing errands, shopping? 0  Preparing Food and eating ? N  Using the Toilet? N  In the past six months, have you accidently leaked urine? N  Do you have problems with loss of bowel control? N  Managing your Medications? N    Patient Care Team: Einar Pheasant, MD as PCP - General (Internal Medicine) Kate Sable, MD as PCP - Cardiology (Cardiology) Lenard Simmer, MD as Attending Physician (Endocrinology)  Indicate any recent Medical Services you may have received from other than [facility] providers in the past year (date may be approximate).     Assessment:   This is a routine wellness examination for Christina Cole.  I connected with  Christina Cole on 12/30/22  by a audio enabled telemedicine application and verified that I am speaking with the correct person using two identifiers.  Patient Location: Home  Provider Location: Office/Clinic  I discussed the limitations of evaluation and management by telemedicine. The patient expressed understanding and agreed to proceed.   Hearing/Vision screen Hearing Screening - Comments:: Patient is able to hear conversational tones without difficulty.  No issues reported.   Vision Screening - Comments:: Followed by Dr. Gloriann Loan Wears corrective lenses  Visual acuity not assessed per patient preference since they have regular follow up with the ophthalmologist    Dietary issues and exercise activities discussed:   Healthy diet Good water intake   Goals Addressed             This Visit's Progress    Increase physical activity       Walking for 30 minutes at least 3 days a week       Depression Screen    12/30/2022    1:34 PM 10/07/2022    7:09 AM 08/11/2022    9:45 AM 06/29/2022   11:27 AM 04/28/2022   10:28 AM 04/04/2022   11:02 AM 10/27/2021   11:15 AM  PHQ 2/9 Scores  PHQ - 2 Score 0 0 0 0 0 0 0    Fall Risk    12/30/2022    1:34 PM 10/07/2022    7:09 AM 08/11/2022    9:45 AM 06/29/2022   11:27 AM 04/28/2022   10:27 AM  [place] in the past year? 0 0 0 0 0  Number falls in past yr: 0 0  0 0  Injury with Fall? 0 0  0 0  Risk for fall due to :  No Fall Risks No Fall Risks No Fall Risks No Fall Risks  Follow up Falls evaluation completed;Falls prevention discussed Falls evaluation completed Falls evaluation completed Falls evaluation completed Falls evaluation completed    FALL RISK PREVENTION PERTAINING TO THE HOME: Home free of loose throw rugs in walkways, pet beds, electrical cords, etc? Yes  Adequate lighting in your home to reduce risk of falls? Yes   ASSISTIVE DEVICES UTILIZED TO PREVENT FALLS: Life alert? No  Use of a cane, walker or w/c? No  Grab bars in the bathroom?  Yes  Shower chair or bench in shower? No  Elevated toilet seat or a handicapped toilet? No   TIMED UP AND GO: Was the test performed? No .   Cognitive Function:    07/20/2017   11:18 AM 02/25/2015    1:51 PM  MMSE - Mini Mental State Exam  Orientation to time 5 5  Orientation to Place 5 5  Registration 3 3  Attention/ Calculation 5 5  Recall 3 3  Language- name 2 objects 2 2  Language- repeat 1 1  Language- follow 3 step command 3 3  Language- read & follow direction 1 1  Write a sentence 1 1  Copy design 1 1  Total score 30 30        12/30/2022    1:43 PM  6CIT Screen  What Year? 0 points  What month? 0 points  What time? 0 points  Count back from 20 0 points  Months in reverse 0 points  Repeat phrase 0 points  Total Score 0 points    Immunizations Immunization History  Administered Date(s) Administered   DTaP 12/21/2010   Fluad Quad(high Dose 65+) 07/15/2019, 07/29/2022   Influenza Split 07/21/2013   Influenza, High Dose Seasonal PF 07/18/2017, 07/06/2018, 08/09/2021   Influenza-Unspecified 07/02/2014, 08/05/2015, 06/10/2016, 08/12/2020   PFIZER(Purple Top)SARS-COV-2 Vaccination 01/15/2020, 02/04/2020, 10/06/2020   Pneumococcal Conjugate-13 12/22/2011   Pneumococcal Polysaccharide-23 02/25/2015   Tdap 10/21/2018   Screening Tests Health Maintenance  Topic Date Due   Zoster Vaccines- Shingrix (1 of 2) 01/06/2023 (Originally 05/12/1960)   COVID-19 Vaccine (4 - 2023-24 season) 01/15/2023 (Originally 07/01/2022)   COLONOSCOPY (Pts 45-47yr Insurance coverage will need to be confirmed)  10/08/2023 (Originally 07/11/2022)   FOOT EXAM  01/27/2023   HEMOGLOBIN A1C  04/05/2023   Diabetic kidney evaluation - Urine ACR  04/19/2023   MAMMOGRAM  09/13/2023   Diabetic kidney evaluation - eGFR measurement  10/05/2023   OPHTHALMOLOGY EXAM  12/28/2023   Medicare Annual Wellness (AWV)  12/30/2023   DTaP/Tdap/Td (3 - Td or Tdap) 10/21/2028   Pneumonia Vaccine 94+ Years old   Completed   INFLUENZA VACCINE  Completed   DEXA SCAN  Completed   Hepatitis C Screening  Completed   HPV VACCINES  Aged Out    Health Maintenance There are no preventive care reminders to display for this patient.  Lung Cancer Screening: (Low Dose CT Chest recommended if Age 63-80 years, 30 pack-year currently smoking OR have quit w/in 15years.) does not qualify.   Hepatitis C Screening: does not qualify.  Vision Screening: Recommended annual ophthalmology exams for early detection of glaucoma and other disorders of the eye.  Dental Screening: Recommended annual dental exams for proper oral hygiene  Community Resource Referral / Chronic Care Management: CRR required this visit?  No   CCM required this visit?  No      Plan:     I have personally reviewed and noted the following in the patient's chart:   Medical and social history Use of alcohol, tobacco or illicit drugs  Current medications and supplements including opioid prescriptions. Patient is not currently taking opioid prescriptions. Functional ability and status Nutritional status Physical activity Advanced directives List of other physicians Hospitalizations, surgeries, and ER visits in previous 12 months Vitals Screenings to include cognitive, depression, and falls Referrals and appointments  In addition, I have reviewed and discussed with patient certain preventive protocols, quality metrics, and best practice recommendations. A written personalized care plan for preventive services as well as general preventive health recommendations were provided to patient.     Leta Jungling, LPN   [id number]

## 2022-12-30 NOTE — Patient Instructions (Addendum)
[name] , Thank you for taking time to come for your Medicare Wellness Visit. I appreciate your ongoing commitment to your health goals. Please review the following plan we discussed and let me know if I can assist you in the future.   These are the goals we discussed:  Goals      Increase physical activity     Walking for 30 minutes at least 3 days a week        This is a list of the screening recommended for you and due dates:  Health Maintenance  Topic Date Due   Zoster (Shingles) Vaccine (1 of 2) 01/06/2023*   COVID-19 Vaccine (4 - 2023-24 season) 01/15/2023*   Colon Cancer Screening  10/08/2023*   Complete foot exam   01/27/2023   Hemoglobin A1C  04/05/2023   Yearly kidney health urinalysis for diabetes  04/19/2023   Mammogram  09/13/2023   Yearly kidney function blood test for diabetes  10/05/2023   Eye exam for diabetics  12/28/2023   Medicare Annual Wellness Visit  12/30/2023   DTaP/Tdap/Td vaccine (3 - Td or Tdap) 10/21/2028   Pneumonia Vaccine  Completed   Flu Shot  Completed   DEXA scan (bone density measurement)  Completed   Hepatitis C Screening: USPSTF Recommendation to screen - Ages 58-79 yo.  Completed   HPV Vaccine  Aged Out  *Topic was postponed. The date shown is not the original due date.   Advanced directives: End of life planning; Advance aging; Advanced directives discussed.  Copy of current HCPOA/Living Will requested.    Conditions/risks identified: none new.  Next appointment: Follow up in one year for your annual wellness visit    Preventive Care 65 Years and Older, Female Preventive care refers to lifestyle choices and visits with your health care provider that can promote health and wellness. What does preventive care include? A yearly physical exam. This is also called an annual well check. Dental exams once or twice a year. Routine eye exams. Ask your health care provider how often you should have your eyes checked. Personal lifestyle  choices, including: Daily care of your teeth and gums. Regular physical activity. Eating a healthy diet. Avoiding tobacco and drug use. Limiting alcohol use. Practicing safe sex. Taking low-dose aspirin every day. Taking vitamin and mineral supplements as recommended by your health care provider. What happens during an annual well check? The services and screenings done by your health care provider during your annual well check will depend on your age, overall health, lifestyle risk factors, and family history of disease. Counseling  Your health care provider may ask you questions about your: Alcohol use. Tobacco use. Drug use. Emotional well-being. Home and relationship well-being. Sexual activity. Eating habits. History of falls. Memory and ability to understand (cognition). Work and work Statistician. Reproductive health. Screening  You may have the following tests or measurements: Height, weight, and BMI. Blood pressure. Lipid and cholesterol levels. These may be checked every 5 years, or more frequently if you are over 46 years old. Skin check. Lung cancer screening. You may have this screening every year starting at age 41 if you have a 30-pack-year history of smoking and currently smoke or have quit within the past 15 years. Fecal occult blood test (FOBT) of the stool. You may have this test every year starting at age 34. Flexible sigmoidoscopy or colonoscopy. You may have a sigmoidoscopy every 5 years or a colonoscopy every 10 years starting at age 7. Hepatitis C  blood test. Hepatitis B blood test. Sexually transmitted disease (STD) testing. Diabetes screening. This is done by checking your blood sugar (glucose) after you have not eaten for a while (fasting). You may have this done every 1-3 years. Bone density scan. This is done to screen for osteoporosis. You may have this done starting at age 69. Mammogram. This may be done every 1-2 years. Talk to your health care  provider about how often you should have regular mammograms. Talk with your health care provider about your test results, treatment options, and if necessary, the need for more tests. Vaccines  Your health care provider may recommend certain vaccines, such as: Influenza vaccine. This is recommended every year. Tetanus, diphtheria, and acellular pertussis (Tdap, Td) vaccine. You may need a Td booster every 10 years. Zoster vaccine. You may need this after age 15. Pneumococcal 13-valent conjugate (PCV13) vaccine. One dose is recommended after age 30. Pneumococcal polysaccharide (PPSV23) vaccine. One dose is recommended after age 75. Talk to your health care provider about which screenings and vaccines you need and how often you need them. This information is not intended to replace advice given to you by your health care provider. Make sure you discuss any questions you have with your health care provider. Document Released: 11/13/2015 Document Revised: 07/06/2016 Document Reviewed: 08/18/2015 Elsevier Interactive Patient Education  2017 [place] Prevention in the Home Falls can cause injuries. They can happen to people of all ages. There are many things you can do to make your home safe and to help prevent falls. What can I do on the outside of my home? Regularly fix the edges of walkways and driveways and fix any cracks. Remove anything that might make you trip as you walk through a door, such as a raised step or threshold. Trim any bushes or trees on the path to your home. Use bright outdoor lighting. Clear any walking paths of anything that might make someone trip, such as rocks or tools. Regularly check to see if handrails are loose or broken. Make sure that both sides of any steps have handrails. Any raised decks and porches should have guardrails on the edges. Have any leaves, snow, or ice cleared regularly. Use sand or salt on walking paths during winter. Clean up any  spills in your garage right away. This includes oil or grease spills. What can I do in the bathroom? Use night lights. Install grab bars by the toilet and in the tub and shower. Do not use towel bars as grab bars. Use non-skid mats or decals in the tub or shower. If you need to sit down in the shower, use a plastic, non-slip stool. Keep the floor dry. Clean up any water that spills on the floor as soon as it happens. Remove soap buildup in the tub or shower regularly. Attach bath mats securely with double-sided non-slip rug tape. Do not have throw rugs and other things on the floor that can make you trip. What can I do in the bedroom? Use night lights. Make sure that you have a light by your bed that is easy to reach. Do not use any sheets or blankets that are too big for your bed. They should not hang down onto the floor. Have a firm chair that has side arms. You can use this for support while you get dressed. Do not have throw rugs and other things on the floor that can make you trip. What can I do in the kitchen?  Clean up any spills right away. Avoid walking on wet floors. Keep items that you use a lot in easy-to-reach places. If you need to reach something above you, use a strong step stool that has a grab bar. Keep electrical cords out of the way. Do not use floor polish or wax that makes floors slippery. If you must use wax, use non-skid floor wax. Do not have throw rugs and other things on the floor that can make you trip. What can I do with my stairs? Do not leave any items on the stairs. Make sure that there are handrails on both sides of the stairs and use them. Fix handrails that are broken or loose. Make sure that handrails are as long as the stairways. Check any carpeting to make sure that it is firmly attached to the stairs. Fix any carpet that is loose or worn. Avoid having throw rugs at the top or bottom of the stairs. If you do have throw rugs, attach them to the floor  with carpet tape. Make sure that you have a light switch at the top of the stairs and the bottom of the stairs. If you do not have them, ask someone to add them for you. What else can I do to help prevent falls? Wear shoes that: Do not have high heels. Have rubber bottoms. Are comfortable and fit you well. Are closed at the toe. Do not wear sandals. If you use a stepladder: Make sure that it is fully opened. Do not climb a closed stepladder. Make sure that both sides of the stepladder are locked into place. Ask someone to hold it for you, if possible. Clearly mark and make sure that you can see: Any grab bars or handrails. First and last steps. Where the edge of each step is. Use tools that help you move around (mobility aids) if they are needed. These include: Canes. Walkers. Scooters. Crutches. Turn on the lights when you go into a dark area. Replace any light bulbs as soon as they burn out. Set up your furniture so you have a clear path. Avoid moving your furniture around. If any of your floors are uneven, fix them. If there are any pets around you, be aware of where they are. Review your medicines with your doctor. Some medicines can make you feel dizzy. This can increase your chance of falling. Ask your doctor what other things that you can do to help prevent falls. This information is not intended to replace advice given to you by your health care provider. Make sure you discuss any questions you have with your health care provider. Document Released: 08/13/2009 Document Revised: 03/24/2016 Document Reviewed: 11/21/2014 Elsevier Interactive Patient Education  2017 [employer].

## 2023-02-07 ENCOUNTER — Other Ambulatory Visit (INDEPENDENT_AMBULATORY_CARE_PROVIDER_SITE_OTHER): Payer: Medicare HMO

## 2023-02-07 DIAGNOSIS — N1831 Chronic kidney disease, stage 3a: Secondary | ICD-10-CM | POA: Diagnosis not present

## 2023-02-07 DIAGNOSIS — E7800 Pure hypercholesterolemia, unspecified: Secondary | ICD-10-CM | POA: Diagnosis not present

## 2023-02-07 DIAGNOSIS — E1165 Type 2 diabetes mellitus with hyperglycemia: Secondary | ICD-10-CM

## 2023-02-07 LAB — BASIC METABOLIC PANEL
BUN: 16 mg/dL (ref 6–23)
CO2: 27 mEq/L (ref 19–32)
Calcium: 9.2 mg/dL (ref 8.4–10.5)
Chloride: 102 mEq/L (ref 96–112)
Creatinine, Ser: 1.08 mg/dL (ref 0.40–1.20)
GFR: 48.78 mL/min — ABNORMAL LOW (ref >60.00)
Glucose, Bld: 93 mg/dL (ref 70–99)
Potassium: 3.5 mEq/L (ref 3.5–5.1)
Sodium: 138 mEq/L (ref 135–145)

## 2023-02-07 LAB — LIPID PANEL
Cholesterol: 151 mg/dL (ref 0–200)
HDL: 55.3 mg/dL (ref >39.00)
LDL Cholesterol: 77 mg/dL (ref 0–99)
NonHDL: 95.5
Total CHOL/HDL Ratio: 3
Triglycerides: 91 mg/dL (ref 0.0–149.0)
VLDL: 18.2 mg/dL (ref 0.0–40.0)

## 2023-02-07 LAB — HEPATIC FUNCTION PANEL
ALT: 14 U/L (ref 0–35)
AST: 13 U/L (ref 0–37)
Albumin: 4.1 g/dL (ref 3.5–5.2)
Alkaline Phosphatase: 52 U/L (ref 39–117)
Bilirubin, Direct: 0.2 mg/dL (ref 0.0–0.3)
Total Bilirubin: 0.7 mg/dL (ref 0.2–1.2)
Total Protein: 7.1 g/dL (ref 6.0–8.3)

## 2023-02-07 LAB — HEMOGLOBIN A1C: Hgb A1c MFr Bld: 6.4 % (ref 4.6–6.5)

## 2023-02-09 ENCOUNTER — Ambulatory Visit (INDEPENDENT_AMBULATORY_CARE_PROVIDER_SITE_OTHER): Payer: Medicare HMO | Admitting: Internal Medicine

## 2023-02-09 ENCOUNTER — Encounter: Payer: Self-pay | Admitting: Internal Medicine

## 2023-02-09 VITALS — BP 120/70 | HR 89 | Temp 98.2°F | Resp 16 | Ht 63.0 in | Wt 133.8 lb

## 2023-02-09 DIAGNOSIS — D649 Anemia, unspecified: Secondary | ICD-10-CM

## 2023-02-09 DIAGNOSIS — E7800 Pure hypercholesterolemia, unspecified: Secondary | ICD-10-CM | POA: Diagnosis not present

## 2023-02-09 DIAGNOSIS — E1165 Type 2 diabetes mellitus with hyperglycemia: Secondary | ICD-10-CM

## 2023-02-09 DIAGNOSIS — I10 Essential (primary) hypertension: Secondary | ICD-10-CM | POA: Diagnosis not present

## 2023-02-09 DIAGNOSIS — R011 Cardiac murmur, unspecified: Secondary | ICD-10-CM | POA: Diagnosis not present

## 2023-02-09 DIAGNOSIS — Z83719 Family history of colon polyps, unspecified: Secondary | ICD-10-CM

## 2023-02-09 DIAGNOSIS — E079 Disorder of thyroid, unspecified: Secondary | ICD-10-CM

## 2023-02-09 DIAGNOSIS — N1831 Chronic kidney disease, stage 3a: Secondary | ICD-10-CM | POA: Diagnosis not present

## 2023-02-09 DIAGNOSIS — I700 Atherosclerosis of aorta: Secondary | ICD-10-CM

## 2023-02-09 LAB — HM DIABETES FOOT EXAM

## 2023-02-09 MED ORDER — TRIAMTERENE-HCTZ 37.5-25 MG PO TABS: ORAL_TABLET | ORAL | 1 refills | Status: DC

## 2023-02-09 NOTE — Assessment & Plan Note (Signed)
Follow cbc.  

## 2023-02-09 NOTE — Assessment & Plan Note (Signed)
Continue lipitor.  Low cholesterol diet and exercise.  Follow lipid panel and liver function tests.   

## 2023-02-09 NOTE — Assessment & Plan Note (Signed)
Continue lipitor  ?

## 2023-02-09 NOTE — Progress Notes (Signed)
Subjective:    Patient ID: Christina Cole, female    DOB: [birth year], 82 y.o.   MRN: [id number]  Patient here for  Chief Complaint  Patient presents with   Medical Management of Chronic Issues    HPI Here to follow up regarding blood pressure and blood sugar. On losartan and now on 1/2 triam/hctz.  Discussed labs.  A1c 6.4.  discussed staying hydrated.  No chest pain or sob reported.  No cough reported.  Some allergy symptoms - mostly eyes.  Using eye drops.  Controls.  No nausea or vomiting.  No abdominal pain.  Bowels moving.   Past Medical History:  Diagnosis Date   Heart murmur    Hyperlipidemia    Hypertension    Thyroid disease    goiter (follows with Dr. Patrecia PaceMorayati)   Past Surgical History:  Procedure Laterality Date   ABDOMINAL HYSTERECTOMY  1978   ovaries left in place   APPENDECTOMY  1978   BREAST BIOPSY Right 09/29/2017   Pt had Bx  done in [facility] office [facility] @ 3:00 per chart-"FIBROCYSTIC CHANGES".   COLONOSCOPY WITH PROPOFOL N/A 07/11/2017   Procedure: COLONOSCOPY WITH PROPOFOL;  Surgeon: Midge MiniumWohl, Darren, MD;  [facility];  Service: Endoscopy;  Laterality: N/A;   TUBAL LIGATION     Family History  Problem Relation Age of Onset   Stroke Mother    Hypertension Mother    Arthritis Mother    Breast cancer Sister [id number]   Breast cancer Sister [id number]   Lung cancer Brother    Diabetes Brother    Social History   Socioeconomic History   Marital status: Single    Spouse name: Not on file   Number of children: 3   Years of education: Not on file   Highest education level: Not on file  Occupational History   Not on file  Tobacco Use   Smoking status: Former   Smokeless tobacco: Never  Vaping Use   Vaping Use: Never used  Substance and Sexual Activity   Alcohol use: No    Alcohol/week: 0.0 standard drinks of alcohol   Drug use: No   Sexual activity: Not on file  Other Topics Concern   Not on file  Social History Narrative   Twin sister died  10/2021 and her brother died 10/2021    Social Determinants of Health   Financial Resource Strain: Low Risk  (12/30/2022)   Overall Financial Resource Strain (CARDIA)    Difficulty of Paying Living Expenses: Not hard at all  Food Insecurity: No Food Insecurity (12/30/2022)   Hunger Vital Sign    Worried About Running Out of Food in the Last Year: Never true    Ran Out of Food in the Last Year: Never true  Transportation Needs: No Transportation Needs (12/30/2022)   PRAPARE - Administrator, Civil ServiceTransportation    Lack of Transportation (Medical): No    Lack of Transportation (Non-Medical): No  Physical Activity: Not on file  Stress: No Stress Concern Present (12/30/2022)   [employer] - Occupational Stress Questionnaire    Feeling of Stress : Not at all  Social Connections: Unknown (12/30/2022)   Social Connection and Isolation Panel [NHANES]    Frequency of Communication with Friends and Family: More than three times a week    Frequency of Social Gatherings with Friends and Family: More than three times a week    Attends Religious Services: More than 4 times per year    Active Member of  Clubs or Organizations: Not on file    Attends Club or Organization Meetings: More than 4 times per year    Marital Status: Not on file     Review of Systems  Constitutional:  Negative for appetite change and unexpected weight change.  HENT:  Negative for congestion and sinus pressure.   Respiratory:  Negative for cough, chest tightness and shortness of breath.   Cardiovascular:  Negative for chest pain and palpitations.  Gastrointestinal:  Negative for abdominal pain, diarrhea, nausea and vomiting.  Genitourinary:  Negative for difficulty urinating and dysuria.  Musculoskeletal:  Negative for joint swelling and myalgias.  Skin:  Negative for color change and rash.  Neurological:  Negative for dizziness and headaches.  Psychiatric/Behavioral:  Negative for agitation and dysphoric mood.         Objective:     BP 120/70   Pulse 89   Temp 98.2 F (36.8 C)   Resp 16   Ht 5\' 3"  (1.6 m)   Wt 133 lb 12.8 oz (60.7 kg)   LMP 12/21/1976   SpO2 98%   BMI 23.70 kg/m  Wt Readings from Last 3 Encounters:  02/09/23 133 lb 12.8 oz (60.7 kg)  12/30/22 130 lb (59 kg)  10/07/22 130 lb 12.8 oz (59.3 kg)    Physical Exam Vitals reviewed.  Constitutional:      General: She is not in acute distress.    Appearance: Normal appearance.  HENT:     Head: Normocephalic and atraumatic.     Right Ear: External ear normal.     Left Ear: External ear normal.  Eyes:     General: No scleral icterus.       Right eye: No discharge.        Left eye: No discharge.     Conjunctiva/sclera: Conjunctivae normal.  Neck:     Thyroid: No thyromegaly.  Cardiovascular:     Rate and Rhythm: Normal rate and regular rhythm.  Pulmonary:     Effort: No respiratory distress.     Breath sounds: Normal breath sounds. No wheezing.  Abdominal:     General: Bowel sounds are normal.     Palpations: Abdomen is soft.     Tenderness: There is no abdominal tenderness.  Musculoskeletal:        General: No swelling or tenderness.     Cervical back: Neck supple. No tenderness.  Lymphadenopathy:     Cervical: No cervical adenopathy.  Skin:    Findings: No erythema or rash.  Neurological:     Mental Status: She is alert.  Psychiatric:        Mood and Affect: Mood normal.        Behavior: Behavior normal.      Outpatient Encounter Medications as of 02/09/2023  Medication Sig   atorvastatin (LIPITOR) 40 MG tablet TAKE 1 TABLET EVERY DAY   EPINEPHRINE 0.3 mg/0.3 mL IJ SOAJ injection USE AS DIRECTED   levothyroxine (SYNTHROID, LEVOTHROID) 25 MCG tablet Take 25 mcg by mouth daily before breakfast.   losartan (COZAAR) 100 MG tablet TAKE 1 TABLET EVERY DAY   TIADYLT ER 360 MG 24 hr capsule TAKE 1 CAPSULE EVERY DAY   triamterene-hydrochlorothiazide (MAXZIDE-25) 37.5-25 MG tablet Take 1/2 tablet by mouth daily.    [DISCONTINUED] hydrOXYzine (ATARAX) 25 MG tablet TAKE 1/2 TO 1 TABLET (12.5-25 MG TOTAL) BY MOUTH EVERY 8 (EIGHT) HOURS AS NEEDED.   [DISCONTINUED] triamterene-hydrochlorothiazide (MAXZIDE-25) 37.5-25 MG tablet Take 1/2 tablet by mouth daily.  No facility-administered encounter medications on file as of 02/09/2023.     Lab Results  Component Value Date   WBC 4.1 06/27/2022   HGB 12.3 06/27/2022   HCT 37.0 06/27/2022   PLT 168.0 06/27/2022   GLUCOSE 93 02/07/2023   CHOL 151 02/07/2023   TRIG 91.0 02/07/2023   HDL 55.30 02/07/2023   LDLCALC 77 02/07/2023   ALT 14 02/07/2023   AST 13 02/07/2023   NA 138 02/07/2023   K 3.5 02/07/2023   CL 102 02/07/2023   CREATININE 1.08 02/07/2023   BUN 16 02/07/2023   CO2 27 02/07/2023   TSH 0.90 10/04/2022   HGBA1C 6.4 02/07/2023   MICROALBUR <0.7 04/18/2022    MM 3D SCREEN BREAST BILATERAL  Result Date: 09/14/2022 CLINICAL DATA:  Screening. EXAM: DIGITAL SCREENING BILATERAL MAMMOGRAM WITH TOMOSYNTHESIS AND CAD TECHNIQUE: Bilateral screening digital craniocaudal and mediolateral oblique mammograms were obtained. Bilateral screening digital breast tomosynthesis was performed. The images were evaluated with computer-aided detection. COMPARISON:  Previous exam(s). ACR Breast Density Category c: The breast tissue is heterogeneously dense, which may obscure small masses. FINDINGS: There are no findings suspicious for malignancy. IMPRESSION: No mammographic evidence of malignancy. A result letter of this screening mammogram will be mailed directly to the patient. RECOMMENDATION: Screening mammogram in one year. (Code:[id number]-B-01Y) BI-RADS CATEGORY  1: Negative. Electronically Signed   By: Bary Richard M.D.   On: 09/14/2022 08:21       Assessment & Plan:  Hypercholesterolemia Assessment & Plan: Continue lipitor.  Low cholesterol diet and exercise.  Follow lipid panel and liver function tests.   Orders: -     Lipid panel; Future -     Hepatic function  panel; Future  Type 2 diabetes mellitus with hyperglycemia, without long-term current use of insulin Assessment & Plan: Low carb diet and exercise given elevated blood sugars.  Follow met b and a1c.  Lab Results  Component Value Date   HGBA1C 6.4 02/07/2023    Orders: -     Hemoglobin A1c; Future  Essential hypertension, benign -     Basic metabolic panel; Future  Thyroid disease Assessment & Plan: On thyroid replacement.  Follow tsh.    Heart murmur Assessment & Plan: Saw cardiology.  ECHO as outlined.  Aortic sclerosis.  No symptoms.  No dizziness or light headedness.  No syncope or near syncope.     Family history of polyps in the colon Assessment & Plan: Had colonoscopy 07/2017.  Recommended f/u in 5 years.  She discussed with GI.  Recommended no further f/u colonoscopy unless problems.    Stage 3a chronic kidney disease Assessment & Plan: Stay hydrated. Avoid antiinflammatories.  Continue losartan.  Follow metabolic panel.    Aortic atherosclerosis Assessment & Plan: Continue lipitor.    Anemia, unspecified type Assessment & Plan: Follow cbc.    Other orders -     Triamterene-HCTZ; Take 1/2 tablet by mouth daily.  Dispense: 45 tablet; Refill: 1     Dale Byers, MD

## 2023-02-09 NOTE — Assessment & Plan Note (Signed)
Saw cardiology.  ECHO as outlined.  Aortic sclerosis.  No symptoms.  No dizziness or light headedness.  No syncope or near syncope.

## 2023-02-09 NOTE — Assessment & Plan Note (Signed)
On thyroid replacement.  Follow tsh.  

## 2023-02-09 NOTE — Assessment & Plan Note (Signed)
Stay hydrated. Avoid antiinflammatories.  Continue losartan.  Follow metabolic panel.  

## 2023-02-09 NOTE — Assessment & Plan Note (Signed)
Had colonoscopy 07/2017.  Recommended f/u in 5 years.  She discussed with GI.  Recommended no further f/u colonoscopy unless problems.  

## 2023-02-09 NOTE — Assessment & Plan Note (Signed)
Low carb diet and exercise given elevated blood sugars.  Follow met b and a1c.  Lab Results  Component Value Date   HGBA1C 6.4 02/07/2023

## 2023-03-17 DIAGNOSIS — E039 Hypothyroidism, unspecified: Secondary | ICD-10-CM | POA: Diagnosis not present

## 2023-03-17 DIAGNOSIS — E041 Nontoxic single thyroid nodule: Secondary | ICD-10-CM | POA: Diagnosis not present

## 2023-03-17 DIAGNOSIS — I10 Essential (primary) hypertension: Secondary | ICD-10-CM | POA: Diagnosis not present

## 2023-03-17 DIAGNOSIS — E785 Hyperlipidemia, unspecified: Secondary | ICD-10-CM | POA: Diagnosis not present

## 2023-03-17 DIAGNOSIS — E049 Nontoxic goiter, unspecified: Secondary | ICD-10-CM | POA: Diagnosis not present

## 2023-03-17 LAB — BASIC METABOLIC PANEL
BUN: 12 (ref 4–21)
Chloride: 101 (ref 99–108)
Creatinine: 1.1 (ref 0.5–1.1)
Glucose: 94
Potassium: 3.6 mEq/L (ref 3.5–5.1)
Sodium: 140 (ref 137–147)

## 2023-03-17 LAB — IRON,TIBC AND FERRITIN PANEL: Iron: 54

## 2023-03-17 LAB — CBC: RBC: 4.2 (ref 3.87–5.11)

## 2023-03-17 LAB — HEPATIC FUNCTION PANEL
ALT: 14 U/L (ref 7–35)
AST: 15 (ref 13–35)
Alkaline Phosphatase: 69 (ref 25–125)
Bilirubin, Total: 0.8

## 2023-03-17 LAB — TSH: TSH: 2.12 (ref 0.41–5.90)

## 2023-03-17 LAB — COMPREHENSIVE METABOLIC PANEL
Albumin: 4.4 (ref 3.5–5.0)
Calcium: 9.7 (ref 8.7–10.7)
Globulin: 2.7
eGFR: 54

## 2023-03-17 LAB — CBC AND DIFFERENTIAL
HCT: 39 (ref 36–46)
Hemoglobin: 12 (ref 12.0–16.0)
Neutrophils Absolute: 1.8
Platelets: 202 10*3/uL (ref 150–400)
WBC: 4.4

## 2023-03-17 LAB — LIPID PANEL
Cholesterol: 165 (ref 0–200)
HDL: 64 (ref 35–70)
LDL Cholesterol: 82
Triglycerides: 104 (ref 40–160)

## 2023-03-31 DIAGNOSIS — E785 Hyperlipidemia, unspecified: Secondary | ICD-10-CM | POA: Diagnosis not present

## 2023-03-31 DIAGNOSIS — I10 Essential (primary) hypertension: Secondary | ICD-10-CM | POA: Diagnosis not present

## 2023-03-31 DIAGNOSIS — E041 Nontoxic single thyroid nodule: Secondary | ICD-10-CM | POA: Diagnosis not present

## 2023-03-31 DIAGNOSIS — E049 Nontoxic goiter, unspecified: Secondary | ICD-10-CM | POA: Diagnosis not present

## 2023-03-31 DIAGNOSIS — E039 Hypothyroidism, unspecified: Secondary | ICD-10-CM | POA: Diagnosis not present

## 2023-04-06 ENCOUNTER — Telehealth: Payer: Self-pay | Admitting: Internal Medicine

## 2023-04-07 ENCOUNTER — Encounter: Payer: Self-pay | Admitting: Nurse Practitioner

## 2023-04-07 ENCOUNTER — Ambulatory Visit (INDEPENDENT_AMBULATORY_CARE_PROVIDER_SITE_OTHER): Payer: Medicare HMO | Admitting: Nurse Practitioner

## 2023-04-07 VITALS — BP 130/76 | HR 66 | Temp 98.1°F | Ht 63.0 in | Wt 133.2 lb

## 2023-04-07 DIAGNOSIS — E1122 Type 2 diabetes mellitus with diabetic chronic kidney disease: Secondary | ICD-10-CM | POA: Diagnosis not present

## 2023-04-07 DIAGNOSIS — R058 Other specified cough: Secondary | ICD-10-CM | POA: Diagnosis not present

## 2023-04-07 MED ORDER — BENZONATATE 100 MG PO CAPS: 100.0000 mg | ORAL_CAPSULE | Freq: Three times a day (TID) | ORAL | 1 refills | Status: DC | PRN

## 2023-04-07 MED ORDER — AMOXICILLIN 500 MG PO TABS: 500.0000 mg | ORAL_TABLET | Freq: Two times a day (BID) | ORAL | 0 refills | Status: AC

## 2023-04-07 NOTE — Progress Notes (Signed)
Established Patient Office Visit  Subjective:  Patient ID: Christina Cole, female    DOB: Nov 24, 1940  Age: 82 y.o. MRN: [id number]  CC:  Chief Complaint  Patient presents with   Cough    Not been able to sleep past 2-3 nights    HPI  Christina Cole presents for:  Cough This is a new problem. The current episode started in the past 7 days. The problem has been gradually worsening. The problem occurs hourly. The cough is Productive of sputum. Associated symptoms include ear pain, nasal congestion and postnasal drip. Pertinent negatives include no chest pain, fever, headaches, rhinorrhea, sore throat, shortness of breath or wheezing. Nothing aggravates the symptoms. She has tried cool air for the symptoms. The treatment provided mild relief. There is no history of asthma, COPD or environmental allergies.     Past Medical History:  Diagnosis Date   Heart murmur    Hyperlipidemia    Hypertension    Thyroid disease    goiter (follows with Dr. Patrecia Pace)    Past Surgical History:  Procedure Laterality Date   ABDOMINAL HYSTERECTOMY  1978   ovaries left in place   APPENDECTOMY  1978   BREAST BIOPSY Right 09/29/2017   Pt had Bx  done in [facility]'s office [facility] @ 3:00 per chart-"FIBROCYSTIC CHANGES".   COLONOSCOPY WITH PROPOFOL N/A 07/11/2017   Procedure: COLONOSCOPY WITH PROPOFOL;  Surgeon: Midge Minium, MD;  Location: [facility] ENDOSCOPY;  Service: Endoscopy;  Laterality: N/A;   TUBAL LIGATION      Family History  Problem Relation Age of Onset   Stroke Mother    Hypertension Mother    Arthritis Mother    Breast cancer Sister 58   Breast cancer Sister 28   Lung cancer Brother    Diabetes Brother     Social History   Socioeconomic History   Marital status: Single    Spouse name: Not on file   Number of children: 3   Years of education: Not on file   Highest education level: Not on file  Occupational History   Not on file  Tobacco Use   Smoking status: Former    Smokeless tobacco: Never  Vaping Use   Vaping Use: Never used  Substance and Sexual Activity   Alcohol use: No    Alcohol/week: 0.0 standard drinks of alcohol   Drug use: No   Sexual activity: Not on file  Other Topics Concern   Not on file  Social History Narrative   Twin sister died 12/11/21 and her brother died December 11, 2021    Social Determinants of Health   Financial Resource Strain: Low Risk  (12/30/2022)   Overall Financial Resource Strain (CARDIA)    Difficulty of Paying Living Expenses: Not hard at all  Food Insecurity: No Food Insecurity (12/30/2022)   Hunger Vital Sign    Worried About Running Out of Food in the Last Year: Never true    Ran Out of Food in the Last Year: Never true  Transportation Needs: No Transportation Needs (12/30/2022)   PRAPARE - Administrator, Civil Service (Medical): No    Lack of Transportation (Non-Medical): No  Physical Activity: Not on file  Stress: No Stress Concern Present (12/30/2022)   [employer] of Occupational Health - Occupational Stress Questionnaire    Feeling of Stress : Not at all  Social Connections: Unknown (12/30/2022)   Social Connection and Isolation Panel [NHANES]    Frequency of Communication with Friends and Family:  More than three times a week    Frequency of Social Gatherings with Friends and Family: More than three times a week    Attends Religious Services: More than 4 times per year    Active Member of Clubs or Organizations: Not on file    Attends Banker Meetings: More than 4 times per year    Marital Status: Not on file  Intimate Partner Violence: Not At Risk (12/30/2022)   Humiliation, Afraid, Rape, and Kick questionnaire    Fear of Current or Ex-Partner: No    Emotionally Abused: No    Physically Abused: No    Sexually Abused: No     Outpatient Medications Prior to Visit  Medication Sig Dispense Refill   atorvastatin (LIPITOR) 40 MG tablet TAKE 1 TABLET EVERY DAY 90 tablet 3   EPINEPHRINE  0.3 mg/0.3 mL IJ SOAJ injection USE AS DIRECTED 2 each 0   levothyroxine (SYNTHROID, LEVOTHROID) 25 MCG tablet Take 25 mcg by mouth daily before breakfast.     losartan (COZAAR) 100 MG tablet TAKE 1 TABLET EVERY DAY 90 tablet 3   TIADYLT ER 360 MG 24 hr capsule TAKE 1 CAPSULE EVERY DAY 90 capsule 3   triamterene-hydrochlorothiazide (MAXZIDE-25) 37.5-25 MG tablet Take 1/2 tablet by mouth daily. 45 tablet 1   No facility-administered medications prior to visit.    Allergies  Allergen Reactions   Sulfa Antibiotics Rash    ROS Review of Systems  Constitutional:  Negative for fever.  HENT:  Positive for ear pain and postnasal drip. Negative for rhinorrhea and sore throat.   Respiratory:  Positive for cough. Negative for shortness of breath and wheezing.   Cardiovascular:  Negative for chest pain.  Allergic/Immunologic: Negative for environmental allergies.  Neurological:  Negative for headaches.   Negative unless indicated in HPI.    Objective:    Physical Exam Constitutional:      Appearance: Normal appearance.  HENT:     Head: Normocephalic.     Mouth/Throat:     Mouth: Mucous membranes are moist.     Pharynx: Oropharynx is clear. No posterior oropharyngeal erythema.  Cardiovascular:     Rate and Rhythm: Normal rate and regular rhythm.     Pulses: Normal pulses.     Heart sounds: Normal heart sounds. No murmur heard. Pulmonary:     Effort: Pulmonary effort is normal.     Breath sounds: Normal breath sounds.  Neurological:     General: No focal deficit present.     Mental Status: She is alert and oriented to person, place, and time. Mental status is at baseline.  Psychiatric:        Mood and Affect: Mood normal.        Behavior: Behavior normal.     BP 130/76   Pulse 66   Temp 98.1 F (36.7 C) (Oral)   Ht 5\' 3"  (1.6 m)   Wt 133 lb 3.2 oz (60.4 kg)   LMP 12/21/1976   SpO2 98%   BMI 23.60 kg/m  Wt Readings from Last 3 Encounters:  04/07/23 133 lb 3.2 oz (60.4  kg)  02/09/23 133 lb 12.8 oz (60.7 kg)  12/30/22 130 lb (59 kg)     Health Maintenance  Topic Date Due   COVID-19 Vaccine (4 - 2023-24 season) 07/01/2022   Diabetic kidney evaluation - Urine ACR  04/19/2023   Zoster Vaccines- Shingrix (1 of 2) 05/11/2023 (Originally [birth year]/1962)   Colonoscopy  10/08/2023 (Originally 07/11/2022)   INFLUENZA  VACCINE  06/01/2023   HEMOGLOBIN A1C  08/09/2023   MAMMOGRAM  09/13/2023   OPHTHALMOLOGY EXAM  12/28/2023   Medicare Annual Wellness (AWV)  12/30/2023   Diabetic kidney evaluation - eGFR measurement  02/07/2024   FOOT EXAM  02/09/2024   DTaP/Tdap/Td (3 - Td or Tdap) 10/21/2028   Pneumonia Vaccine 52+ Years old  Completed   DEXA SCAN  Completed   Hepatitis C Screening  Completed   HPV VACCINES  Aged Out    There are no preventive care reminders to display for this patient.  Lab Results  Component Value Date   TSH 0.90 10/04/2022   Lab Results  Component Value Date   WBC 4.1 06/27/2022   HGB 12.3 06/27/2022   HCT 37.0 06/27/2022   MCV 87.5 06/27/2022   PLT 168.0 06/27/2022   Lab Results  Component Value Date   NA 138 02/07/2023   K 3.5 02/07/2023   CO2 27 02/07/2023   GLUCOSE 93 02/07/2023   BUN 16 02/07/2023   CREATININE 1.08 02/07/2023   BILITOT 0.7 02/07/2023   ALKPHOS 52 02/07/2023   AST 13 02/07/2023   ALT 14 02/07/2023   PROT 7.1 02/07/2023   ALBUMIN 4.1 02/07/2023   CALCIUM 9.2 02/07/2023   ANIONGAP 10 10/21/2018   GFR 48.78 (L) 02/07/2023   Lab Results  Component Value Date   CHOL 151 02/07/2023   Lab Results  Component Value Date   HDL 55.30 02/07/2023   Lab Results  Component Value Date   LDLCALC 77 02/07/2023   Lab Results  Component Value Date   TRIG 91.0 02/07/2023   Lab Results  Component Value Date   CHOLHDL 3 02/07/2023   Lab Results  Component Value Date   HGBA1C 6.4 02/07/2023      Assessment & Plan:  Other cough Assessment & Plan: Will treat with amoxicillin and benzonatate  Perles. Advised patient to do warm salt water gargles, increase fluid intake and use humidifier or steam. If symptoms not improving call the office for further evaluation.   Other orders -     Amoxicillin; Take 1 tablet (500 mg total) by mouth 2 (two) times daily for 7 days.  Dispense: 14 tablet; Refill: 0 -     Benzonatate; Take 1 capsule (100 mg total) by mouth 3 (three) times daily as needed for cough.  Dispense: 30 capsule; Refill: 1    Follow-up: No follow-ups on file.   Kara Dies, NP

## 2023-04-07 NOTE — Patient Instructions (Addendum)
Amoxicillin and Tessalon perls sent to the pharmacy. If the cough medication not covered by insurance take OTC mucinex for cough. Warm salt water gargles and drink more fluid.

## 2023-04-20 DIAGNOSIS — R059 Cough, unspecified: Secondary | ICD-10-CM | POA: Insufficient documentation

## 2023-04-20 NOTE — Assessment & Plan Note (Signed)
Will treat with amoxicillin and benzonatate Perles. Advised patient to do warm salt water gargles, increase fluid intake and use humidifier or steam. If symptoms not improving call the office for further evaluation.

## 2023-06-09 ENCOUNTER — Other Ambulatory Visit: Payer: Medicare HMO

## 2023-06-09 DIAGNOSIS — E7800 Pure hypercholesterolemia, unspecified: Secondary | ICD-10-CM | POA: Diagnosis not present

## 2023-06-09 DIAGNOSIS — I10 Essential (primary) hypertension: Secondary | ICD-10-CM

## 2023-06-09 DIAGNOSIS — E1165 Type 2 diabetes mellitus with hyperglycemia: Secondary | ICD-10-CM | POA: Diagnosis not present

## 2023-06-09 LAB — HEPATIC FUNCTION PANEL
ALT: 11 U/L (ref 0–35)
AST: 13 U/L (ref 0–37)
Albumin: 4.2 g/dL (ref 3.5–5.2)
Alkaline Phosphatase: 53 U/L (ref 39–117)
Bilirubin, Direct: 0.2 mg/dL (ref 0.0–0.3)
Total Bilirubin: 0.8 mg/dL (ref 0.2–1.2)
Total Protein: 7 g/dL (ref 6.0–8.3)

## 2023-06-09 LAB — BASIC METABOLIC PANEL
BUN: 16 mg/dL (ref 6–23)
CO2: 29 mEq/L (ref 19–32)
Calcium: 9.5 mg/dL (ref 8.4–10.5)
Chloride: 100 mEq/L (ref 96–112)
Creatinine, Ser: 0.97 mg/dL (ref 0.40–1.20)
GFR: 55.36 mL/min — ABNORMAL LOW (ref >60.00)
Glucose, Bld: 94 mg/dL (ref 70–99)
Potassium: 3.7 mEq/L (ref 3.5–5.1)
Sodium: 137 mEq/L (ref 135–145)

## 2023-06-09 LAB — LIPID PANEL
Cholesterol: 160 mg/dL (ref 0–200)
HDL: 54.3 mg/dL (ref >39.00)
LDL Cholesterol: 84 mg/dL (ref 0–99)
NonHDL: 105.86
Total CHOL/HDL Ratio: 3
Triglycerides: 109 mg/dL (ref 0.0–149.0)
VLDL: 21.8 mg/dL (ref 0.0–40.0)

## 2023-06-09 LAB — HEMOGLOBIN A1C: Hgb A1c MFr Bld: 6.4 % (ref 4.6–6.5)

## 2023-06-13 ENCOUNTER — Ambulatory Visit (INDEPENDENT_AMBULATORY_CARE_PROVIDER_SITE_OTHER): Payer: Medicare HMO | Admitting: Internal Medicine

## 2023-06-13 ENCOUNTER — Encounter: Payer: Self-pay | Admitting: Internal Medicine

## 2023-06-13 ENCOUNTER — Telehealth: Payer: Self-pay | Admitting: Internal Medicine

## 2023-06-13 VITALS — BP 126/68 | HR 70 | Temp 97.8°F | Resp 16 | Ht 63.0 in | Wt 134.0 lb

## 2023-06-13 DIAGNOSIS — E7800 Pure hypercholesterolemia, unspecified: Secondary | ICD-10-CM

## 2023-06-13 DIAGNOSIS — Z1231 Encounter for screening mammogram for malignant neoplasm of breast: Secondary | ICD-10-CM | POA: Diagnosis not present

## 2023-06-13 DIAGNOSIS — R011 Cardiac murmur, unspecified: Secondary | ICD-10-CM

## 2023-06-13 DIAGNOSIS — D649 Anemia, unspecified: Secondary | ICD-10-CM

## 2023-06-13 DIAGNOSIS — E1165 Type 2 diabetes mellitus with hyperglycemia: Secondary | ICD-10-CM

## 2023-06-13 DIAGNOSIS — Z0000 Encounter for general adult medical examination without abnormal findings: Secondary | ICD-10-CM | POA: Diagnosis not present

## 2023-06-13 DIAGNOSIS — N1831 Chronic kidney disease, stage 3a: Secondary | ICD-10-CM | POA: Diagnosis not present

## 2023-06-13 DIAGNOSIS — I700 Atherosclerosis of aorta: Secondary | ICD-10-CM

## 2023-06-13 DIAGNOSIS — E2839 Other primary ovarian failure: Secondary | ICD-10-CM | POA: Diagnosis not present

## 2023-06-13 DIAGNOSIS — I10 Essential (primary) hypertension: Secondary | ICD-10-CM | POA: Diagnosis not present

## 2023-06-13 MED ORDER — TRIAMTERENE-HCTZ 37.5-25 MG PO TABS: ORAL_TABLET | ORAL | 1 refills | Status: DC

## 2023-06-13 NOTE — Assessment & Plan Note (Signed)
Continue lipitor.  Low cholesterol diet and exercise.  Follow lipid panel and liver function tests.   

## 2023-06-13 NOTE — Assessment & Plan Note (Signed)
Continue lipitor  ?

## 2023-06-13 NOTE — Telephone Encounter (Signed)
FYI  Pt is going to see Dr Standley Brooking at [facility] endo

## 2023-06-13 NOTE — Assessment & Plan Note (Signed)
Stay hydrated. Avoid antiinflammatories.  Continue losartan.  Follow metabolic panel. GFR improved - 55 - recent check.

## 2023-06-13 NOTE — Assessment & Plan Note (Addendum)
Physical today 06/13/23.  Mammogram 09/12/22 - Birads I.  Colonoscopy 07/2017.  Recommended f/u in 5 years. Per note and pt - no further colonoscopy

## 2023-06-13 NOTE — Assessment & Plan Note (Signed)
Blood pressure as outlined. Continue losartan and triam/hctz 37.5/25 - 1/2 tablet q day.  On potassium q day. Blood pressure doing well. Follow pressures.  Follow metabolic panel.

## 2023-06-13 NOTE — Assessment & Plan Note (Signed)
Low carb diet and exercise given elevated blood sugars.  Follow met b and a1c.  Lab Results  Component Value Date   HGBA1C 6.4 06/09/2023

## 2023-06-13 NOTE — Telephone Encounter (Signed)
Pt called stating the cma wanted to know the [facility] clinic doctor name which is roliah

## 2023-06-13 NOTE — Progress Notes (Signed)
Subjective:    Patient ID: Christina Cole, female    DOB: 19-Oct-1941, 82 y.o.   MRN: [id number]  Patient here for  Chief Complaint  Patient presents with   Annual Exam    HPI Here for physical exam. Continues on losartan and triam/hydrochlorothiazide.  Discussed recent labs.  A1c 6.4. kidney function improved from last check.  GFR 55.  Stays active.  No chest pain or sob reported.  No cough or congestion.  No abdominal pain or cramping.  Bowels doing well.  Blood pressure doing well - reviewed outside checks - 121/69, 127/64.  Discussed bone density. Agreeable to schedule.    Past Medical History:  Diagnosis Date   Heart murmur    Hyperlipidemia    Hypertension    Thyroid disease    goiter (follows with Dr. Patrecia Pace)   Past Surgical History:  Procedure Laterality Date   ABDOMINAL HYSTERECTOMY  1978   ovaries left in place   APPENDECTOMY  1978   BREAST BIOPSY Right 09/29/2017   Pt had Bx  done in [facility] office [facility] @ 3:00 per chart-"FIBROCYSTIC CHANGES".   COLONOSCOPY WITH PROPOFOL N/A 07/11/2017   Procedure: COLONOSCOPY WITH PROPOFOL;  Surgeon: Midge Minium, MD;  Location: [facility] ENDOSCOPY;  Service: Endoscopy;  Laterality: N/A;   TUBAL LIGATION     Family History  Problem Relation Age of Onset   Stroke Mother    Hypertension Mother    Arthritis Mother    Breast cancer Sister 45   Breast cancer Sister 22   Lung cancer Brother    Diabetes Brother    Social History   Socioeconomic History   Marital status: Single    Spouse name: Not on file   Number of children: 3   Years of education: Not on file   Highest education level: Not on file  Occupational History   Not on file  Tobacco Use   Smoking status: Former   Smokeless tobacco: Never  Vaping Use   Vaping status: Never Used  Substance and Sexual Activity   Alcohol use: No    Alcohol/week: 0.0 standard drinks of alcohol   Drug use: No   Sexual activity: Not on file  Other Topics Concern   Not on file   Social History Narrative   Twin sister died 12-08-2021 and her brother died 12/08/2021    Social Determinants of Health   Financial Resource Strain: Low Risk  (12/30/2022)   Overall Financial Resource Strain (CARDIA)    Difficulty of Paying Living Expenses: Not hard at all  Food Insecurity: No Food Insecurity (12/30/2022)   Hunger Vital Sign    Worried About Running Out of Food in the Last Year: Never true    Ran Out of Food in the Last Year: Never true  Transportation Needs: No Transportation Needs (12/30/2022)   PRAPARE - Administrator, Civil Service (Medical): No    Lack of Transportation (Non-Medical): No  Physical Activity: Not on file  Stress: No Stress Concern Present (12/30/2022)   [employer] of Occupational Health - Occupational Stress Questionnaire    Feeling of Stress : Not at all  Social Connections: Unknown (12/30/2022)   Social Connection and Isolation Panel [NHANES]    Frequency of Communication with Friends and Family: More than three times a week    Frequency of Social Gatherings with Friends and Family: More than three times a week    Attends Religious Services: More than 4 times per year  Active Member of Clubs or Organizations: Not on file    Attends Club or Organization Meetings: More than 4 times per year    Marital Status: Not on file     Review of Systems  Constitutional:  Negative for appetite change and unexpected weight change.  HENT:  Negative for congestion, sinus pressure and sore throat.   Eyes:  Negative for pain and visual disturbance.  Respiratory:  Negative for cough, chest tightness and shortness of breath.   Cardiovascular:  Negative for chest pain, palpitations and leg swelling.  Gastrointestinal:  Negative for abdominal pain, constipation, diarrhea and vomiting.  Genitourinary:  Negative for difficulty urinating and dysuria.  Musculoskeletal:  Negative for joint swelling and myalgias.  Skin:  Negative for color change and rash.        Does report intermittent itching - just face.  No rash.  Discussed dry skin.  Desires no further evaluation or w/up.   Neurological:  Negative for dizziness and headaches.  Hematological:  Negative for adenopathy. Does not bruise/bleed easily.  Psychiatric/Behavioral:  Negative for agitation and dysphoric mood.        Objective:     BP 126/68   Pulse 70   Temp 97.8 F (36.6 C)   Resp 16   Ht 5\' 3"  (1.6 m)   Wt 134 lb (60.8 kg)   LMP 12/21/1976   SpO2 97%   BMI 23.74 kg/m  Wt Readings from Last 3 Encounters:  06/13/23 134 lb (60.8 kg)  04/07/23 133 lb 3.2 oz (60.4 kg)  02/09/23 133 lb 12.8 oz (60.7 kg)    Physical Exam Vitals reviewed.  Constitutional:      General: She is not in acute distress.    Appearance: Normal appearance. She is well-developed.  HENT:     Head: Normocephalic and atraumatic.     Right Ear: External ear normal.     Left Ear: External ear normal.  Eyes:     General: No scleral icterus.       Right eye: No discharge.        Left eye: No discharge.     Conjunctiva/sclera: Conjunctivae normal.  Neck:     Thyroid: No thyromegaly.  Cardiovascular:     Rate and Rhythm: Normal rate and regular rhythm.  Pulmonary:     Effort: No tachypnea, accessory muscle usage or respiratory distress.     Breath sounds: Normal breath sounds. No decreased breath sounds or wheezing.  Chest:  Breasts:    Right: No inverted nipple, mass, nipple discharge or tenderness (no axillary adenopathy).     Left: No inverted nipple, mass, nipple discharge or tenderness (no axilarry adenopathy).  Abdominal:     General: Bowel sounds are normal.     Palpations: Abdomen is soft.     Tenderness: There is no abdominal tenderness.  Musculoskeletal:        General: No swelling or tenderness.     Cervical back: Neck supple.  Lymphadenopathy:     Cervical: No cervical adenopathy.  Skin:    Findings: No erythema or rash.  Neurological:     Mental Status: She is alert and  oriented to person, place, and time.  Psychiatric:        Mood and Affect: Mood normal.        Behavior: Behavior normal.      Outpatient Encounter Medications as of 06/13/2023  Medication Sig   atorvastatin (LIPITOR) 40 MG tablet TAKE 1 TABLET EVERY DAY   EPINEPHRINE  0.3 mg/0.3 mL IJ SOAJ injection USE AS DIRECTED   levothyroxine (SYNTHROID, LEVOTHROID) 25 MCG tablet Take 25 mcg by mouth daily before breakfast.   losartan (COZAAR) 100 MG tablet TAKE 1 TABLET EVERY DAY   TIADYLT ER 360 MG 24 hr capsule TAKE 1 CAPSULE EVERY DAY   triamterene-hydrochlorothiazide (MAXZIDE-25) 37.5-25 MG tablet Take 1/2 tablet by mouth daily.   [DISCONTINUED] benzonatate (TESSALON PERLES) 100 MG capsule Take 1 capsule (100 mg total) by mouth 3 (three) times daily as needed for cough.   [DISCONTINUED] triamterene-hydrochlorothiazide (MAXZIDE-25) 37.5-25 MG tablet Take 1/2 tablet by mouth daily.   No facility-administered encounter medications on file as of 06/13/2023.     Lab Results  Component Value Date   WBC 4.4 03/17/2023   HGB 12.0 03/17/2023   HCT 39 03/17/2023   PLT 202 03/17/2023   GLUCOSE 94 06/09/2023   CHOL 160 06/09/2023   TRIG 109.0 06/09/2023   HDL 54.30 06/09/2023   LDLCALC 84 06/09/2023   ALT 11 06/09/2023   AST 13 06/09/2023   NA 137 06/09/2023   K 3.7 06/09/2023   CL 100 06/09/2023   CREATININE 0.97 06/09/2023   BUN 16 06/09/2023   CO2 29 06/09/2023   TSH 2.12 03/17/2023   HGBA1C 6.4 06/09/2023   MICROALBUR <0.7 04/18/2022    MM 3D SCREEN BREAST BILATERAL  Result Date: 09/14/2022 CLINICAL DATA:  Screening. EXAM: DIGITAL SCREENING BILATERAL MAMMOGRAM WITH TOMOSYNTHESIS AND CAD TECHNIQUE: Bilateral screening digital craniocaudal and mediolateral oblique mammograms were obtained. Bilateral screening digital breast tomosynthesis was performed. The images were evaluated with computer-aided detection. COMPARISON:  Previous exam(s). ACR Breast Density Category c: The breast tissue  is heterogeneously dense, which may obscure small masses. FINDINGS: There are no findings suspicious for malignancy. IMPRESSION: No mammographic evidence of malignancy. A result letter of this screening mammogram will be mailed directly to the patient. RECOMMENDATION: Screening mammogram in one year. (Code:SM-B-01Y) BI-RADS CATEGORY  1: Negative. Electronically Signed   By: Bary Richard M.D.   On: 09/14/2022 08:21       Assessment & Plan:  Routine general medical examination at a health care facility  Health care maintenance Assessment & Plan: Physical today 06/13/23.  Mammogram 09/12/22 - Birads I.  Colonoscopy 07/2017.  Recommended f/u in 5 years. Per note and pt - no further colonoscopy   Type 2 diabetes mellitus with hyperglycemia, without long-term current use of insulin (HCC) Assessment & Plan: Low carb diet and exercise given elevated blood sugars.  Follow met b and a1c.  Lab Results  Component Value Date   HGBA1C 6.4 06/09/2023    Orders: -     Microalbumin / creatinine urine ratio; Future -     Hemoglobin A1c; Future  Hypercholesterolemia Assessment & Plan: Continue lipitor.  Low cholesterol diet and exercise.  Follow lipid panel and liver function tests.   Orders: -     Lipid panel; Future -     Hepatic function panel; Future -     Basic metabolic panel; Future  Anemia, unspecified type Assessment & Plan: Follow cbc.   Orders: -     CBC with Differential/Platelet; Future  Visit for screening mammogram -     3D Screening Mammogram, Left and Right; Future  Estrogen deficiency -     DG Bone Density; Future  Aortic atherosclerosis (HCC) Assessment & Plan: Continue lipitor.    Stage 3a chronic kidney disease (HCC) Assessment & Plan: Stay hydrated. Avoid antiinflammatories.  Continue losartan.  Follow metabolic  panel. GFR improved - 55 - recent check.    Essential hypertension, benign Assessment & Plan: Blood pressure as outlined. Continue losartan and  triam/hctz 37.5/25 - 1/2 tablet q day.  On potassium q day. Blood pressure doing well. Follow pressures.  Follow metabolic panel.    Heart murmur Assessment & Plan: Saw cardiology.  ECHO as outlined.  Aortic sclerosis.  No symptoms.  No dizziness or light headedness.  No syncope or near syncope.     Other orders -     Triamterene-HCTZ; Take 1/2 tablet by mouth daily.  Dispense: 45 tablet; Refill: 1     Dale Newberry, MD

## 2023-06-13 NOTE — Assessment & Plan Note (Signed)
Saw cardiology.  ECHO as outlined.  Aortic sclerosis.  No symptoms.  No dizziness or light headedness.  No syncope or near syncope.

## 2023-06-13 NOTE — Assessment & Plan Note (Signed)
Follow cbc.  

## 2023-06-14 ENCOUNTER — Telehealth: Payer: Self-pay | Admitting: Internal Medicine

## 2023-06-14 DIAGNOSIS — E079 Disorder of thyroid, unspecified: Secondary | ICD-10-CM

## 2023-06-14 NOTE — Telephone Encounter (Signed)
Endocrinology referral placed.

## 2023-06-14 NOTE — Telephone Encounter (Signed)
Patient just called and wants to see if she can get referred to a thyroid doctor. Her number is [phone]. If you have any questions.

## 2023-07-13 ENCOUNTER — Telehealth: Payer: Self-pay

## 2023-07-13 NOTE — Patient Outreach (Signed)
  Care Coordination   Initial Visit Note   07/13/2023 Name: Christina Cole MRN: [id number] DOB: 05-27-1941  Christina Cole is a 82 y.o. year old female who sees Dale [place], MD for primary care. I spoke with  Rogene Houston by phone today.  What matters to the patients health and wellness today?  CM educated patient on [facility] services.  Patient declines services and feels that she is able to manage her medical needs.  Patient agreed to contact her provider in the future if she needs [facility] services.    Goals Addressed             This Visit's Progress    Care Coordination       Interventions Today    Flowsheet Row Most Recent Value  General Interventions   General Interventions Discussed/Reviewed General Interventions Discussed, General Interventions Reviewed  [Pt reports no concerns with transportation, food, medication or housing and Blood pressure is stable.]  Education Interventions   Education Provided Provided Education  [SW educated pt on Care Management services]              SDOH assessments and interventions completed:  No     Care Coordination Interventions:  Yes, provided   Follow up plan: No further intervention required.   Encounter Outcome:  Patient Refused

## 2023-07-24 DIAGNOSIS — E039 Hypothyroidism, unspecified: Secondary | ICD-10-CM | POA: Diagnosis not present

## 2023-07-24 DIAGNOSIS — E049 Nontoxic goiter, unspecified: Secondary | ICD-10-CM | POA: Diagnosis not present

## 2023-09-14 ENCOUNTER — Ambulatory Visit
Admission: RE | Admit: 2023-09-14 | Discharge: 2023-09-14 | Disposition: A | Discharge status: Home / Self Care | Payer: Medicare HMO | Source: Ambulatory Visit | Attending: Internal Medicine | Admitting: Internal Medicine

## 2023-09-14 DIAGNOSIS — M85852 Other specified disorders of bone density and structure, left thigh: Secondary | ICD-10-CM | POA: Diagnosis not present

## 2023-09-14 DIAGNOSIS — Z1231 Encounter for screening mammogram for malignant neoplasm of breast: Secondary | ICD-10-CM | POA: Insufficient documentation

## 2023-09-14 DIAGNOSIS — E2839 Other primary ovarian failure: Secondary | ICD-10-CM | POA: Diagnosis not present

## 2023-09-14 DIAGNOSIS — Z780 Asymptomatic menopausal state: Secondary | ICD-10-CM | POA: Diagnosis not present

## 2023-09-15 ENCOUNTER — Telehealth: Payer: Self-pay

## 2023-09-15 NOTE — Telephone Encounter (Signed)
Lvm for pt to give office a call back in regards to bone density, see message below

## 2023-09-15 NOTE — Telephone Encounter (Signed)
-----   Message from [place] sent at 09/15/2023  3:15 AM EST ----- Notify - bone density reveals osteopenia.  This means that she has had some bone loss, but does not meet criteria for osteoporosis.  Continue calcium, vitamin D and weight bearing exercise.

## 2023-09-18 NOTE — Telephone Encounter (Signed)
Patient just returned phone call. I read her the message.

## 2023-09-18 NOTE — Telephone Encounter (Signed)
Noted  

## 2023-10-11 ENCOUNTER — Other Ambulatory Visit (INDEPENDENT_AMBULATORY_CARE_PROVIDER_SITE_OTHER): Payer: Medicare HMO

## 2023-10-11 DIAGNOSIS — E7800 Pure hypercholesterolemia, unspecified: Secondary | ICD-10-CM | POA: Diagnosis not present

## 2023-10-11 DIAGNOSIS — D649 Anemia, unspecified: Secondary | ICD-10-CM

## 2023-10-11 DIAGNOSIS — E1165 Type 2 diabetes mellitus with hyperglycemia: Secondary | ICD-10-CM

## 2023-10-11 LAB — LIPID PANEL
Cholesterol: 153 mg/dL (ref 0–200)
HDL: 53.6 mg/dL (ref >39.00)
LDL Cholesterol: 80 mg/dL (ref 0–99)
NonHDL: 99.17
Total CHOL/HDL Ratio: 3
Triglycerides: 98 mg/dL (ref 0.0–149.0)
VLDL: 19.6 mg/dL (ref 0.0–40.0)

## 2023-10-11 LAB — CBC WITH DIFFERENTIAL/PLATELET
Basophils Absolute: 0 10*3/uL (ref 0.0–0.1)
Basophils Relative: 0.7 % (ref 0.0–3.0)
Eosinophils Absolute: 0.2 10*3/uL (ref 0.0–0.7)
Eosinophils Relative: 3.5 % (ref 0.0–5.0)
HCT: 37.7 % (ref 36.0–46.0)
Hemoglobin: 12.1 g/dL (ref 12.0–15.0)
Lymphocytes Relative: 44 % (ref 12.0–46.0)
Lymphs Abs: 2.4 10*3/uL (ref 0.7–4.0)
MCHC: 32.1 g/dL (ref 30.0–36.0)
MCV: 88 fL (ref 78.0–100.0)
Monocytes Absolute: 0.5 10*3/uL (ref 0.1–1.0)
Monocytes Relative: 8.9 % (ref 3.0–12.0)
Neutro Abs: 2.4 10*3/uL (ref 1.4–7.7)
Neutrophils Relative %: 42.9 % — ABNORMAL LOW (ref 43.0–77.0)
Platelets: 196 10*3/uL (ref 150.0–400.0)
RBC: 4.28 Mil/uL (ref 3.87–5.11)
RDW: 15.1 % (ref 11.5–15.5)
WBC: 5.5 10*3/uL (ref 4.0–10.5)

## 2023-10-11 LAB — BASIC METABOLIC PANEL
BUN: 19 mg/dL (ref 6–23)
CO2: 30 meq/L (ref 19–32)
Calcium: 9.3 mg/dL (ref 8.4–10.5)
Chloride: 101 meq/L (ref 96–112)
Creatinine, Ser: 1.06 mg/dL (ref 0.40–1.20)
GFR: 49.65 mL/min — ABNORMAL LOW (ref >60.00)
Glucose, Bld: 96 mg/dL (ref 70–99)
Potassium: 3.4 meq/L — ABNORMAL LOW (ref 3.5–5.1)
Sodium: 138 meq/L (ref 135–145)

## 2023-10-11 LAB — HEPATIC FUNCTION PANEL
ALT: 12 U/L (ref 0–35)
AST: 16 U/L (ref 0–37)
Albumin: 4.1 g/dL (ref 3.5–5.2)
Alkaline Phosphatase: 53 U/L (ref 39–117)
Bilirubin, Direct: 0.2 mg/dL (ref 0.0–0.3)
Total Bilirubin: 1.2 mg/dL (ref 0.2–1.2)
Total Protein: 7.3 g/dL (ref 6.0–8.3)

## 2023-10-11 LAB — MICROALBUMIN / CREATININE URINE RATIO
Creatinine,U: 12.9 mg/dL
Microalb Creat Ratio: 5.4 mg/g (ref 0.0–30.0)
Microalb, Ur: <0.7 mg/dL (ref 0.0–1.9)

## 2023-10-11 LAB — HEMOGLOBIN A1C: Hgb A1c MFr Bld: 6.4 % (ref 4.6–6.5)

## 2023-10-13 ENCOUNTER — Telehealth: Payer: Self-pay

## 2023-10-13 ENCOUNTER — Ambulatory Visit (INDEPENDENT_AMBULATORY_CARE_PROVIDER_SITE_OTHER): Payer: Medicare HMO | Admitting: Internal Medicine

## 2023-10-13 VITALS — BP 130/64 | HR 55 | Temp 97.9°F | Ht 63.0 in | Wt 136.6 lb

## 2023-10-13 DIAGNOSIS — Z91030 Bee allergy status: Secondary | ICD-10-CM

## 2023-10-13 DIAGNOSIS — E079 Disorder of thyroid, unspecified: Secondary | ICD-10-CM | POA: Diagnosis not present

## 2023-10-13 DIAGNOSIS — E7800 Pure hypercholesterolemia, unspecified: Secondary | ICD-10-CM

## 2023-10-13 DIAGNOSIS — E1165 Type 2 diabetes mellitus with hyperglycemia: Secondary | ICD-10-CM

## 2023-10-13 DIAGNOSIS — N1831 Chronic kidney disease, stage 3a: Secondary | ICD-10-CM

## 2023-10-13 DIAGNOSIS — I10 Essential (primary) hypertension: Secondary | ICD-10-CM

## 2023-10-13 DIAGNOSIS — I700 Atherosclerosis of aorta: Secondary | ICD-10-CM

## 2023-10-13 DIAGNOSIS — R1310 Dysphagia, unspecified: Secondary | ICD-10-CM

## 2023-10-13 DIAGNOSIS — R001 Bradycardia, unspecified: Secondary | ICD-10-CM | POA: Insufficient documentation

## 2023-10-13 MED ORDER — TRIAMTERENE-HCTZ 37.5-25 MG PO TABS: ORAL_TABLET | ORAL | 1 refills | Status: DC

## 2023-10-13 MED ORDER — ATORVASTATIN CALCIUM 40 MG PO TABS: 40.0000 mg | ORAL_TABLET | Freq: Every day | ORAL | 3 refills | Status: DC

## 2023-10-13 MED ORDER — EPINEPHRINE 0.3 MG/0.3ML IJ SOAJ: INTRAMUSCULAR | 1 refills | Status: AC

## 2023-10-13 MED ORDER — DILTIAZEM HCL ER COATED BEADS 240 MG PO CP24: 240.0000 mg | ORAL_CAPSULE | Freq: Every day | ORAL | 1 refills | Status: DC

## 2023-10-13 MED ORDER — PANTOPRAZOLE SODIUM 40 MG PO TBEC: 40.0000 mg | DELAYED_RELEASE_TABLET | Freq: Every day | ORAL | 1 refills | Status: DC

## 2023-10-13 MED ORDER — DILTIAZEM HCL ER BEADS 360 MG PO CP24: 360.0000 mg | ORAL_CAPSULE | Freq: Every day | ORAL | 3 refills | Status: DC

## 2023-10-13 MED ORDER — LOSARTAN POTASSIUM 100 MG PO TABS: 100.0000 mg | ORAL_TABLET | Freq: Every day | ORAL | 3 refills | Status: DC

## 2023-10-13 NOTE — Patient Instructions (Signed)
Stop diltiazem 360mg .  Start diltiazem 240mg  one per day.

## 2023-10-13 NOTE — Telephone Encounter (Signed)
Error

## 2023-10-13 NOTE — Progress Notes (Unsigned)
Subjective:    Patient ID: Christina Cole, female    DOB: Nov 10, 1940, 82 y.o.   MRN: [id number]  Patient here for  Chief Complaint  Patient presents with   Medical Management of Chronic Issues    4 month follow up    HPI Here for a scheduled follow up - f/u regarding diabetes, hypertension and hypercholesterolemia. Saw endocrinology for f/u thyroid goiter. On synthroid. Recommended f/u ultrasound in 02/2024. She reports noticing some fullness - lower chest. Some occasional acid reflux. Describes pill (or food) - feeling like getting stuck. Some nausea. Last night - soft stool. No increased diarrhea. No sob. No increased cough or congestion. Denies syncope or near syncope. Reports occasionally will notice when stands up - occasional light headedness.    Past Medical History:  Diagnosis Date   Heart murmur    Hyperlipidemia    Hypertension    Thyroid disease    goiter (follows with Dr. Patrecia Pace)   Past Surgical History:  Procedure Laterality Date   ABDOMINAL HYSTERECTOMY  1978   ovaries left in place   APPENDECTOMY  1978   BREAST BIOPSY Right 09/29/2017   Pt had Bx  done in [facility] office [facility] @ 3:00 per chart-"FIBROCYSTIC CHANGES".   COLONOSCOPY WITH PROPOFOL N/A 07/11/2017   Procedure: COLONOSCOPY WITH PROPOFOL;  Surgeon: Midge Minium, MD;  Location: [facility] ENDOSCOPY;  Service: Endoscopy;  Laterality: N/A;   TUBAL LIGATION     Family History  Problem Relation Age of Onset   Stroke Mother    Hypertension Mother    Arthritis Mother    Breast cancer Sister 84   Breast cancer Sister 71   Lung cancer Brother    Diabetes Brother    Social History   Socioeconomic History   Marital status: Single    Spouse name: Not on file   Number of children: 3   Years of education: Not on file   Highest education level: Not on file  Occupational History   Not on file  Tobacco Use   Smoking status: Former   Smokeless tobacco: Never  Vaping Use   Vaping status: Never Used   Substance and Sexual Activity   Alcohol use: No    Alcohol/week: 0.0 standard drinks of alcohol   Drug use: No   Sexual activity: Not on file  Other Topics Concern   Not on file  Social History Narrative   Twin sister died 12-16-21 and her brother died 12-16-21    Social Drivers of Health   Financial Resource Strain: Low Risk  (07/24/2023)   Received from [facility]   Overall Financial Resource Strain (CARDIA)    Difficulty of Paying Living Expenses: Not hard at all  Food Insecurity: No Food Insecurity (07/24/2023)   Received from [facility]   Hunger Vital Sign    Worried About Running Out of Food in the Last Year: Never true    Ran Out of Food in the Last Year: Never true  Transportation Needs: No Transportation Needs (07/24/2023)   Received from [facility]   PRAPARE - Transportation    In the past 12 months, has lack of transportation kept you from medical appointments or from getting medications?: No    Lack of Transportation (Non-Medical): No  Physical Activity: Not on file  Stress: No Stress Concern Present (12/30/2022)   [employer] of Occupational Health - Occupational Stress Questionnaire    Feeling of Stress : Not at all  Social Connections: Unknown (12/30/2022)   Social Connection and Isolation Panel [NHANES]    Frequency of Communication with Friends and Family: More than three times a week    Frequency of Social Gatherings with Friends and Family: More than three times a week    Attends Religious Services: More than 4 times per year    Active Member of [employer] or Organizations: Not on file    Attends Engineer, structural: More than 4 times per year    Marital Status: Not on file     Review of Systems  Constitutional:  Negative for appetite change and unexpected weight change.  HENT:  Negative for congestion and sinus pressure.   Respiratory:  Negative for cough, chest tightness and shortness of  breath.   Cardiovascular:  Negative for chest pain and palpitations.  Gastrointestinal:  Positive for nausea. Negative for diarrhea and vomiting.       Some acid reflux. Dysphagia - pill/food.   Genitourinary:  Negative for difficulty urinating and dysuria.  Musculoskeletal:  Negative for joint swelling and myalgias.  Skin:  Negative for color change and rash.  Neurological:  Negative for dizziness and headaches.       Occasional light headedness.   Psychiatric/Behavioral:  Negative for agitation and dysphoric mood.        Objective:     BP 130/64   Pulse (!) 55   Temp 97.9 F (36.6 C)   Ht 5\' 3"  (1.6 m)   Wt 136 lb 9.6 oz (62 kg)   LMP 12/21/1976   SpO2 100%   BMI 24.20 kg/m  Wt Readings from Last 3 Encounters:  10/13/23 136 lb 9.6 oz (62 kg)  06/13/23 134 lb (60.8 kg)  04/07/23 133 lb 3.2 oz (60.4 kg)    Physical Exam Vitals reviewed.  Constitutional:      General: She is not in acute distress.    Appearance: Normal appearance.  HENT:     Head: Normocephalic and atraumatic.     Right Ear: External ear normal.     Left Ear: External ear normal.     Mouth/Throat:     Pharynx: No oropharyngeal exudate or posterior oropharyngeal erythema.  Eyes:     General: No scleral icterus.       Right eye: No discharge.        Left eye: No discharge.     Conjunctiva/sclera: Conjunctivae normal.  Neck:     Thyroid: No thyromegaly.  Cardiovascular:     Rate and Rhythm: Normal rate and regular rhythm.  Pulmonary:     Effort: No respiratory distress.     Breath sounds: Normal breath sounds. No wheezing.  Abdominal:     General: Bowel sounds are normal.     Palpations: Abdomen is soft.     Tenderness: There is no abdominal tenderness.  Musculoskeletal:        General: No swelling or tenderness.     Cervical back: Neck supple. No tenderness.  Lymphadenopathy:     Cervical: No cervical adenopathy.  Skin:    Findings: No erythema or rash.  Neurological:     Mental Status:  She is alert.  Psychiatric:        Mood and Affect: Mood normal.        Behavior: Behavior normal.      Outpatient Encounter Medications as of 10/13/2023  Medication Sig   diltiazem (CARDIZEM CD) 240 MG 24 hr capsule Take 1 capsule (240 mg total) by mouth daily.  levothyroxine (SYNTHROID, LEVOTHROID) 25 MCG tablet Take 25 mcg by mouth daily before breakfast.   pantoprazole (PROTONIX) 40 MG tablet Take 1 tablet (40 mg total) by mouth daily.   [DISCONTINUED] EPINEPHRINE 0.3 mg/0.3 mL IJ SOAJ injection USE AS DIRECTED   atorvastatin (LIPITOR) 40 MG tablet Take 1 tablet (40 mg total) by mouth daily.   EPINEPHrine 0.3 mg/0.3 mL IJ SOAJ injection USE AS DIRECTED   losartan (COZAAR) 100 MG tablet Take 1 tablet (100 mg total) by mouth daily.   triamterene-hydrochlorothiazide (MAXZIDE-25) 37.5-25 MG tablet Take 1/2 tablet by mouth daily.   [DISCONTINUED] atorvastatin (LIPITOR) 40 MG tablet TAKE 1 TABLET EVERY DAY   [DISCONTINUED] diltiazem (TIADYLT ER) 360 MG 24 hr capsule Take 1 capsule (360 mg total) by mouth daily.   [DISCONTINUED] losartan (COZAAR) 100 MG tablet TAKE 1 TABLET EVERY DAY   [DISCONTINUED] TIADYLT ER 360 MG 24 hr capsule TAKE 1 CAPSULE EVERY DAY   [DISCONTINUED] triamterene-hydrochlorothiazide (MAXZIDE-25) 37.5-25 MG tablet Take 1/2 tablet by mouth daily.   No facility-administered encounter medications on file as of 10/13/2023.     Lab Results  Component Value Date   WBC 5.5 10/11/2023   HGB 12.1 10/11/2023   HCT 37.7 10/11/2023   PLT 196.0 10/11/2023   GLUCOSE 96 10/11/2023   CHOL 153 10/11/2023   TRIG 98.0 10/11/2023   HDL 53.60 10/11/2023   LDLCALC 80 10/11/2023   ALT 12 10/11/2023   AST 16 10/11/2023   NA 138 10/11/2023   K 3.4 (L) 10/11/2023   CL 101 10/11/2023   CREATININE 1.06 10/11/2023   BUN 19 10/11/2023   CO2 30 10/11/2023   TSH 2.12 03/17/2023   HGBA1C 6.4 10/11/2023   MICROALBUR <0.7 10/11/2023    MM 3D SCREENING MAMMOGRAM BILATERAL  BREAST Result Date: 09/15/2023 CLINICAL DATA:  Screening. EXAM: DIGITAL SCREENING BILATERAL MAMMOGRAM WITH TOMOSYNTHESIS AND CAD TECHNIQUE: Bilateral screening digital craniocaudal and mediolateral oblique mammograms were obtained. Bilateral screening digital breast tomosynthesis was performed. The images were evaluated with computer-aided detection. COMPARISON:  Previous exam(s). ACR Breast Density Category c: The breasts are heterogeneously dense, which may obscure small masses. FINDINGS: There are no findings suspicious for malignancy. IMPRESSION: No mammographic evidence of malignancy. A result letter of this screening mammogram will be mailed directly to the patient. RECOMMENDATION: Screening mammogram in one year. (Code:SM-B-01Y) BI-RADS CATEGORY  1: Negative. Electronically Signed   By: Frederico Hamman M.D.   On: 09/15/2023 16:32   DG Bone Density Result Date: 09/14/2023 EXAM: DUAL X-RAY ABSORPTIOMETRY (DXA) FOR BONE MINERAL DENSITY IMPRESSION: Dear Dr Lorin Picket, Your patient CHRISSETTE BOOKWALTER completed a FRAX assessment on 09/14/2023 using the Lunar iDXA DXA System (analysis version: 14.10) manufactured by [employer]. The following summarizes the results of our evaluation. PATIENT BIOGRAPHICAL: Name: Boyce, Murdough Patient ID: [id number] Birth Date: 08-09-1941 Height:    63.0 in. Gender:     Female    [age]       Weight:    134.0 lbs. Ethnicity:  Black                            Exam Date: 09/14/2023 FRAX* RESULTS:  (version: 3.5) 10-year Probability of Fracture1 Major Osteoporotic Fracture2 Hip Fracture 6.0% 1.4% Population: [place] (Black) Risk Factors: None Based on Femur (Left) Neck BMD 1 -The 10-year probability of fracture may be lower than reported if the patient has received treatment.  2 -Major Osteoporotic Fracture: Clinical Spine, Forearm, Hip or Shoulder *FRAX is a Armed forces logistics/support/administrative officer of the [employer] of [employer] for Metabolic Bone Disease, a World Science writer (WHO)  [employer]. ASSESSMENT: The probability of a major osteoporotic fracture is 6.0% within the next ten years. The probability of a hip fracture is 1.4% within the next ten years. . Your patient Itcel Buttry completed a BMD test on 09/14/2023 using the [employer] DXA System (software version: 14.10) manufactured by [employer]. The following summarizes the results of our evaluation. Technologist: MTB PATIENT BIOGRAPHICAL: Name: Kaneesha, Beddoe Patient ID: [id number] Birth Date: 1940-11-19 Height: 63.0 in. Gender: Female Exam Date: 09/14/2023 Weight: 134.0 lbs. Indications: Hysterectomy, Postmenopausal Fractures: Treatments: DENSITOMETRY RESULTS: Site         Region     Measured Date Measured Age WHO Classification Young Adult T-score BMD         %Change vs. Previous Significant Change (*) AP Spine L1-L3 09/14/2023 80.3 Normal -0.1 1.173 g/cm2 - - DualFemur Neck Left 09/14/2023 80.3 Osteopenia -1.5 0.833 g/cm2 - - DualFemur Total Mean 09/14/2023 80.3 Normal -0.7 0.915 g/cm2 - - Left Forearm Radius 33% 09/14/2023 80.3 Normal 0.0 0.878 g/cm2 - - ASSESSMENT: The BMD measured at Femur Neck Left is 0.833 g/cm2 with a T-score of -1.5. This patient's diagnostic category is LOW BONE MASS/OSTEOPENIA according to World Health Organization [facility]) criteria. The scan quality is good. L-4 was excluded due to degenerative changes. World Science writer [facility]) criteria for post-menopausal, Caucasian Women: Normal:                   T-score at or above -1 SD Osteopenia/low bone mass: T-score between -1 and -2.5 SD Osteoporosis:             T-score at or below -2.5 SD RECOMMENDATIONS: 1. All patients should optimize calcium and vitamin D intake. 2. Consider FDA-approved medical therapies in postmenopausal women and men aged 89 years and older, based on the following: a. A hip or vertebral(clinical or morphometric) fracture b. T-score < -2.5 at the femoral neck or spine after appropriate evaluation to exclude secondary  causes c. Low bone mass (T-score between -1.0 and -2.5 at the femoral neck or spine) and a 10-year probability of a hip fracture > 3% or a 10-year probability of a major osteoporosis-related fracture > 20% based on the [place]-adapted WHO algorithm 3. Clinician judgment and/or patient preferences may indicate treatment for people with 10-year fracture probabilities above or below these levels FOLLOW-UP: People with diagnosed cases of osteoporosis or at high risk for fracture should have regular bone mineral density tests. For patients eligible for Medicare, routine testing is allowed once every 2 years. The testing frequency can be increased to one year for patients who have rapidly progressing disease, those who are receiving or discontinuing medical therapy to restore bone mass, or have additional risk factors. I have reviewed this report, and agree with the above findings. [facility] Electronically Signed   By: Harmon Pier M.D.   On: 09/14/2023 11:45       Assessment & Plan:  Type 2 diabetes mellitus with hyperglycemia, without long-term current use of insulin (HCC) Assessment & Plan: Low carb diet and exercise given elevated blood sugars.  Follow met b and a1c.  Lab Results  Component Value Date   HGBA1C 6.4 10/11/2023    Orders: -     Hemoglobin A1c; Future  Hypercholesterolemia Assessment & Plan: Continue lipitor.  Low cholesterol diet and exercise.  Follow lipid panel and liver function tests.   Orders: -     Lipid panel; Future -     Hepatic function panel; Future -     Basic metabolic panel; Future  History of bee sting allergy -     EPINEPHrine; USE AS DIRECTED  Dispense: 2 each; Refill: 1  Bradycardia Assessment & Plan: Symptoms as outlined. Heart rate 40s/50s. Decrease cardizem to 240mg . Follow blood pressure and pulse rate. Get her back in soon to reassess.   Orders: -     EKG 12-Lead  Thyroid disease Assessment & Plan: Saw endocrinology - 07/2023 - plan  thyroid ultrasound 02/2024.    Essential hypertension, benign Assessment & Plan: Blood pressure as outlined. Continue losartan and triam/hctz 37.5/25 - 1/2 tablet q day.  Also on cardizem 360mg  q day. Decrease to 240mg  as outlined. On potassium q day. Follow pressures.  Follow metabolic panel.    Stage 3a chronic kidney disease (HCC) Assessment & Plan: Stay hydrated. Avoid antiinflammatories.  Continue losartan.  Follow metabolic panel.   Aortic atherosclerosis (HCC) Assessment & Plan: Continue lipitor.    Dysphagia, unspecified type Assessment & Plan: Pill/food dysphagia as outlined. Some discomfort/fullness. Discussed further w/up and evaluation. Check labs, including metabolic panel, cbc and liver panel. Start protonix. Discussed further testing , including scan.  Also discussed cardiology referral - with discomfort and bradycardia. Wants to hold on further testing and referral.  Trial of above. Follow.  Call with update. Get her back in soon to reassess.    Other orders -     Atorvastatin Calcium; Take 1 tablet (40 mg total) by mouth daily.  Dispense: 90 tablet; Refill: 3 -     Losartan Potassium; Take 1 tablet (100 mg total) by mouth daily.  Dispense: 90 tablet; Refill: 3 -     Triamterene-HCTZ; Take 1/2 tablet by mouth daily.  Dispense: 45 tablet; Refill: 1 -     dilTIAZem HCl ER Coated Beads; Take 1 capsule (240 mg total) by mouth daily.  Dispense: 30 capsule; Refill: 1 -     Pantoprazole Sodium; Take 1 tablet (40 mg total) by mouth daily.  Dispense: 30 tablet; Refill: 1     Dale Fillmore, MD

## 2023-10-15 ENCOUNTER — Encounter: Payer: Self-pay | Admitting: Internal Medicine

## 2023-10-15 DIAGNOSIS — R1310 Dysphagia, unspecified: Secondary | ICD-10-CM | POA: Insufficient documentation

## 2023-10-15 NOTE — Assessment & Plan Note (Signed)
Symptoms as outlined. Heart rate 40s/50s. Decrease cardizem to 240mg . Follow blood pressure and pulse rate. Get her back in soon to reassess.

## 2023-10-15 NOTE — Assessment & Plan Note (Signed)
Blood pressure as outlined. Continue losartan and triam/hctz 37.5/25 - 1/2 tablet q day.  Also on cardizem 360mg  q day. Decrease to 240mg  as outlined. On potassium q day. Follow pressures.  Follow metabolic panel.

## 2023-10-15 NOTE — Assessment & Plan Note (Signed)
Stay hydrated. Avoid antiinflammatories.  Continue losartan.  Follow metabolic panel.  

## 2023-10-15 NOTE — Assessment & Plan Note (Signed)
Continue lipitor.  Low cholesterol diet and exercise.  Follow lipid panel and liver function tests.   

## 2023-10-15 NOTE — Assessment & Plan Note (Signed)
Saw endocrinology - 07/2023 - plan thyroid ultrasound 02/2024.

## 2023-10-15 NOTE — Assessment & Plan Note (Signed)
Continue lipitor  ?

## 2023-10-15 NOTE — Assessment & Plan Note (Signed)
Low carb diet and exercise given elevated blood sugars.  Follow met b and a1c.  Lab Results  Component Value Date   HGBA1C 6.4 10/11/2023

## 2023-10-15 NOTE — Assessment & Plan Note (Signed)
Pill/food dysphagia as outlined. Some discomfort/fullness. Discussed further w/up and evaluation. Check labs, including metabolic panel, cbc and liver panel. Start protonix. Discussed further testing , including scan.  Also discussed cardiology referral - with discomfort and bradycardia. Wants to hold on further testing and referral.  Trial of above. Follow.  Call with update. Get her back in soon to reassess.

## 2023-11-05 ENCOUNTER — Other Ambulatory Visit: Payer: Self-pay | Admitting: Internal Medicine

## 2023-11-09 ENCOUNTER — Encounter: Payer: Self-pay | Admitting: Internal Medicine

## 2023-11-09 ENCOUNTER — Ambulatory Visit (INDEPENDENT_AMBULATORY_CARE_PROVIDER_SITE_OTHER): Payer: Medicare HMO | Admitting: Internal Medicine

## 2023-11-09 VITALS — BP 128/72 | HR 62 | Temp 97.8°F | Resp 16 | Ht 63.0 in | Wt 136.8 lb

## 2023-11-09 DIAGNOSIS — I10 Essential (primary) hypertension: Secondary | ICD-10-CM | POA: Diagnosis not present

## 2023-11-09 DIAGNOSIS — R198 Other specified symptoms and signs involving the digestive system and abdomen: Secondary | ICD-10-CM

## 2023-11-09 DIAGNOSIS — E7800 Pure hypercholesterolemia, unspecified: Secondary | ICD-10-CM | POA: Diagnosis not present

## 2023-11-09 DIAGNOSIS — I700 Atherosclerosis of aorta: Secondary | ICD-10-CM

## 2023-11-09 DIAGNOSIS — N1831 Chronic kidney disease, stage 3a: Secondary | ICD-10-CM | POA: Diagnosis not present

## 2023-11-09 DIAGNOSIS — E1165 Type 2 diabetes mellitus with hyperglycemia: Secondary | ICD-10-CM

## 2023-11-09 MED ORDER — DILTIAZEM HCL ER COATED BEADS 240 MG PO CP24: 240.0000 mg | ORAL_CAPSULE | Freq: Every day | ORAL | 1 refills | Status: DC

## 2023-11-09 MED ORDER — PANTOPRAZOLE SODIUM 40 MG PO TBEC: 40.0000 mg | DELAYED_RELEASE_TABLET | Freq: Every day | ORAL | 1 refills | Status: DC

## 2023-11-09 NOTE — Assessment & Plan Note (Signed)
 Low carb diet and exercise given elevated blood sugars.  Follow met b and a1c.  Lab Results  Component Value Date   HGBA1C 6.4 10/11/2023

## 2023-11-09 NOTE — Assessment & Plan Note (Signed)
 Blood pressure as outlined.  Appears to be doing ok on the 240mg  cardizem. Continues losartan and triam/hydrochlorothiazide 1/2 tablet per day. Follow pressures.  Follow metabolic panel

## 2023-11-09 NOTE — Progress Notes (Signed)
 Subjective:    Patient ID: Christina Cole, female    DOB: February 11, 1941, 83 y.o.   MRN: [id number]  Patient here for  Chief Complaint  Patient presents with   Bradycardia   Blood Pressure    HPI Here for scheduled follow up - f/u regarding diabetes, hypertension and hypercholesterolemia. Saw endocrinology for f/u thyroid  goiter. On synthroid. Recommended f/u ultrasound in 02/2024.  Last visit, heart rate 40-50s. Cardizem  dose decreased to 240mg  q day. Heart rate is doing better. Maintaining adequate blood pressure. No chest pain or sob. No swallowing problems. The previous upper symptoms and chest symptoms have resolved on protonix . No abdominal pain or bowel change.    Past Medical History:  Diagnosis Date   Heart murmur    Hyperlipidemia    Hypertension    Thyroid  disease    goiter (follows with Dr. Christi)   Past Surgical History:  Procedure Laterality Date   ABDOMINAL HYSTERECTOMY  1978   ovaries left in place   APPENDECTOMY  1978   BREAST BIOPSY Right 09/29/2017   Pt had Bx  done in [facility] office [facility] @ 3:00 per chart-FIBROCYSTIC CHANGES.   COLONOSCOPY WITH PROPOFOL  N/A 07/11/2017   Procedure: COLONOSCOPY WITH PROPOFOL ;  Surgeon: Jinny Carmine, MD;  Location: [facility] ENDOSCOPY;  Service: Endoscopy;  Laterality: N/A;   TUBAL LIGATION     Family History  Problem Relation Age of Onset   Stroke Mother    Hypertension Mother    Arthritis Mother    Breast cancer Sister 17   Breast cancer Sister 34   Lung cancer Brother    Diabetes Brother    Social History   Socioeconomic History   Marital status: Single    Spouse name: Not on file   Number of children: 3   Years of education: Not on file   Highest education level: Not on file  Occupational History   Not on file  Tobacco Use   Smoking status: Former   Smokeless tobacco: Never  Vaping Use   Vaping status: Never Used  Substance and Sexual Activity   Alcohol use: No    Alcohol/week: 0.0 standard drinks of  alcohol   Drug use: No   Sexual activity: Not on file  Other Topics Concern   Not on file  Social History Narrative   Twin sister died 04-Dec-2021 and her brother died 12/04/2021    Social Drivers of Health   Financial Resource Strain: Low Risk  (07/24/2023)   Received from [facility]   Overall Financial Resource Strain (CARDIA)    Difficulty of Paying Living Expenses: Not hard at all  Food Insecurity: No Food Insecurity (07/24/2023)   Received from [facility]   Hunger Vital Sign    Worried About Running Out of Food in the Last Year: Never true    Ran Out of Food in the Last Year: Never true  Transportation Needs: No Transportation Needs (07/24/2023)   Received from [facility]   PRAPARE - Transportation    In the past 12 months, has lack of transportation kept you from medical appointments or from getting medications?: No    Lack of Transportation (Non-Medical): No  Physical Activity: Not on file  Stress: No Stress Concern Present (12/30/2022)   [employer] of Occupational Health - Occupational Stress Questionnaire    Feeling of Stress : Not at all  Social Connections: Unknown (12/30/2022)   Social Connection and Isolation Panel [NHANES]  Frequency of Communication with Friends and Family: More than three times a week    Frequency of Social Gatherings with Friends and Family: More than three times a week    Attends Religious Services: More than 4 times per year    Active Member of Clubs or Organizations: Not on file    Attends Engineer, Structural: More than 4 times per year    Marital Status: Not on file     Review of Systems  Constitutional:  Negative for appetite change and unexpected weight change.  HENT:  Negative for congestion and sinus pressure.   Respiratory:  Negative for cough, chest tightness and shortness of breath.   Cardiovascular:  Negative for chest pain, palpitations and leg swelling.   Gastrointestinal:  Negative for abdominal pain, diarrhea, nausea and vomiting.  Genitourinary:  Negative for difficulty urinating and dysuria.  Musculoskeletal:  Negative for joint swelling and myalgias.  Skin:  Negative for color change and rash.  Neurological:  Negative for dizziness and headaches.  Psychiatric/Behavioral:  Negative for agitation and dysphoric mood.        Objective:     BP 128/72   Pulse 62   Temp 97.8 F (36.6 C)   Resp 16   Ht 5' 3 (1.6 m)   Wt 136 lb 12.8 oz (62.1 kg)   LMP 12/21/1976   SpO2 98%   BMI 24.23 kg/m  Wt Readings from Last 3 Encounters:  11/09/23 136 lb 12.8 oz (62.1 kg)  10/13/23 136 lb 9.6 oz (62 kg)  06/13/23 134 lb (60.8 kg)    Physical Exam Vitals reviewed.  Constitutional:      General: She is not in acute distress.    Appearance: Normal appearance.  HENT:     Head: Normocephalic and atraumatic.     Right Ear: External ear normal.     Left Ear: External ear normal.     Mouth/Throat:     Pharynx: No oropharyngeal exudate or posterior oropharyngeal erythema.  Eyes:     General: No scleral icterus.       Right eye: No discharge.        Left eye: No discharge.     Conjunctiva/sclera: Conjunctivae normal.  Neck:     Thyroid : No thyromegaly.  Cardiovascular:     Rate and Rhythm: Normal rate and regular rhythm.  Pulmonary:     Effort: No respiratory distress.     Breath sounds: Normal breath sounds. No wheezing.  Abdominal:     General: Bowel sounds are normal.     Palpations: Abdomen is soft.     Tenderness: There is no abdominal tenderness.  Musculoskeletal:        General: No swelling or tenderness.     Cervical back: Neck supple. No tenderness.  Lymphadenopathy:     Cervical: No cervical adenopathy.  Skin:    Findings: No erythema or rash.  Neurological:     Mental Status: She is alert.  Psychiatric:        Mood and Affect: Mood normal.        Behavior: Behavior normal.      Outpatient Encounter  Medications as of 11/09/2023  Medication Sig   atorvastatin  (LIPITOR) 40 MG tablet Take 1 tablet (40 mg total) by mouth daily.   diltiazem  (CARDIZEM  CD) 240 MG 24 hr capsule Take 1 capsule (240 mg total) by mouth daily.   EPINEPHrine  0.3 mg/0.3 mL IJ SOAJ injection USE AS DIRECTED   levothyroxine (SYNTHROID, LEVOTHROID)  25 MCG tablet Take 25 mcg by mouth daily before breakfast.   losartan  (COZAAR ) 100 MG tablet Take 1 tablet (100 mg total) by mouth daily.   pantoprazole  (PROTONIX ) 40 MG tablet Take 1 tablet (40 mg total) by mouth daily.   triamterene -hydrochlorothiazide  (MAXZIDE-25) 37.5-25 MG tablet Take 1/2 tablet by mouth daily.   [DISCONTINUED] diltiazem  (CARDIZEM  CD) 240 MG 24 hr capsule TAKE 1 CAPSULE BY MOUTH EVERY DAY   [DISCONTINUED] pantoprazole  (PROTONIX ) 40 MG tablet TAKE 1 TABLET BY MOUTH EVERY DAY   No facility-administered encounter medications on file as of 11/09/2023.     Lab Results  Component Value Date   WBC 5.5 10/11/2023   HGB 12.1 10/11/2023   HCT 37.7 10/11/2023   PLT 196.0 10/11/2023   GLUCOSE 96 10/11/2023   CHOL 153 10/11/2023   TRIG 98.0 10/11/2023   HDL 53.60 10/11/2023   LDLCALC 80 10/11/2023   ALT 12 10/11/2023   AST 16 10/11/2023   NA 138 10/11/2023   K 3.4 (L) 10/11/2023   CL 101 10/11/2023   CREATININE 1.06 10/11/2023   BUN 19 10/11/2023   CO2 30 10/11/2023   TSH 2.12 03/17/2023   HGBA1C 6.4 10/11/2023   MICROALBUR <0.7 10/11/2023    MM 3D SCREENING MAMMOGRAM BILATERAL BREAST Result Date: 09/15/2023 CLINICAL DATA:  Screening. EXAM: DIGITAL SCREENING BILATERAL MAMMOGRAM WITH TOMOSYNTHESIS AND CAD TECHNIQUE: Bilateral screening digital craniocaudal and mediolateral oblique mammograms were obtained. Bilateral screening digital breast tomosynthesis was performed. The images were evaluated with computer-aided detection. COMPARISON:  Previous exam(s). ACR Breast Density Category c: The breasts are heterogeneously dense, which may obscure small masses.  FINDINGS: There are no findings suspicious for malignancy. IMPRESSION: No mammographic evidence of malignancy. A result letter of this screening mammogram will be mailed directly to the patient. RECOMMENDATION: Screening mammogram in one year. (Code:SM-B-01Y) BI-RADS CATEGORY  1: Negative. Electronically Signed   By: Rosaline Collet M.D.   On: 09/15/2023 16:32   DG Bone Density Result Date: 09/14/2023 EXAM: DUAL X-RAY ABSORPTIOMETRY (DXA) FOR BONE MINERAL DENSITY IMPRESSION: Dear Dr Glendia, Your patient LAQUINDA MOLLER completed a FRAX assessment on 09/14/2023 using the Lunar iDXA DXA System (analysis version: 14.10) manufactured by [employer]. The following summarizes the results of our evaluation. PATIENT BIOGRAPHICAL: Name: Inez, Stantz Patient ID: [id number] Birth Date: 18-Jan-1941 Height:    63.0 in. Gender:     Female    [age]       Weight:    134.0 lbs. Ethnicity:  Black                            Exam Date: 09/14/2023 FRAX* RESULTS:  (version: 3.5) 10-year Probability of Fracture1 Major Osteoporotic Fracture2 Hip Fracture 6.0% 1.4% Population: [place]  (Black) Risk Factors: None Based on Femur (Left) Neck BMD 1 -The 10-year probability of fracture may be lower than reported if the patient has received treatment. 2 -Major Osteoporotic Fracture: Clinical Spine, Forearm, Hip or Shoulder *FRAX is a armed forces logistics/support/administrative officer of the [employer] of [employer] for Metabolic Bone Disease, a World Science Writer (WHO) [employer]. ASSESSMENT: The probability of a major osteoporotic fracture is 6.0% within the next ten years. The probability of a hip fracture is 1.4% within the next ten years. . Your patient Sela Falk completed a BMD test on 09/14/2023 using the [employer] DXA System (software version: 14.10) manufactured by [employer]  LUNAR. The following summarizes the results of our evaluation. Technologist: MTB PATIENT BIOGRAPHICAL: Name: Betania, Dizon Patient ID: [id number] Birth  Date: 1941/10/22 Height: 63.0 in. Gender: Female Exam Date: 09/14/2023 Weight: 134.0 lbs. Indications: Hysterectomy, Postmenopausal Fractures: Treatments: DENSITOMETRY RESULTS: Site         Region     Measured Date Measured Age WHO Classification Young Adult T-score BMD         %Change vs. Previous Significant Change (*) AP Spine L1-L3 09/14/2023 80.3 Normal -0.1 1.173 g/cm2 - - DualFemur Neck Left 09/14/2023 80.3 Osteopenia -1.5 0.833 g/cm2 - - DualFemur Total Mean 09/14/2023 80.3 Normal -0.7 0.915 g/cm2 - - Left Forearm Radius 33% 09/14/2023 80.3 Normal 0.0 0.878 g/cm2 - - ASSESSMENT: The BMD measured at Femur Neck Left is 0.833 g/cm2 with a T-score of -1.5. This patient's diagnostic category is LOW BONE MASS/OSTEOPENIA according to World Health Organization [facility]) criteria. The scan quality is good. L-4 was excluded due to degenerative changes. World Science Writer [facility]) criteria for post-menopausal, Caucasian Women: Normal:                   T-score at or above -1 SD Osteopenia/low bone mass: T-score between -1 and -2.5 SD Osteoporosis:             T-score at or below -2.5 SD RECOMMENDATIONS: 1. All patients should optimize calcium  and vitamin D intake. 2. Consider FDA-approved medical therapies in postmenopausal women and men aged 33 years and older, based on the following: a. A hip or vertebral(clinical or morphometric) fracture b. T-score < -2.5 at the femoral neck or spine after appropriate evaluation to exclude secondary causes c. Low bone mass (T-score between -1.0 and -2.5 at the femoral neck or spine) and a 10-year probability of a hip fracture > 3% or a 10-year probability of a major osteoporosis-related fracture > 20% based on the [place] -adapted WHO algorithm 3. Clinician judgment and/or patient preferences may indicate treatment for people with 10-year fracture probabilities above or below these levels FOLLOW-UP: People with diagnosed cases of osteoporosis or at high risk for fracture should have  regular bone mineral density tests. For patients eligible for Medicare, routine testing is allowed once every 2 years. The testing frequency can be increased to one year for patients who have rapidly progressing disease, those who are receiving or discontinuing medical therapy to restore bone mass, or have additional risk factors. I have reviewed this report, and agree with the above findings. [facility] Electronically Signed   By: Reyes Phi M.D.   On: 09/14/2023 11:45       Assessment & Plan:  Aortic atherosclerosis (HCC) Assessment & Plan: Continue lipitor.    Stage 3a chronic kidney disease (HCC) Assessment & Plan: Stay hydrated. Avoid antiinflammatories.  Continue losartan .  Follow metabolic panel.   Essential hypertension, benign Assessment & Plan: Blood pressure as outlined.  Appears to be doing ok on the 240mg  cardizem . Continues losartan  and triam/hydrochlorothiazide  1/2 tablet per day. Follow pressures.  Follow metabolic panel    Hypercholesterolemia Assessment & Plan: Continue lipitor.  Low cholesterol diet and exercise.  Follow lipid panel and liver function tests.    Type 2 diabetes mellitus with hyperglycemia, without long-term current use of insulin (HCC) Assessment & Plan: Low carb diet and exercise given elevated blood sugars.  Follow met b and a1c.  Lab Results  Component Value Date   HGBA1C 6.4 10/11/2023     Abdominal fullness Assessment & Plan: Symptoms resolved.  Continue protonix .    Other orders -     Pantoprazole  Sodium; Take 1 tablet (40 mg total) by mouth daily.  Dispense: 90 tablet; Refill: 1 -     dilTIAZem  HCl ER Coated Beads; Take 1 capsule (240 mg total) by mouth daily.  Dispense: 90 capsule; Refill: 1     Allena Hamilton, MD

## 2023-11-09 NOTE — Assessment & Plan Note (Signed)
 Symptoms resolved.  Continue protonix.

## 2023-11-09 NOTE — Assessment & Plan Note (Signed)
 Continue lipitor  ?

## 2023-11-09 NOTE — Assessment & Plan Note (Signed)
Stay hydrated. Avoid antiinflammatories.  Continue losartan.  Follow metabolic panel.  

## 2023-11-09 NOTE — Assessment & Plan Note (Signed)
 Continue lipitor.  Low cholesterol diet and exercise.  Follow lipid panel and liver function tests.

## 2023-11-14 ENCOUNTER — Ambulatory Visit: Payer: Self-pay | Admitting: Internal Medicine

## 2023-11-14 NOTE — Telephone Encounter (Signed)
 FYI see report. I have called patient to check on her. She is doing ok. Hives have resolved. She says she has has this before and was referred to allergy specialist but lost 2 family members unexpectedly so was unable to go. She has follow up in 12 weeks. She says she will discuss referral with you again at her appt and nothing further was needed at this time. She will be evaluated if any acute issues.

## 2023-11-14 NOTE — Telephone Encounter (Signed)
 This RN attempted to contact patient regarding medical question, no answer, left voicemail requesting call back to clinic.  Copied from CRM [id number]. Topic: Clinical - Pink Word Triage >> Nov 14, 2023 10:57 AM Burnard DEL wrote: Reason for Triage: break out of hives last night,she would ike to know if it is okay for her to take benadryl 

## 2023-11-14 NOTE — Telephone Encounter (Signed)
  Chief Complaint: hives Symptoms: hives to groin, thighs; itching; nausea(resolved) Frequency: since midnight Pertinent Negatives: Patient denies SOB, fever, facial swelling. Disposition: [] ED /[] Urgent Care (no appt availability in office) / [] Appointment(In office/virtual)/ []  [facility]/ [x] Home Care/ [] Refused Recommended Disposition /[] [facility]/ []  Follow-up with PCP Additional Notes: Patient denies exposure to any known irritants/allergens. Patient staes she felt nauseated and drank a ginger ale last night and then felt itchy and noticed hives appear near thighs and groin. Patient states she applied ice last night which improved the hives. Patient states she wanted to wait to take Benadryl  until she spoke with RN. Patient states she also has an epi pen at home and verbalizes understanding of signs of anaphylaxis and when to use epi pen and call 911. Patient verbalizes understanding of home care advice and to call back if she does not see improvement on antihistamine or symptoms worsen. Reason for Disposition  Localized hives  Answer Assessment - Initial Assessment Questions 1. APPEARANCE: What does the rash look like?      Blister-type; starts off itching with a bump and then spreads/opens up and becomes puffy.  2. LOCATION: Where is the rash located?      Right outer thigh, left thigh, groin  3. NUMBER: How many hives are there?      At least 5-6.  4. SIZE: How big are the hives? (inches, cm, compare to coins) Do they all look the same or is there lots of variation in shape and size?      Variation in size; a couple are the size of a nickel.  5. ONSET: When did the hives begin? (Hours or days ago)      Last night around midnight.  6. ITCHING: Does it itch? If Yes, ask: How bad is the itch?    - MILD: doesn't interfere with normal activities   - MODERATE-SEVERE: interferes with work, school, sleep, or other activities      Moderate. She  states the itching is worse before the hives appear.  7. RECURRENT PROBLEM: Have you had hives before? If Yes, ask: When was the last time? and What happened that time?      Patient states she had in 2023 hives, she saw a dermatologist and was placed on Zantac for about a month. She thinks she had another attack in 2024.  8. TRIGGERS: Were you exposed to any new food, plant, cosmetic product or animal just before the hives began?     Patient denies any new food, plants, animals, or body products.  9. OTHER SYMPTOMS: Do you have any other symptoms? (e.g., fever, tongue swelling, difficulty breathing, abdomen pain)     Last night felt nauseated prior to onset.  Protocols used: Hives-A-AH

## 2023-11-15 NOTE — Telephone Encounter (Signed)
 Please call and f/u with her. Confirm no further nausea.  I can place another order for referral to allergist - if she wants me to do this prior to her next appt with me. Let me know if any problems.

## 2023-11-15 NOTE — Telephone Encounter (Signed)
 LMTCB

## 2023-11-16 ENCOUNTER — Telehealth: Payer: Self-pay | Admitting: Internal Medicine

## 2023-11-16 NOTE — Telephone Encounter (Signed)
Copied from CRM [id number]. Topic: General - Call Back - No Documentation >> Nov 16, 2023  9:46 AM Christina Cole wrote: Reason for CRM: Patient called in Returning tisha call / please call [phone]

## 2023-11-16 NOTE — Telephone Encounter (Signed)
 See other note

## 2023-11-16 NOTE — Telephone Encounter (Signed)
No further nausea. No further issues. Will let me know if she decides she wants a referral.

## 2024-01-30 ENCOUNTER — Other Ambulatory Visit (INDEPENDENT_AMBULATORY_CARE_PROVIDER_SITE_OTHER): Payer: Medicare HMO

## 2024-01-30 DIAGNOSIS — E7800 Pure hypercholesterolemia, unspecified: Secondary | ICD-10-CM | POA: Diagnosis not present

## 2024-01-30 DIAGNOSIS — E1165 Type 2 diabetes mellitus with hyperglycemia: Secondary | ICD-10-CM | POA: Diagnosis not present

## 2024-01-30 LAB — BASIC METABOLIC PANEL WITH GFR
BUN: 14 mg/dL (ref 6–23)
CO2: 28 meq/L (ref 19–32)
Calcium: 9.6 mg/dL (ref 8.4–10.5)
Chloride: 102 meq/L (ref 96–112)
Creatinine, Ser: 0.96 mg/dL (ref 0.40–1.20)
GFR: 55.8 mL/min — ABNORMAL LOW (ref >60.00)
Glucose, Bld: 93 mg/dL (ref 70–99)
Potassium: 3.7 meq/L (ref 3.5–5.1)
Sodium: 139 meq/L (ref 135–145)

## 2024-01-30 LAB — HEPATIC FUNCTION PANEL
ALT: 10 U/L (ref 0–35)
AST: 12 U/L (ref 0–37)
Albumin: 4.3 g/dL (ref 3.5–5.2)
Alkaline Phosphatase: 54 U/L (ref 39–117)
Bilirubin, Direct: 0.1 mg/dL (ref 0.0–0.3)
Total Bilirubin: 0.8 mg/dL (ref 0.2–1.2)
Total Protein: 7.4 g/dL (ref 6.0–8.3)

## 2024-01-30 LAB — LIPID PANEL
Cholesterol: 162 mg/dL (ref 0–200)
HDL: 57.2 mg/dL (ref >39.00)
LDL Cholesterol: 88 mg/dL (ref 0–99)
NonHDL: 105.16
Total CHOL/HDL Ratio: 3
Triglycerides: 87 mg/dL (ref 0.0–149.0)
VLDL: 17.4 mg/dL (ref 0.0–40.0)

## 2024-01-30 LAB — HEMOGLOBIN A1C: Hgb A1c MFr Bld: 6.5 % (ref 4.6–6.5)

## 2024-01-31 ENCOUNTER — Other Ambulatory Visit: Payer: Self-pay | Admitting: Internal Medicine

## 2024-01-31 MED ORDER — DILTIAZEM HCL ER COATED BEADS 240 MG PO CP24: 240.0000 mg | ORAL_CAPSULE | Freq: Every day | ORAL | 1 refills | Status: DC

## 2024-01-31 NOTE — Telephone Encounter (Signed)
 Copied from CRM [id number]. Topic: Clinical - Medication Refill >> Jan 31, 2024  9:05 AM Truddie Crumble wrote: Most Recent Primary Care Visit:  Provider: [facility] [place]  Department: [facility]  Visit Type: OFFICE VISIT  Date: 11/09/2023  Medication: diltiazem (CARDIZEM CD) 240 MG 24 hr capsule  Has the patient contacted their pharmacy? No (Agent: If no, request that the patient contact the pharmacy for the refill. If patient does not wish to contact the pharmacy document the reason why and proceed with request.) (Agent: If yes, when and what did the pharmacy advise?)  Is this the correct pharmacy for this prescription? Yes If no, delete pharmacy and type the correct one.  This is the patient's preferred pharmacy:   [facility] - [place], [place] - [address] [place] [address] Phone: [phone] Fax: [phone]   Has the prescription been filled recently? No  Is the patient out of the medication? No  Has the patient been seen for an appointment in the last year OR does the patient have an upcoming appointment? Yes  Can we respond through MyChart? No  Agent: Please be advised that Rx refills may take up to 3 business days. We ask that you follow-up with your pharmacy.

## 2024-02-01 ENCOUNTER — Ambulatory Visit (INDEPENDENT_AMBULATORY_CARE_PROVIDER_SITE_OTHER): Payer: Medicare HMO | Admitting: Internal Medicine

## 2024-02-01 VITALS — BP 124/76 | HR 74 | Temp 98.0°F | Resp 16 | Ht 64.0 in | Wt 137.0 lb

## 2024-02-01 DIAGNOSIS — I700 Atherosclerosis of aorta: Secondary | ICD-10-CM | POA: Diagnosis not present

## 2024-02-01 DIAGNOSIS — E1165 Type 2 diabetes mellitus with hyperglycemia: Secondary | ICD-10-CM

## 2024-02-01 DIAGNOSIS — N1831 Chronic kidney disease, stage 3a: Secondary | ICD-10-CM | POA: Diagnosis not present

## 2024-02-01 DIAGNOSIS — I10 Essential (primary) hypertension: Secondary | ICD-10-CM | POA: Diagnosis not present

## 2024-02-01 DIAGNOSIS — E876 Hypokalemia: Secondary | ICD-10-CM

## 2024-02-01 DIAGNOSIS — E079 Disorder of thyroid, unspecified: Secondary | ICD-10-CM

## 2024-02-01 DIAGNOSIS — E7800 Pure hypercholesterolemia, unspecified: Secondary | ICD-10-CM

## 2024-02-01 MED ORDER — DILTIAZEM HCL ER COATED BEADS 240 MG PO CP24: 240.0000 mg | ORAL_CAPSULE | Freq: Every day | ORAL | 3 refills | Status: AC

## 2024-02-01 MED ORDER — PANTOPRAZOLE SODIUM 40 MG PO TBEC: 40.0000 mg | DELAYED_RELEASE_TABLET | Freq: Every day | ORAL | 1 refills | Status: DC

## 2024-02-01 MED ORDER — TRIAMTERENE-HCTZ 37.5-25 MG PO TABS: ORAL_TABLET | ORAL | 1 refills | Status: DC

## 2024-02-01 NOTE — Progress Notes (Signed)
 Subjective:    Patient ID: Christina Cole, female    DOB: 1941-08-22, 83 y.o.   MRN: [id number]  Patient here for  Chief Complaint  Patient presents with   Medical Management of Chronic Issues    HPI Here for scheduled follow up - f/u regarding diabetes, hypertension and hypercholesterolemia. Saw endocrinology for f/u thyroid goiter. On synthroid. Recommended f/u ultrasound in 02/2024. She is scheduled to follow up with endocrinology - [place]. Increased stress recently - daughter diagnosed with cancer. Planning to receive XRT. Has good support. Will notify me if feels needs any further intervention. Breathing stable. No abdominal pain or bowel issues.    Past Medical History:  Diagnosis Date   Heart murmur    Hyperlipidemia    Hypertension    Thyroid disease    goiter (follows with Dr. Patrecia Pace)   Past Surgical History:  Procedure Laterality Date   ABDOMINAL HYSTERECTOMY  1978   ovaries left in place   APPENDECTOMY  1978   BREAST BIOPSY Right 09/29/2017   Pt had Bx  done in [facility] office [facility] @ 3:00 per chart-"FIBROCYSTIC CHANGES".   COLONOSCOPY WITH PROPOFOL N/A 07/11/2017   Procedure: COLONOSCOPY WITH PROPOFOL;  Surgeon: Midge Minium, MD;  Location: [facility] ENDOSCOPY;  Service: Endoscopy;  Laterality: N/A;   TUBAL LIGATION     Family History  Problem Relation Age of Onset   Stroke Mother    Hypertension Mother    Arthritis Mother    Breast cancer Sister 42   Breast cancer Sister 51   Lung cancer Brother    Diabetes Brother    Social History   Socioeconomic History   Marital status: Single    Spouse name: Not on file   Number of children: 3   Years of education: Not on file   Highest education level: Not on file  Occupational History   Not on file  Tobacco Use   Smoking status: Former   Smokeless tobacco: Never  Vaping Use   Vaping status: Never Used  Substance and Sexual Activity   Alcohol use: No    Alcohol/week: 0.0 standard drinks of alcohol    Drug use: No   Sexual activity: Not on file  Other Topics Concern   Not on file  Social History Narrative   Twin sister died 12-04-2021 and her brother died Dec 04, 2021    Social Drivers of Health   Financial Resource Strain: Low Risk  (07/24/2023)   Received from [facility]   Overall Financial Resource Strain (CARDIA)    Difficulty of Paying Living Expenses: Not hard at all  Food Insecurity: No Food Insecurity (07/24/2023)   Received from [facility]   Hunger Vital Sign    Worried About Running Out of Food in the Last Year: Never true    Ran Out of Food in the Last Year: Never true  Transportation Needs: No Transportation Needs (07/24/2023)   Received from [facility]   PRAPARE - Transportation    In the past 12 months, has lack of transportation kept you from medical appointments or from getting medications?: No    Lack of Transportation (Non-Medical): No  Physical Activity: Not on file  Stress: No Stress Concern Present (12/30/2022)   [employer] of Occupational Health - Occupational Stress Questionnaire    Feeling of Stress : Not at all  Social Connections: Unknown (12/30/2022)   Social Connection and Isolation Panel [NHANES]    Frequency of Communication with Friends  and Family: More than three times a week    Frequency of Social Gatherings with Friends and Family: More than three times a week    Attends Religious Services: More than 4 times per year    Active Member of Clubs or Organizations: Not on file    Attends Engineer, structural: More than 4 times per year    Marital Status: Not on file     Review of Systems  Constitutional:  Negative for appetite change and unexpected weight change.  HENT:  Negative for congestion and sinus pressure.   Respiratory:  Negative for cough, chest tightness and shortness of breath.   Cardiovascular:  Negative for chest pain, palpitations and leg swelling.  Gastrointestinal:   Negative for abdominal pain, diarrhea, nausea and vomiting.  Genitourinary:  Negative for difficulty urinating and dysuria.  Musculoskeletal:  Negative for joint swelling and myalgias.  Skin:  Negative for color change and rash.  Neurological:  Negative for dizziness and headaches.  Psychiatric/Behavioral:  Negative for agitation and dysphoric mood.        Increased stress as outlined.        Objective:     BP 124/76   Pulse 74   Temp 98 F (36.7 C)   Resp 16   Ht 5\' 4"  (1.626 m)   Wt 137 lb (62.1 kg)   LMP 12/21/1976   SpO2 97%   BMI 23.52 kg/m  Wt Readings from Last 3 Encounters:  02/01/24 137 lb (62.1 kg)  11/09/23 136 lb 12.8 oz (62.1 kg)  10/13/23 136 lb 9.6 oz (62 kg)    Physical Exam Vitals reviewed.  Constitutional:      General: She is not in acute distress.    Appearance: Normal appearance.  HENT:     Head: Normocephalic and atraumatic.     Right Ear: External ear normal.     Left Ear: External ear normal.     Mouth/Throat:     Pharynx: No oropharyngeal exudate or posterior oropharyngeal erythema.  Eyes:     General: No scleral icterus.       Right eye: No discharge.        Left eye: No discharge.     Conjunctiva/sclera: Conjunctivae normal.  Neck:     Thyroid: No thyromegaly.  Cardiovascular:     Rate and Rhythm: Normal rate and regular rhythm.  Pulmonary:     Effort: No respiratory distress.     Breath sounds: Normal breath sounds. No wheezing.  Abdominal:     General: Bowel sounds are normal.     Palpations: Abdomen is soft.     Tenderness: There is no abdominal tenderness.  Musculoskeletal:        General: No swelling or tenderness.     Cervical back: Neck supple. No tenderness.  Lymphadenopathy:     Cervical: No cervical adenopathy.  Skin:    Findings: No erythema or rash.  Neurological:     Mental Status: She is alert.  Psychiatric:        Mood and Affect: Mood normal.        Behavior: Behavior normal.         Outpatient  Encounter Medications as of 02/01/2024  Medication Sig   atorvastatin (LIPITOR) 40 MG tablet Take 1 tablet (40 mg total) by mouth daily.   diltiazem (CARDIZEM CD) 240 MG 24 hr capsule Take 1 capsule (240 mg total) by mouth daily.   EPINEPHrine 0.3 mg/0.3 mL IJ SOAJ injection USE  AS DIRECTED   levothyroxine (SYNTHROID, LEVOTHROID) 25 MCG tablet Take 25 mcg by mouth daily before breakfast.   losartan (COZAAR) 100 MG tablet Take 1 tablet (100 mg total) by mouth daily.   pantoprazole (PROTONIX) 40 MG tablet Take 1 tablet (40 mg total) by mouth daily.   triamterene-hydrochlorothiazide (MAXZIDE-25) 37.5-25 MG tablet Take 1/2 tablet by mouth daily.   [DISCONTINUED] diltiazem (CARDIZEM CD) 240 MG 24 hr capsule Take 1 capsule (240 mg total) by mouth daily.   [DISCONTINUED] pantoprazole (PROTONIX) 40 MG tablet Take 1 tablet (40 mg total) by mouth daily.   [DISCONTINUED] triamterene-hydrochlorothiazide (MAXZIDE-25) 37.5-25 MG tablet Take 1/2 tablet by mouth daily.   No facility-administered encounter medications on file as of 02/01/2024.     Lab Results  Component Value Date   WBC 5.5 10/11/2023   HGB 12.1 10/11/2023   HCT 37.7 10/11/2023   PLT 196.0 10/11/2023   GLUCOSE 93 01/30/2024   CHOL 162 01/30/2024   TRIG 87.0 01/30/2024   HDL 57.20 01/30/2024   LDLCALC 88 01/30/2024   ALT 10 01/30/2024   AST 12 01/30/2024   NA 139 01/30/2024   K 3.7 01/30/2024   CL 102 01/30/2024   CREATININE 0.96 01/30/2024   BUN 14 01/30/2024   CO2 28 01/30/2024   TSH 2.12 03/17/2023   HGBA1C 6.5 01/30/2024   MICROALBUR <0.7 10/11/2023    MM 3D SCREENING MAMMOGRAM BILATERAL BREAST Result Date: 09/15/2023 CLINICAL DATA:  Screening. EXAM: DIGITAL SCREENING BILATERAL MAMMOGRAM WITH TOMOSYNTHESIS AND CAD TECHNIQUE: Bilateral screening digital craniocaudal and mediolateral oblique mammograms were obtained. Bilateral screening digital breast tomosynthesis was performed. The images were evaluated with computer-aided  detection. COMPARISON:  Previous exam(s). ACR Breast Density Category c: The breasts are heterogeneously dense, which may obscure small masses. FINDINGS: There are no findings suspicious for malignancy. IMPRESSION: No mammographic evidence of malignancy. A result letter of this screening mammogram will be mailed directly to the patient. RECOMMENDATION: Screening mammogram in one year. (Code:SM-B-01Y) BI-RADS CATEGORY  1: Negative. Electronically Signed   By: Frederico Hamman M.D.   On: 09/15/2023 16:32   DG Bone Density Result Date: 09/14/2023 EXAM: DUAL X-RAY ABSORPTIOMETRY (DXA) FOR BONE MINERAL DENSITY IMPRESSION: Dear Dr Lorin Picket, Your patient JOHNETTA SLONIKER completed a FRAX assessment on 09/14/2023 using the Lunar iDXA DXA System (analysis version: 14.10) manufactured by [employer]. The following summarizes the results of our evaluation. PATIENT BIOGRAPHICAL: Name: Lujuana, Kapler Patient ID: [id number] Birth Date: 27-Apr-1941 Height:    63.0 in. Gender:     Female    [age]       Weight:    134.0 lbs. Ethnicity:  Black                            Exam Date: 09/14/2023 FRAX* RESULTS:  (version: 3.5) 10-year Probability of Fracture1 Major Osteoporotic Fracture2 Hip Fracture 6.0% 1.4% Population: [place] (Black) Risk Factors: None Based on Femur (Left) Neck BMD 1 -The 10-year probability of fracture may be lower than reported if the patient has received treatment. 2 -Major Osteoporotic Fracture: Clinical Spine, Forearm, Hip or Shoulder *FRAX is a Armed forces logistics/support/administrative officer of the [employer] of [employer] for Metabolic Bone Disease, a World Science writer (WHO) [employer]. ASSESSMENT: The probability of a major osteoporotic fracture is 6.0% within the next ten years. The probability of a hip fracture is 1.4% within the next ten years. Marland Kitchen  Your patient Kaula Klenke completed a BMD test on 09/14/2023 using the [employer] DXA System (software version: 14.10) manufactured by [employer].  The following summarizes the results of our evaluation. Technologist: MTB PATIENT BIOGRAPHICAL: Name: Toyia, Jelinek Patient ID: [id number] Birth Date: November 04, 1940 Height: 63.0 in. Gender: Female Exam Date: 09/14/2023 Weight: 134.0 lbs. Indications: Hysterectomy, Postmenopausal Fractures: Treatments: DENSITOMETRY RESULTS: Site         Region     Measured Date Measured Age WHO Classification Young Adult T-score BMD         %Change vs. Previous Significant Change (*) AP Spine L1-L3 09/14/2023 80.3 Normal -0.1 1.173 g/cm2 - - DualFemur Neck Left 09/14/2023 80.3 Osteopenia -1.5 0.833 g/cm2 - - DualFemur Total Mean 09/14/2023 80.3 Normal -0.7 0.915 g/cm2 - - Left Forearm Radius 33% 09/14/2023 80.3 Normal 0.0 0.878 g/cm2 - - ASSESSMENT: The BMD measured at Femur Neck Left is 0.833 g/cm2 with a T-score of -1.5. This patient's diagnostic category is LOW BONE MASS/OSTEOPENIA according to World Health Organization [facility]) criteria. The scan quality is good. L-4 was excluded due to degenerative changes. World Science writer [facility]) criteria for post-menopausal, Caucasian Women: Normal:                   T-score at or above -1 SD Osteopenia/low bone mass: T-score between -1 and -2.5 SD Osteoporosis:             T-score at or below -2.5 SD RECOMMENDATIONS: 1. All patients should optimize calcium and vitamin D intake. 2. Consider FDA-approved medical therapies in postmenopausal women and men aged 77 years and older, based on the following: a. A hip or vertebral(clinical or morphometric) fracture b. T-score < -2.5 at the femoral neck or spine after appropriate evaluation to exclude secondary causes c. Low bone mass (T-score between -1.0 and -2.5 at the femoral neck or spine) and a 10-year probability of a hip fracture > 3% or a 10-year probability of a major osteoporosis-related fracture > 20% based on the [place]-adapted WHO algorithm 3. Clinician judgment and/or patient preferences may indicate treatment for people with 10-year fracture  probabilities above or below these levels FOLLOW-UP: People with diagnosed cases of osteoporosis or at high risk for fracture should have regular bone mineral density tests. For patients eligible for Medicare, routine testing is allowed once every 2 years. The testing frequency can be increased to one year for patients who have rapidly progressing disease, those who are receiving or discontinuing medical therapy to restore bone mass, or have additional risk factors. I have reviewed this report, and agree with the above findings. [facility] Electronically Signed   By: Harmon Pier M.D.   On: 09/14/2023 11:45       Assessment & Plan:  Thyroid disease Assessment & Plan: Saw endocrinology - 07/2023 - plan thyroid ultrasound 02/2024.    Hypercholesterolemia Assessment & Plan: Continue lipitor.  Low cholesterol diet and exercise.  Follow lipid panel and liver function tests.  Lab Results  Component Value Date   CHOL 162 01/30/2024   HDL 57.20 01/30/2024   LDLCALC 88 01/30/2024   TRIG 87.0 01/30/2024   CHOLHDL 3 01/30/2024     Orders: -     Lipid panel; Future -     Hepatic function panel; Future -     Basic metabolic panel with GFR; Future  Type 2 diabetes mellitus with hyperglycemia, without long-term current use of insulin (HCC) Assessment & Plan: Low carb diet and exercise  given elevated blood sugars.  Follow met b and a1c.  Lab Results  Component Value Date   HGBA1C 6.5 01/30/2024    Orders: -     Hemoglobin A1c; Future  Essential hypertension, benign Assessment & Plan: Blood pressure as outlined.  Appears to be doing ok on the 240mg  cardizem. Continues losartan and triam/hydrochlorothiazide 1/2 tablet per day. Follow pressures.  Follow metabolic panel. No changes in medication today.    Stage 3a chronic kidney disease (HCC) Assessment & Plan: Stay hydrated. Avoid antiinflammatories.  Continue losartan.  Follow metabolic panel. Recent GFR 55.    Aortic  atherosclerosis (HCC) Assessment & Plan: Continue lipitor.    Hypokalemia Assessment & Plan: On triam/hydrochlorothiazide now. Potassium wnl. On no potassium supplements. Follow.    Other orders -     Pantoprazole Sodium; Take 1 tablet (40 mg total) by mouth daily.  Dispense: 90 tablet; Refill: 1 -     Triamterene-HCTZ; Take 1/2 tablet by mouth daily.  Dispense: 45 tablet; Refill: 1 -     dilTIAZem HCl ER Coated Beads; Take 1 capsule (240 mg total) by mouth daily.  Dispense: 90 capsule; Refill: 3     [place], MD

## 2024-02-01 NOTE — Assessment & Plan Note (Signed)
 Stay hydrated. Avoid antiinflammatories.  Continue losartan.  Follow metabolic panel. Recent GFR 55.

## 2024-02-04 ENCOUNTER — Encounter: Payer: Self-pay | Admitting: Internal Medicine

## 2024-02-04 NOTE — Assessment & Plan Note (Signed)
 Continue lipitor.  Low cholesterol diet and exercise.  Follow lipid panel and liver function tests.  Lab Results  Component Value Date   CHOL 162 01/30/2024   HDL 57.20 01/30/2024   LDLCALC 88 01/30/2024   TRIG 87.0 01/30/2024   CHOLHDL 3 01/30/2024

## 2024-02-04 NOTE — Assessment & Plan Note (Signed)
 On triam/hydrochlorothiazide now. Potassium wnl. On no potassium supplements. Follow.

## 2024-02-04 NOTE — Assessment & Plan Note (Signed)
 Saw endocrinology - 07/2023 - plan thyroid ultrasound 02/2024.

## 2024-02-04 NOTE — Assessment & Plan Note (Signed)
 Blood pressure as outlined.  Appears to be doing ok on the 240mg  cardizem. Continues losartan and triam/hydrochlorothiazide 1/2 tablet per day. Follow pressures.  Follow metabolic panel. No changes in medication today.

## 2024-02-04 NOTE — Assessment & Plan Note (Signed)
 Low carb diet and exercise given elevated blood sugars.  Follow met b and a1c.  Lab Results  Component Value Date   HGBA1C 6.5 01/30/2024

## 2024-02-04 NOTE — Assessment & Plan Note (Signed)
 Continue lipitor  ?

## 2024-02-28 ENCOUNTER — Telehealth: Payer: Self-pay

## 2024-02-28 NOTE — Telephone Encounter (Signed)
 Copied from CRM [id number]. Topic: Clinical - Medical Advice >> Feb 28, 2024  2:24 PM Juleen Oakland F wrote: Reason for CRM: Patient requested call back from Trish at [phone] regarding right heel pain, wants to know if she should be seen or referred to specialist? Patient refused to speak to nurse triage

## 2024-02-29 NOTE — Telephone Encounter (Signed)
 PT SCHEDULED WITH DR SCOTT/

## 2024-03-04 ENCOUNTER — Encounter: Payer: Self-pay | Admitting: Internal Medicine

## 2024-03-04 ENCOUNTER — Ambulatory Visit (INDEPENDENT_AMBULATORY_CARE_PROVIDER_SITE_OTHER)

## 2024-03-04 ENCOUNTER — Ambulatory Visit (INDEPENDENT_AMBULATORY_CARE_PROVIDER_SITE_OTHER): Admitting: Internal Medicine

## 2024-03-04 VITALS — BP 128/70 | HR 80 | Temp 98.0°F | Resp 16 | Ht 64.0 in | Wt 136.4 lb

## 2024-03-04 DIAGNOSIS — M79671 Pain in right foot: Secondary | ICD-10-CM

## 2024-03-04 DIAGNOSIS — N1831 Chronic kidney disease, stage 3a: Secondary | ICD-10-CM | POA: Diagnosis not present

## 2024-03-04 DIAGNOSIS — M2011 Hallux valgus (acquired), right foot: Secondary | ICD-10-CM | POA: Diagnosis not present

## 2024-03-04 DIAGNOSIS — M7661 Achilles tendinitis, right leg: Secondary | ICD-10-CM | POA: Diagnosis not present

## 2024-03-04 DIAGNOSIS — E1165 Type 2 diabetes mellitus with hyperglycemia: Secondary | ICD-10-CM

## 2024-03-04 DIAGNOSIS — M19071 Primary osteoarthritis, right ankle and foot: Secondary | ICD-10-CM | POA: Diagnosis not present

## 2024-03-04 NOTE — Assessment & Plan Note (Signed)
 Stay hydrated. Avoid antiinflammatories.  Continue losartan.  Follow metabolic panel. Recent GFR 55.

## 2024-03-04 NOTE — Progress Notes (Signed)
 Subjective:    Patient ID: Christina Cole, female    DOB: February 24, 1941, 83 y.o.   MRN: [id number]  Patient here for  Chief Complaint  Patient presents with   Foot Pain    Right heel pain    HPI Here for work in appt - work in for right foot pain. She reports that she first noticed issues - posterior heel and achilles region - in 12/2023. Noticed redness - lasted approximately 5 minutes. Since then, she has noticed intermittent pain. Pain appears to be mostly located mid to posterior - plantar surface of her foot and heel. Increased pain to palpation. No pain over the achilles. Appears to have worsened last week and worse yesterday. Was up on her feet more yesterday.  Reports symptoms are worse in the morning - after sleeping. Once starts walking - pain improves.  Also worse if has been sitting for a long period of time - when first stands up. No known injury or trauma. Does not go barefooted.    Past Medical History:  Diagnosis Date   Heart murmur    Hyperlipidemia    Hypertension    Thyroid  disease    goiter (follows with Dr. Charlies Contes)   Past Surgical History:  Procedure Laterality Date   ABDOMINAL HYSTERECTOMY  1978   ovaries left in place   APPENDECTOMY  1978   BREAST BIOPSY Right 09/29/2017   Pt had Bx  done in [facility] office [facility] @ 3:00 per chart-"FIBROCYSTIC CHANGES".   COLONOSCOPY WITH PROPOFOL  N/A 07/11/2017   Procedure: COLONOSCOPY WITH PROPOFOL ;  Surgeon: Marnee Sink, MD;  Location: [facility] END[facility]COPY;  Service: Endoscopy;  Laterality: N/A;   TUBAL LIGATION     Family History  Problem Relation Age of Onset   Stroke Mother    Hypertension Mother    Arthritis Mother    Breast cancer Sister 49   Breast cancer Sister 70   Lung cancer Brother    Diabetes Brother    Social History   Socioeconomic History   Marital status: Single    Spouse name: Not on file   Number of children: 3   Years of education: Not on file   Highest education level: Not on file   Occupational History   Not on file  Tobacco Use   Smoking status: Former   Smokeless tobacco: Never  Vaping Use   Vaping status: Never Used  Substance and Sexual Activity   Alcohol use: No    Alcohol/week: 0.0 standard drinks of alcohol   Drug use: No   Sexual activity: Not on file  Other Topics Concern   Not on file  Social History Narrative   Twin sister died 2021/11/22 and her brother died 2021/11/22    Social Drivers of Health   Financial Resource Strain: Low Risk  (07/24/2023)   [facility]   Overall Financial Resource Strain (CARDIA)    Difficulty of Paying Living Expenses: Not hard at all  Food Insecurity: No Food Insecurity (07/24/2023)   [facility]   Hunger Vital Sign    Worried About Running Out of Food in the Last Year: Never true    Ran Out of Food in the Last Year: Never true  Transportation Needs: No Transportation Needs (07/24/2023)   Received from [facility] - Transportation    In the past 12 months, has lack of transportation kept you from medical appointments or from getting medications?:  No    Lack of Transportation (Non-Medical): No  Physical Activity: Not on file  Stress: No Stress Concern Present (12/30/2022)   [employer] of Occupational Health - Occupational Stress Questionnaire    Feeling of Stress : Not at all  Social Connections: Unknown (12/30/2022)   Social Connection and Isolation Panel [NHANES]    Frequency of Communication with Friends and Family: More than three times a week    Frequency of Social Gatherings with Friends and Family: More than three times a week    Attends Religious Services: More than 4 times per year    Active Member of [employer] or Organizations: Not on file    Attends Engineer, structural: More than 4 times per year    Marital Status: Not on file     Review of Systems  Constitutional:  Negative for appetite change and  unexpected weight change.  HENT:  Negative for congestion and sinus pressure.   Respiratory:  Negative for cough, chest tightness and shortness of breath.   Cardiovascular:  Negative for chest pain, palpitations and leg swelling.  Gastrointestinal:  Negative for abdominal pain, diarrhea, nausea and vomiting.  Genitourinary:  Negative for difficulty urinating and dysuria.  Musculoskeletal:  Negative for joint swelling and myalgias.       Right foot/heel pain as outlined   Skin:  Negative for color change and rash.  Neurological:  Negative for dizziness and headaches.  Psychiatric/Behavioral:  Negative for agitation and dysphoric mood.        Objective:     BP 128/70   Pulse 80   Temp 98 F (36.7 C)   Resp 16   Ht 5\' 4"  (1.626 m)   Wt 136 lb 6.4 oz (61.9 kg)   LMP 12/21/1976   SpO2 98%   BMI 23.41 kg/m  Wt Readings from Last 3 Encounters:  03/04/24 136 lb 6.4 oz (61.9 kg)  02/01/24 137 lb (62.1 kg)  11/09/23 136 lb 12.8 oz (62.1 kg)    Physical Exam Vitals reviewed.  Constitutional:      General: She is not in acute distress.    Appearance: Normal appearance.  HENT:     Head: Normocephalic and atraumatic.     Right Ear: External ear normal.     Left Ear: External ear normal.  Eyes:     General: No scleral icterus.       Right eye: No discharge.        Left eye: No discharge.     Conjunctiva/sclera: Conjunctivae normal.  Neck:     Thyroid : No thyromegaly.  Cardiovascular:     Rate and Rhythm: Normal rate and regular rhythm.  Pulmonary:     Effort: No respiratory distress.     Breath sounds: Normal breath sounds. No wheezing.  Abdominal:     General: Bowel sounds are normal.     Palpations: Abdomen is soft.     Tenderness: There is no abdominal tenderness.  Musculoskeletal:        General: No swelling.     Cervical back: Neck supple. No tenderness.     Comments: Increased tenderness to palpation - plantar surface of right foot - extending to heel. No pain to  palpation over the achilles. No pain to palpation over the ankle. No pain with ankle movement.   Lymphadenopathy:     Cervical: No cervical adenopathy.  Skin:    Findings: No erythema or rash.  Neurological:     Mental Status: She is  alert.  Psychiatric:        Mood and Affect: Mood normal.        Behavior: Behavior normal.         Outpatient Encounter Medications as of 03/04/2024  Medication Sig   atorvastatin  (LIPITOR) 40 MG tablet Take 1 tablet (40 mg total) by mouth daily.   diltiazem  (CARDIZEM  CD) 240 MG 24 hr capsule Take 1 capsule (240 mg total) by mouth daily.   EPINEPHrine  0.3 mg/0.3 mL IJ SOAJ injection USE AS DIRECTED   levothyroxine (SYNTHROID, LEVOTHROID) 25 MCG tablet Take 25 mcg by mouth daily before breakfast.   losartan  (COZAAR ) 100 MG tablet Take 1 tablet (100 mg total) by mouth daily.   pantoprazole  (PROTONIX ) 40 MG tablet Take 1 tablet (40 mg total) by mouth daily.   triamterene -hydrochlorothiazide  (MAXZIDE-25) 37.5-25 MG tablet Take 1/2 tablet by mouth daily.   No facility-administered encounter medications on file as of 03/04/2024.     Lab Results  Component Value Date   WBC 5.5 10/11/2023   HGB 12.1 10/11/2023   HCT 37.7 10/11/2023   PLT 196.0 10/11/2023   GLUCOSE 93 01/30/2024   CHOL 162 01/30/2024   TRIG 87.0 01/30/2024   HDL 57.20 01/30/2024   LDLCALC 88 01/30/2024   ALT 10 01/30/2024   AST 12 01/30/2024   NA 139 01/30/2024   K 3.7 01/30/2024   CL 102 01/30/2024   CREATININE 0.96 01/30/2024   BUN 14 01/30/2024   CO2 28 01/30/2024   TSH 2.12 03/17/2023   HGBA1C 6.5 01/30/2024   MICROALBUR <0.7 10/11/2023    MM 3D SCREENING MAMMOGRAM BILATERAL BREAST Result Date: 09/15/2023 CLINICAL DATA:  Screening. EXAM: DIGITAL SCREENING BILATERAL MAMMOGRAM WITH TOMOSYNTHESIS AND CAD TECHNIQUE: Bilateral screening digital craniocaudal and mediolateral oblique mammograms were obtained. Bilateral screening digital breast tomosynthesis was performed. The  images were evaluated with computer-aided detection. COMPARISON:  Previous exam(s). ACR Breast Density Category c: The breasts are heterogeneously dense, which may obscure small masses. FINDINGS: There are no findings suspicious for malignancy. IMPRESSION: No mammographic evidence of malignancy. A result letter of this screening mammogram will be mailed directly to the patient. RECOMMENDATION: Screening mammogram in one year. (Code:SM-B-01Y) BI-RADS CATEGORY  1: Negative. Electronically Signed   By: Alinda Apley M.D.   On: 09/15/2023 16:32   DG Bone Density Result Date: 09/14/2023 EXAM: DUAL X-RAY ABSORPTIOMETRY (DXA) FOR BONE MINERAL DENSITY IMPRESSION: Dear Dr Geralyn Knee, Your patient CHARLENA RADWANSKI completed a FRAX assessment on 09/14/2023 using the Lunar iDXA DXA System (analysis version: 14.10) manufactured by [employer]. The following summarizes the results of our evaluation. PATIENT BIOGRAPHICAL: Name: Derionna, Hudman Patient ID: [id number] Birth Date: October 08, 1941 Height:    63.0 in. Gender:     Female    Age:        82.3       Weight:    134.0 lbs. Ethnicity:  Black                            Exam Date: 09/14/2023 FRAX* RESULTS:  (version: 3.5) 10-year Probability of Fracture1 Major Osteoporotic Fracture2 Hip Fracture 6.0% 1.4% Population: [place]  (Black) Risk Factors: None Based on Femur (Left) Neck BMD 1 -The 10-year probability of fracture may be lower than reported if the patient has received treatment. 2 -Major Osteoporotic Fracture: Clinical Spine, Forearm, Hip or Shoulder *FRAX is a Armed forces logistics/support/administrative officer of the [employer] of [employer] for Metabolic Bone Disease,  a World Science writer (WHO) [employer]. ASSESSMENT: The probability of a major osteoporotic fracture is 6.0% within the next ten years. The probability of a hip fracture is 1.4% within the next ten years. . Your patient Janaea Starke completed a BMD test on 09/14/2023 using the [employer] DXA System (software version: 14.10)  manufactured by [employer]. The following summarizes the results of our evaluation. Technologist: MTB PATIENT BIOGRAPHICAL: Name: Aliesha, Kreie Patient ID: [id number] Birth Date: [birth year] Height: 63.0 in. Gender: Female Exam Date: 09/14/2023 Weight: 134.0 lbs. Indications: Hysterectomy, Postmenopausal Fractures: Treatments: DENSITOMETRY RESULTS: Site         Region     Measured Date Measured Age WHO Classification Young Adult T-score BMD         %Change vs. Previous Significant Change (*) AP Spine L1-L3 09/14/2023 80.3 Normal -0.1 1.173 g/cm2 - - DualFemur Neck Left 09/14/2023 80.3 Osteopenia -1.5 0.833 g/cm2 - - DualFemur Total Mean 09/14/2023 80.3 Normal -0.7 0.915 g/cm2 - - Left Forearm Radius 33% 09/14/2023 80.3 Normal 0.0 0.878 g/cm2 - - ASSESSMENT: The BMD measured at Femur Neck Left is 0.833 g/cm2 with a T-score of -1.5. This patient's diagnostic category is LOW BONE MASS/OSTEOPENIA according to World Health Organization [facility]) criteria. The scan quality is good. L-4 was excluded due to degenerative changes. World Science writer [facility]) criteria for post-menopausal, Caucasian Women: Normal:                   T-score at or above -1 SD Osteopenia/low bone mass: T-score between -1 and -2.5 SD Osteoporosis:             T-score at or below -2.5 SD RECOMMENDATIONS: 1. All patients should optimize calcium  and vitamin D intake. 2. Consider FDA-approved medical therapies in postmenopausal women and men aged 34 years and older, based on the following: a. A hip or vertebral(clinical or morphometric) fracture b. T-score < -2.5 at the femoral neck or spine after appropriate evaluation to exclude secondary causes c. Low bone mass (T-score between -1.0 and -2.5 at the femoral neck or spine) and a 10-year probability of a hip fracture > 3% or a 10-year probability of a major osteoporosis-related fracture > 20% based on the [place] -adapted WHO algorithm 3. Clinician judgment and/or patient preferences may indicate  treatment for people with 10-year fracture probabilities above or below these levels FOLLOW-UP: People with diagnosed cases of osteoporosis or at high risk for fracture should have regular bone mineral density tests. For patients eligible for Medicare, routine testing is allowed once every 2 years. The testing frequency can be increased to one year for patients who have rapidly progressing disease, those who are receiving or discontinuing medical therapy to restore bone mass, or have additional risk factors. I have reviewed this report, and agree with the above findings. [facility] Electronically Signed   By: Sundra Engel M.D.   On: 09/14/2023 11:45       Assessment & Plan:  Right foot pain Assessment & Plan: Right foot/heel pain as outlined. Discussed possible plantar fasciitis. Also possible heel spur. Discussed plantar fasciitis, heel spur and arthritis. Discussed foot stretches. Discussed the need to wear support shoes. Avoid going barefooted. Tylenol  as directed. Check xray. Further w/up pending results and response to above measures.   Orders: -     DG Foot 2 Views Right; Future  Stage 3a chronic kidney disease (HCC) Assessment & Plan: Stay hydrated. Avoid antiinflammatories.  Continue losartan .  Follow metabolic panel.  Recent GFR 55.    Type 2 diabetes mellitus with hyperglycemia, without long-term current use of insulin (HCC) Assessment & Plan: Low carb diet and exercise given elevated blood sugars.  Follow met b and a1c.  Lab Results  Component Value Date   HGBA1C 6.5 01/30/2024        Dellar Fenton, MD

## 2024-03-04 NOTE — Assessment & Plan Note (Signed)
 Right foot/heel pain as outlined. Discussed possible plantar fasciitis. Also possible heel spur. Discussed plantar fasciitis, heel spur and arthritis. Discussed foot stretches. Discussed the need to wear support shoes. Avoid going barefooted. Tylenol  as directed. Check xray. Further w/up pending results and response to above measures.

## 2024-03-04 NOTE — Assessment & Plan Note (Signed)
 Low carb diet and exercise given elevated blood sugars.  Follow met b and a1c.  Lab Results  Component Value Date   HGBA1C 6.5 01/30/2024

## 2024-06-12 ENCOUNTER — Ambulatory Visit: Admitting: *Deleted

## 2024-06-12 VITALS — Ht 64.0 in | Wt 137.0 lb

## 2024-06-12 DIAGNOSIS — Z0000 Encounter for general adult medical examination without abnormal findings: Secondary | ICD-10-CM

## 2024-06-12 NOTE — Patient Instructions (Signed)
 [name] , Thank you for taking time out of your busy schedule to complete your Annual Wellness Visit with me. I enjoyed our conversation and look forward to speaking with you again next year. I, as well as your care team,  appreciate your ongoing commitment to your health goals. Please review the following plan we discussed and let me know if I can assist you in the future. Your Game plan/ To Do List    Referrals: If you haven't heard from the office you've been referred to, please reach out to them at the phone provided.  Remember to call and schedule a diabetic eye exam and get your annual flu vaccine  Follow up Visits: We will see or speak with you next year for your Next Medicare AWV with our clinical staff 06/16/25 @ 10:10 Have you seen your provider in the last 6 months (3 months if uncontrolled diabetes)? Yes  Clinician Recommendations:  Aim for 30 minutes of exercise or brisk walking, 6-8 glasses of water, and 5 servings of fruits and vegetables each day.       This is a list of the screenings recommended for you:  Health Maintenance  Topic Date Due   Yearly kidney health urinalysis for diabetes  Never done   Zoster (Shingles) Vaccine (1 of 2) Never done   COVID-19 Vaccine (4 - 2024-25 season) 07/02/2023   Eye exam for diabetics  12/28/2023   Complete foot exam   02/09/2024   Flu Shot  05/31/2024   Hemoglobin A1C  07/31/2024   Mammogram  09/13/2024   Yearly kidney function blood test for diabetes  01/29/2025   Medicare Annual Wellness Visit  06/12/2025   DTaP/Tdap/Td vaccine (3 - Td or Tdap) 10/21/2028   Pneumococcal Vaccine for age over 69  Completed   DEXA scan (bone density measurement)  Completed   Hepatitis B Vaccine  Aged Out   HPV Vaccine  Aged Out   Meningitis B Vaccine  Aged Out   Colon Cancer Screening  Discontinued   Hepatitis C Screening  Discontinued    Advanced directives: (Copy Requested) Please bring a copy of your health care power of attorney and living  will to the office to be added to your chart at your convenience. You can mail to [facility] [address] [place], [place] [address] or email to [email or web address] Advance Care Planning is important because it:  [x]  Makes sure you receive the medical care that is consistent with your values, goals, and preferences  [x]  It provides guidance to your family and loved ones and reduces their decisional burden about whether or not they are making the right decisions based on your wishes.

## 2024-06-12 NOTE — Progress Notes (Signed)
 Subjective:   Christina Cole is a 83 y.o. who presents for a Medicare Wellness preventive visit.  As a reminder, Annual Wellness Visits don't include a physical exam, and some assessments may be limited, especially if this visit is performed virtually. We may recommend an in-person follow-up visit with your provider if needed.  Visit Complete: Virtual I connected with  Christina Cole on 06/12/24 by a audio enabled telemedicine application and verified that I am speaking with the correct person using two identifiers.  Patient Location: Home  Provider Location: Home Office  I discussed the limitations of evaluation and management by telemedicine. The patient expressed understanding and agreed to proceed.  Vital Signs: Because this visit was a virtual/telehealth visit, some criteria may be missing or patient reported. Any vitals not documented were not able to be obtained and vitals that have been documented are patient reported.  VideoDeclined- This patient declined Librarian, academic. Therefore the visit was completed with audio only.  Persons Participating in Visit: Patient.  AWV Questionnaire: No: Patient Medicare AWV questionnaire was not completed prior to this visit.  Cardiac Risk Factors include: advanced age (>59men, >60 women);diabetes mellitus;dyslipidemia;hypertension     Objective:    Today's Vitals   06/12/24 1052  Weight: 137 lb (62.1 kg)  Height: 5' 4 (1.626 m)   Body mass index is 23.52 kg/m.     06/12/2024   11:03 AM 12/30/2022    1:32 PM 10/21/2018    4:50 PM 07/20/2017   11:05 AM 02/05/2017    8:20 PM 02/25/2015    1:45 PM  Advanced Directives  Does Patient Have a Medical Advance Directive? Yes Yes No  No  No  Yes   Type of Estate agent of [place];Living will Healthcare Power of [place];Living will    Healthcare Power of Attorney   Does patient want to make changes to medical advance directive?  No -  Patient declined    No - Patient declined   Copy of Healthcare Power of Attorney in Chart? No - copy requested No - copy requested      Would patient like information on creating a medical advance directive?   No - Patient declined  No - Patient declined  No - Patient declined       Data saved with a previous flowsheet row definition    Current Medications (verified) Outpatient Encounter Medications as of 06/12/2024  Medication Sig   atorvastatin  (LIPITOR) 40 MG tablet Take 1 tablet (40 mg total) by mouth daily.   diltiazem  (CARDIZEM  CD) 240 MG 24 hr capsule Take 1 capsule (240 mg total) by mouth daily.   EPINEPHrine  0.3 mg/0.3 mL IJ SOAJ injection USE AS DIRECTED   levothyroxine (SYNTHROID, LEVOTHROID) 25 MCG tablet Take 25 mcg by mouth daily before breakfast.   losartan  (COZAAR ) 100 MG tablet Take 1 tablet (100 mg total) by mouth daily.   pantoprazole  (PROTONIX ) 40 MG tablet Take 1 tablet (40 mg total) by mouth daily.   triamterene -hydrochlorothiazide  (MAXZIDE-25) 37.5-25 MG tablet Take 1/2 tablet by mouth daily.   No facility-administered encounter medications on file as of 06/12/2024.    Allergies (verified) Sulfa antibiotics   History: Past Medical History:  Diagnosis Date   Heart murmur    Hyperlipidemia    Hypertension    Thyroid  disease    goiter (follows with Dr. Christi)   Past Surgical History:  Procedure Laterality Date   ABDOMINAL HYSTERECTOMY  1978   ovaries left  in place   APPENDECTOMY  1978   BREAST BIOPSY Right 09/29/2017   Pt had Bx  done in [facility] @ 3:00 per chart-FIBROCYSTIC CHANGES.   COLONOSCOPY WITH PROPOFOL  N/A 07/11/2017   Procedure: COLONOSCOPY WITH PROPOFOL ;  Surgeon: Jinny Carmine, MD;  Location[facility] ENDOSCOPY;  Service: Endoscopy;  Laterality: N/A;   TUBAL LIGATION     Family History  Problem Relation Age of Onset   Stroke Mother    Hypertension Mother    Arthritis Mother    Breast cancer Sister 20   Breast cancer Sister 72    Lung cancer Brother    Diabetes Brother    Social History   Socioeconomic History   Marital status: Single    Spouse name: Not on file   Number of children: 3   Years of education: Not on file   Highest education level: Not on file  Occupational History   Not on file  Tobacco Use   Smoking status: Former   Smokeless tobacco: Never  Vaping Use   Vaping status: Never Used  Substance and Sexual Activity   Alcohol use: No    Alcohol/week: 0.0 standard drinks of alcohol   Drug use: No   Sexual activity: Not on file  Other Topics Concern   Not on file  Social History Narrative   Twin sister died Nov 29, 2021 and her brother died November 29, 2021    Social Drivers of Health   Financial Resource Strain: Low Risk  (06/12/2024)   Overall Financial Resource Strain (CARDIA)    Difficulty of Paying Living Expenses: Not hard at all  Food Insecurity: No Food Insecurity (06/12/2024)   Hunger Vital Sign    Worried About Running Out of Food in the Last Year: Never true    Ran Out of Food in the Last Year: Never true  Transportation Needs: No Transportation Needs (06/12/2024)   PRAPARE - Administrator, Civil Service (Medical): No    Lack of Transportation (Non-Medical): No  Physical Activity: Inactive (06/12/2024)   Exercise Vital Sign    Days of Exercise per Week: 0 days    Minutes of Exercise per Session: 0 min  Stress: No Stress Concern Present (06/12/2024)   [employer] of Occupational Health - Occupational Stress Questionnaire    Feeling of Stress: Not at all  Social Connections: Moderately Integrated (06/12/2024)   Social Connection and Isolation Panel    Frequency of Communication with Friends and Family: More than three times a week    Frequency of Social Gatherings with Friends and Family: More than three times a week    Attends Religious Services: More than 4 times per year    Active Member of [employer] or Organizations: Yes    Attends Engineer, structural: More than  4 times per year    Marital Status: Never married    Tobacco Counseling Counseling given: Not Answered    Clinical Intake:  Pre-visit preparation completed: Yes  Pain : No/denies pain     BMI - recorded: 23.52 Nutritional Status: BMI of 19-24  Normal Nutritional Risks: None Diabetes: Yes CBG done?: No Did pt. bring in CBG monitor from home?: No  Lab Results  Component Value Date   HGBA1C 6.5 01/30/2024   HGBA1C 6.4 10/11/2023   HGBA1C 6.4 06/09/2023     How often do you need to have someone help you when you read instructions, pamphlets, or other written materials from your doctor or pharmacy?: 1 - Never  Interpreter Needed?: No  Information entered by :: R. Leemon Ayala LPN   Activities of Daily Living     06/12/2024   10:54 AM  In your present state of health, do you have any difficulty performing the following activities:  Hearing? 0  Vision? 0  Difficulty concentrating or making decisions? 0  Walking or climbing stairs? 0  Dressing or bathing? 0  Doing errands, shopping? 0  Preparing Food and eating ? N  Using the Toilet? N  In the past six months, have you accidently leaked urine? N  Do you have problems with loss of bowel control? N  Managing your Medications? N  Managing your Finances? N  Housekeeping or managing your Housekeeping? N    Patient Care Team: Glendia Shad, MD as PCP - General (Internal Medicine) Darliss Rogue, MD as PCP - Cardiology (Cardiology) Christi Vannie PARAS, MD as Attending Physician (Endocrinology)  I have updated your Care Teams any recent Medical Services you may have received from other providers in the past year.     Assessment:   This is a routine wellness examination for Christina Cole.  Hearing/Vision screen Hearing Screening - Comments:: No issues Vision Screening - Comments:: readers   Goals Addressed             This Visit's Progress    Patient Stated       Wants to start back walking        Depression  Screen     06/12/2024   10:58 AM 04/07/2023   11:48 AM 12/30/2022    1:34 PM 10/07/2022    7:09 AM 08/11/2022    9:45 AM 06/29/2022   11:27 AM 04/28/2022   10:28 AM  PHQ 2/9 Scores  PHQ - 2 Score 0 0 0 0 0 0 0  PHQ- 9 Score 0          Fall Risk     06/12/2024   10:55 AM 04/07/2023   11:48 AM 12/30/2022    1:34 PM 10/07/2022    7:09 AM 08/11/2022    9:45 AM  Fall Risk   Falls in the past year? 0 0 0 0 0  Number falls in past yr: 0 0 0 0   Injury with Fall? 0 0 0 0   Risk for fall due to : No Fall Risks No Fall Risks  No Fall Risks No Fall Risks  Follow up Falls evaluation completed;Falls prevention discussed Falls evaluation completed Falls evaluation completed;Falls prevention discussed Falls evaluation completed  Falls evaluation completed      Data saved with a previous flowsheet row definition    MEDICARE RISK AT HOME:  Medicare Risk at Home Any stairs in or around the home?: Yes If so, are there any without handrails?: No Home free of loose throw rugs in walkways, pet beds, electrical cords, etc?: Yes Adequate lighting in your home to reduce risk of falls?: Yes Life alert?: No Use of a cane, walker or w/c?: No Grab bars in the bathroom?: Yes Shower chair or bench in shower?: No Elevated toilet seat or a handicapped toilet?: No  TIMED UP AND GO:  Was the test performed?  No  Cognitive Function: 6CIT completed    07/20/2017   11:18 AM 02/25/2015    1:51 PM  MMSE - Mini Mental State Exam  Orientation to time 5  5   Orientation to Place 5  5   Registration 3  3   Attention/ Calculation 5  5  Recall 3  3   Language- name 2 objects 2  2   Language- repeat 1 1  Language- follow 3 step command 3  3   Language- read & follow direction 1  1   Write a sentence 1  1   Copy design 1  1   Total score 30  30      Data saved with a previous flowsheet row definition        06/12/2024   11:03 AM 12/30/2022    1:43 PM  6CIT Screen  What Year? 0 points 0 points  What month?  0 points 0 points  What time? 0 points 0 points  Count back from 20 0 points 0 points  Months in reverse 0 points 0 points  Repeat phrase 0 points 0 points  Total Score 0 points 0 points    Immunizations Immunization History  Administered Date(s) Administered   DTaP 12/21/2010   Fluad Quad(high Dose 65+) 07/15/2019, 07/29/2022   Influenza Split 07/21/2013   Influenza, High Dose Seasonal PF 07/18/2017, 07/06/2018, 08/09/2021, 08/31/2023   Influenza-Unspecified 07/02/2014, 08/05/2015, 06/10/2016, 08/12/2020   PFIZER(Purple Top)SARS-COV-2 Vaccination 01/15/2020, 02/04/2020, 10/06/2020   Pneumococcal Conjugate-13 12/22/2011   Pneumococcal Polysaccharide-23 02/25/2015   Tdap 10/21/2018    Screening Tests Health Maintenance  Topic Date Due   Diabetic kidney evaluation - Urine ACR  Never done   Zoster Vaccines- Shingrix (1 of 2) Never done   COVID-19 Vaccine (4 - 2024-25 season) 07/02/2023   OPHTHALMOLOGY EXAM  12/28/2023   Medicare Annual Wellness (AWV)  12/30/2023   FOOT EXAM  02/09/2024   INFLUENZA VACCINE  05/31/2024   HEMOGLOBIN A1C  07/31/2024   MAMMOGRAM  09/13/2024   Diabetic kidney evaluation - eGFR measurement  01/29/2025   DTaP/Tdap/Td (3 - Td or Tdap) 10/21/2028   Pneumococcal Vaccine: 50+ Years  Completed   DEXA SCAN  Completed   Hepatitis B Vaccines  Aged Out   HPV VACCINES  Aged Out   Meningococcal B Vaccine  Aged Out   Colonoscopy  Discontinued   Hepatitis C Screening  Discontinued    Health Maintenance  Health Maintenance Due  Topic Date Due   Diabetic kidney evaluation - Urine ACR  Never done   Zoster Vaccines- Shingrix (1 of 2) Never done   COVID-19 Vaccine (4 - 2024-25 season) 07/02/2023   OPHTHALMOLOGY EXAM  12/28/2023   Medicare Annual Wellness (AWV)  12/30/2023   FOOT EXAM  02/09/2024   INFLUENZA VACCINE  05/31/2024   Health Maintenance Items Addressed: Patient declines covid and shingles vaccines at this time.  Patient needs a foot exam  completed and documented at next visit.  Additional Screening:  Vision Screening: Recommended annual ophthalmology exams for early detection of glaucoma and other disorders of the eye. Overdue  Dr. Carolee   Patient stated that she will call and schedule a diabetic eye exam. Would you like a referral to an eye doctor? No    Dental Screening: Recommended annual dental exams for proper oral hygiene  Community Resource Referral / Chronic Care Management: CRR required this visit?  No   CCM required this visit?  No   Plan:    I have personally reviewed and noted the following in the patient's chart:   Medical and social history Use of alcohol, tobacco or illicit drugs  Current medications and supplements including opioid prescriptions. Patient is not currently taking opioid prescriptions. Functional ability and status Nutritional status Physical activity Advanced directives List of other  physicians Hospitalizations, surgeries, and ER visits in previous 12 months Vitals Screenings to include cognitive, depression, and falls Referrals and appointments  In addition, I have reviewed and discussed with patient certain preventive protocols, quality metrics, and best practice recommendations. A written personalized care plan for preventive services as well as general preventive health recommendations were provided to patient.   Angeline Fredericks, LPN   [id number]   After Visit Summary: (Pick Up) Due to this being a telephonic visit, with patients personalized plan was offered to patient and patient has requested to Pick up at office.  Notes: Nothing significant to report at this time.

## 2024-06-17 ENCOUNTER — Ambulatory Visit: Payer: Self-pay

## 2024-06-17 ENCOUNTER — Telehealth: Payer: Self-pay

## 2024-06-17 ENCOUNTER — Ambulatory Visit (INDEPENDENT_AMBULATORY_CARE_PROVIDER_SITE_OTHER): Admitting: Internal Medicine

## 2024-06-17 ENCOUNTER — Encounter: Payer: Self-pay | Admitting: Internal Medicine

## 2024-06-17 VITALS — BP 138/80 | HR 73 | Temp 98.0°F | Ht 64.0 in | Wt 138.6 lb

## 2024-06-17 DIAGNOSIS — R238 Other skin changes: Secondary | ICD-10-CM

## 2024-06-17 DIAGNOSIS — E1165 Type 2 diabetes mellitus with hyperglycemia: Secondary | ICD-10-CM | POA: Diagnosis not present

## 2024-06-17 DIAGNOSIS — I10 Essential (primary) hypertension: Secondary | ICD-10-CM

## 2024-06-17 MED ORDER — TRIAMCINOLONE ACETONIDE 0.1 % EX CREA
1.0000 | TOPICAL_CREAM | Freq: Two times a day (BID) | CUTANEOUS | 0 refills | Status: DC
Start: 2024-06-17 — End: 2024-09-10

## 2024-06-17 NOTE — Telephone Encounter (Signed)
 See other note

## 2024-06-17 NOTE — Telephone Encounter (Signed)
 Called patient and confirmed no other acute symptoms. Patient has a red area on her right thigh that was itching. Not sure what she got bit by. Would like Dr Glendia to look at it to make sure that it is not infected.. Scheduled for work in appointment at 4pm.

## 2024-06-17 NOTE — Telephone Encounter (Unsigned)
 Copied from CRM #[id number]. Topic: General - Other >> Jun 17, 2024  2:17 PM Chasity T wrote: Reason for CRM: Patient is returning call to [place]. Transferred to clinic.

## 2024-06-17 NOTE — Progress Notes (Addendum)
 Subjective:    Patient ID: Christina Cole, female    DOB: [birth year], 83 y.o.   MRN: [id number]  Patient here for  Chief Complaint  Patient presents with   Medical Management of Chronic Issues    Insect bites on her right leg  and lower stomach area     HPI Here for a work in appt. Work in with concerns regarding possible bites - right lower leg. Noticed two days ago. Does itch. Right inner thigh - bullous formed. Smaller lesions - right leg. Small lesion lower abdomen. No other rash. No fever. No headache. No joint aches. No sob or congestion. Reports in 2023 - was seen for similar [facility] - diagnosed with insect bite.    Past Medical History:  Diagnosis Date   Heart murmur    Hyperlipidemia    Hypertension    Thyroid  disease    goiter (follows with Dr. Christi)   Past Surgical History:  Procedure Laterality Date   ABDOMINAL HYSTERECTOMY  1978   ovaries left in place   APPENDECTOMY  1978   BREAST BIOPSY Right 09/29/2017   Pt had Bx  done in [facility]'s office [facility] @ 3:00 per chart-FIBROCYSTIC CHANGES.   COLONOSCOPY WITH PROPOFOL  N/A 07/11/2017   Procedure: COLONOSCOPY WITH PROPOFOL ;  Surgeon: Jinny Carmine, MD;  Location: [facility] ENDOSCOPY;  Service: Endoscopy;  Laterality: N/A;   TUBAL LIGATION     Family History  Problem Relation Age of Onset   Stroke Mother    Hypertension Mother    Arthritis Mother    Breast cancer Sister 62   Breast cancer Sister 17   Lung cancer Brother    Diabetes Brother    Social History   Socioeconomic History   Marital status: Single    Spouse name: Not on file   Number of children: 3   Years of education: Not on file   Highest education level: Not on file  Occupational History   Not on file  Tobacco Use   Smoking status: Former   Smokeless tobacco: Never  Vaping Use   Vaping status: Never Used  Substance and Sexual Activity   Alcohol use: No    Alcohol/week: 0.0 standard drinks of alcohol   Drug use: No   Sexual  activity: Not on file  Other Topics Concern   Not on file  Social History Narrative   Twin sister died 11-11-2021 and her brother died 11-11-21    Social Drivers of Health   Financial Resource Strain: Low Risk  (06/12/2024)   Overall Financial Resource Strain (CARDIA)    Difficulty of Paying Living Expenses: Not hard at all  Food Insecurity: No Food Insecurity (06/12/2024)   Hunger Vital Sign    Worried About Running Out of Food in the Last Year: Never true    Ran Out of Food in the Last Year: Never true  Transportation Needs: No Transportation Needs (06/12/2024)   PRAPARE - Administrator, Civil Service (Medical): No    Lack of Transportation (Non-Medical): No  Physical Activity: Inactive (06/12/2024)   Exercise Vital Sign    Days of Exercise per Week: 0 days    Minutes of Exercise per Session: 0 min  Stress: No Stress Concern Present (06/12/2024)   [employer] of Occupational Health - Occupational Stress Questionnaire    Feeling of Stress: Not at all  Social Connections: Moderately Integrated (06/12/2024)   Social Connection and Isolation Panel    Frequency of Communication with  Friends and Family: More than three times a week    Frequency of Social Gatherings with Friends and Family: More than three times a week    Attends Religious Services: More than 4 times per year    Active Member of [employer] or Organizations: Yes    Attends Engineer, structural: More than 4 times per year    Marital Status: Never married     Review of Systems  Constitutional:  Negative for appetite change and fever.  HENT:  Negative for congestion and sinus pressure.   Respiratory:  Negative for cough, chest tightness and shortness of breath.   Cardiovascular:  Negative for chest pain, palpitations and leg swelling.  Gastrointestinal:  Negative for abdominal pain, diarrhea, nausea and vomiting.  Genitourinary:  Negative for difficulty urinating and dysuria.  Musculoskeletal:  Negative  for joint swelling and myalgias.  Skin:  Positive for rash. Negative for color change.  Neurological:  Negative for dizziness and headaches.  Psychiatric/Behavioral:  Negative for agitation and dysphoric mood.        Objective:     BP 138/80 (BP Location: Left Arm, Patient Position: Sitting, Cuff Size: Normal)   Pulse 73   Temp 98 F (36.7 C) (Oral)   Ht 5' 4 (1.626 m)   Wt 138 lb 9.6 oz (62.9 kg)   LMP 12/21/1976   SpO2 99%   BMI 23.79 kg/m  Wt Readings from Last 3 Encounters:  06/17/24 138 lb 9.6 oz (62.9 kg)  06/12/24 137 lb (62.1 kg)  03/04/24 136 lb 6.4 oz (61.9 kg)    Physical Exam Vitals reviewed.  Constitutional:      General: She is not in acute distress.    Appearance: Normal appearance.  HENT:     Head: Normocephalic and atraumatic.     Right Ear: External ear normal.     Left Ear: External ear normal.  Eyes:     General: No scleral icterus.       Right eye: No discharge.        Left eye: No discharge.     Conjunctiva/sclera: Conjunctivae normal.  Neck:     Thyroid : No thyromegaly.  Cardiovascular:     Rate and Rhythm: Normal rate and regular rhythm.  Pulmonary:     Effort: No respiratory distress.     Breath sounds: Normal breath sounds. No wheezing.  Abdominal:     General: Bowel sounds are normal.     Palpations: Abdomen is soft.     Tenderness: There is no abdominal tenderness.  Musculoskeletal:        General: No swelling or tenderness.     Cervical back: Neck supple. No tenderness.  Lymphadenopathy:     Cervical: No cervical adenopathy.  Skin:    Comments: Bullous lesion - right inner thigh. Few small scattered lesions - right lower leg.   Neurological:     Mental Status: She is alert.  Psychiatric:        Mood and Affect: Mood normal.        Behavior: Behavior normal.         Outpatient Encounter Medications as of 06/17/2024  Medication Sig   atorvastatin  (LIPITOR) 40 MG tablet Take 1 tablet (40 mg total) by mouth daily.    diltiazem  (CARDIZEM  CD) 240 MG 24 hr capsule Take 1 capsule (240 mg total) by mouth daily.   EPINEPHrine  0.3 mg/0.3 mL IJ SOAJ injection USE AS DIRECTED   levothyroxine (SYNTHROID, LEVOTHROID) 25 MCG tablet Take  25 mcg by mouth daily before breakfast.   losartan  (COZAAR ) 100 MG tablet Take 1 tablet (100 mg total) by mouth daily.   pantoprazole  (PROTONIX ) 40 MG tablet Take 1 tablet (40 mg total) by mouth daily.   triamcinolone  cream (KENALOG ) 0.1 % Apply 1 Application topically 2 (two) times daily.   triamterene -hydrochlorothiazide  (MAXZIDE-25) 37.5-25 MG tablet Take 1/2 tablet by mouth daily.   No facility-administered encounter medications on file as of 06/17/2024.     Lab Results  Component Value Date   WBC 5.5 10/11/2023   HGB 12.1 10/11/2023   HCT 37.7 10/11/2023   PLT 196.0 10/11/2023   GLUCOSE 96 06/18/2024   CHOL 174 06/18/2024   TRIG 85.0 06/18/2024   HDL 56.70 06/18/2024   LDLCALC 101 (H) 06/18/2024   ALT 15 06/18/2024   AST 15 06/18/2024   NA 136 06/18/2024   K 3.4 (L) 06/18/2024   CL 101 06/18/2024   CREATININE 1.01 06/18/2024   BUN 13 06/18/2024   CO2 26 06/18/2024   TSH 2.12 03/17/2023   HGBA1C 6.7 (H) 06/18/2024    MM 3D SCREENING MAMMOGRAM BILATERAL BREAST Result Date: 09/15/2023 CLINICAL DATA:  Screening. EXAM: DIGITAL SCREENING BILATERAL MAMMOGRAM WITH TOMOSYNTHESIS AND CAD TECHNIQUE: Bilateral screening digital craniocaudal and mediolateral oblique mammograms were obtained. Bilateral screening digital breast tomosynthesis was performed. The images were evaluated with computer-aided detection. COMPARISON:  Previous exam(s). ACR Breast Density Category c: The breasts are heterogeneously dense, which may obscure small masses. FINDINGS: There are no findings suspicious for malignancy. IMPRESSION: No mammographic evidence of malignancy. A result letter of this screening mammogram will be mailed directly to the patient. RECOMMENDATION: Screening mammogram in one year.  (Code:SM-B-01Y) BI-RADS CATEGORY  1: Negative. Electronically Signed   By: Rosaline Collet M.D.   On: 09/15/2023 16:32   DG Bone Density Result Date: 09/14/2023 EXAM: DUAL X-RAY ABSORPTIOMETRY (DXA) FOR BONE MINERAL DENSITY IMPRESSION: Dear Dr Glendia, Your patient SIREN PORRATA completed a FRAX assessment on 09/14/2023 using the Lunar iDXA DXA System (analysis version: 14.10) manufactured by [employer]. The following summarizes the results of our evaluation. PATIENT BIOGRAPHICAL: Name: Tamma, Brigandi Patient ID: [id number] Birth Date: 1941-04-27 Height:    63.0 in. Gender:     Female    [age]       Weight:    134.0 lbs. Ethnicity:  Black                            Exam Date: 09/14/2023 FRAX* RESULTS:  (version: 3.5) 10-year Probability of Fracture1 Major Osteoporotic Fracture2 Hip Fracture 6.0% 1.4% Population: [place]  (Black) Risk Factors: None Based on Femur (Left) Neck BMD 1 -The 10-year probability of fracture may be lower than reported if the patient has received treatment. 2 -Major Osteoporotic Fracture: Clinical Spine, Forearm, Hip or Shoulder *FRAX is a Armed forces logistics/support/administrative officer of the [employer] of [employer] for Metabolic Bone Disease, a World Science writer (WHO) [employer]. ASSESSMENT: The probability of a major osteoporotic fracture is 6.0% within the next ten years. The probability of a hip fracture is 1.4% within the next ten years. . Your patient Jeanann Balinski completed a BMD test on 09/14/2023 using the [employer] DXA System (software version: 14.10) manufactured by [employer]. The following summarizes the results of our evaluation. Technologist: MTB PATIENT BIOGRAPHICAL: Name: Donte, Kary Patient ID: [id number] Birth Date: 1941-01-10 Height: 63.0  in. Gender: Female Exam Date: 09/14/2023 Weight: 134.0 lbs. Indications: Hysterectomy, Postmenopausal Fractures: Treatments: DENSITOMETRY RESULTS: Site         Region     Measured Date Measured Age WHO  Classification Young Adult T-score BMD         %Change vs. Previous Significant Change (*) AP Spine L1-L3 09/14/2023 80.3 Normal -0.1 1.173 g/cm2 - - DualFemur Neck Left 09/14/2023 80.3 Osteopenia -1.5 0.833 g/cm2 - - DualFemur Total Mean 09/14/2023 80.3 Normal -0.7 0.915 g/cm2 - - Left Forearm Radius 33% 09/14/2023 80.3 Normal 0.0 0.878 g/cm2 - - ASSESSMENT: The BMD measured at Femur Neck Left is 0.833 g/cm2 with a T-score of -1.5. This patient's diagnostic category is LOW BONE MASS/OSTEOPENIA according to World Health Organization [facility]) criteria. The scan quality is good. L-4 was excluded due to degenerative changes. World Science writer [facility]) criteria for post-menopausal, Caucasian Women: Normal:                   T-score at or above -1 SD Osteopenia/low bone mass: T-score between -1 and -2.5 SD Osteoporosis:             T-score at or below -2.5 SD RECOMMENDATIONS: 1. All patients should optimize calcium  and vitamin D intake. 2. Consider FDA-approved medical therapies in postmenopausal women and men aged 1 years and older, based on the following: a. A hip or vertebral(clinical or morphometric) fracture b. T-score < -2.5 at the femoral neck or spine after appropriate evaluation to exclude secondary causes c. Low bone mass (T-score between -1.0 and -2.5 at the femoral neck or spine) and a 10-year probability of a hip fracture > 3% or a 10-year probability of a major osteoporosis-related fracture > 20% based on the [place] -adapted WHO algorithm 3. Clinician judgment and/or patient preferences may indicate treatment for people with 10-year fracture probabilities above or below these levels FOLLOW-UP: People with diagnosed cases of osteoporosis or at high risk for fracture should have regular bone mineral density tests. For patients eligible for Medicare, routine testing is allowed once every 2 years. The testing frequency can be increased to one year for patients who have rapidly progressing disease, those who are  receiving or discontinuing medical therapy to restore bone mass, or have additional risk factors. I have reviewed this report, and agree with the above findings. [facility] Electronically Signed   By: Reyes Phi M.D.   On: 09/14/2023 11:45       Assessment & Plan:  Type 2 diabetes mellitus with hyperglycemia, without long-term current use of insulin (HCC) Assessment & Plan: Low carb diet and exercise given elevated blood sugars.  Follow met b and a1c.  Lab Results  Component Value Date   HGBA1C 6.7 (H) 06/18/2024     Essential hypertension, benign Assessment & Plan: Blood pressure as outlined.  Continue 240mg  cardizem . Continues losartan  and triam/hydrochlorothiazide  1/2 tablet per day. Follow pressures.     Bullous lesion Assessment & Plan: Bullous lesion right inner thigh and few scattered lesion right lower leg. Itches. Question of bite. Apply triamcinolone  cream as directed. Follow closely. Notify if any change or worsening symptoms or if incomplete resolution.    Other orders -     Triamcinolone  Acetonide; Apply 1 Application topically 2 (two) times daily.  Dispense: 30 g; Refill: 0     Allena Hamilton, MD

## 2024-06-17 NOTE — Telephone Encounter (Signed)
 FYI Only or Action Required?: Action required by provider: request for appointment.  Patient was last seen in primary care on 03/04/2024 by Glendia Shad, MD.  Called Nurse Triage reporting No chief complaint on file..  Symptoms began several days ago.  Interventions attempted: OTC medications: antibiotic cream and Rest, hydration, or home remedies.  Symptoms are: gradually worsening.  Triage Disposition: See HCP Within 4 Hours (Or PCP Triage)  Patient/caregiver understands and will follow disposition?: Unsure  Copied from CRM #[id number]. Topic: Appointments - Appointment Scheduling >> Jun 17, 2024 11:31 AM Vena HERO wrote: Patient/patient representative is calling to schedule an appointment. No appts available, transferred to nurse triage Reason for Disposition  [1] Looks infected (e.g., spreading redness, pus) AND [2] large red area (> 2 inches or 5 cm)  Answer Assessment - Initial Assessment Questions 1. APPEARANCE of RASH: What does the rash look like? (e.g., blisters, dry flaky skin, red spots, redness, sores)     Right leg, one on stomach, one on knee, two on thight.  2. LOCATION: Where is the rash located?      multiple 3. NUMBER: How many spots are there?      multiple 4. SIZE: How big are the spots? (e.g., inches, cm; or compare to size of pinhead, tip of pen, eraser, pea)      varies 5. ONSET: When did the rash start?      Saturday  6. ITCHING: Does the rash itch? If Yes, ask: How bad is the itch?  (Scale 0-10; or none, mild, moderate, severe)     Moderate 7. PAIN: Does the rash hurt? If Yes, ask: How bad is the pain?  (Scale 0-10; or none, mild, moderate, severe)     No 8. OTHER SYMPTOMS: Do you have any other symptoms? (e.g., fever)     Denies  Additional info: One area on thigh looks infected.  Protocols used: Rash or Redness - Localized-A-AH

## 2024-06-17 NOTE — Telephone Encounter (Signed)
 LM for patient

## 2024-06-17 NOTE — Telephone Encounter (Signed)
 Copied from CRM #[id number]. Topic: Clinical - Medical Advice >> Jun 17, 2024 12:37 PM [place] wrote: Reason for CRM: Patient asking for Sueanne to call her- she has an insect bite on thigh- wanting to ask a question about it

## 2024-06-18 ENCOUNTER — Other Ambulatory Visit (INDEPENDENT_AMBULATORY_CARE_PROVIDER_SITE_OTHER)

## 2024-06-18 DIAGNOSIS — E1165 Type 2 diabetes mellitus with hyperglycemia: Secondary | ICD-10-CM

## 2024-06-18 DIAGNOSIS — E7800 Pure hypercholesterolemia, unspecified: Secondary | ICD-10-CM

## 2024-06-18 LAB — BASIC METABOLIC PANEL WITH GFR
BUN: 13 mg/dL (ref 6–23)
CO2: 26 meq/L (ref 19–32)
Calcium: 9.1 mg/dL (ref 8.4–10.5)
Chloride: 101 meq/L (ref 96–112)
Creatinine, Ser: 1.01 mg/dL (ref 0.40–1.20)
GFR: 52.36 mL/min — ABNORMAL LOW (ref >60.00)
Glucose, Bld: 96 mg/dL (ref 70–99)
Potassium: 3.4 meq/L — ABNORMAL LOW (ref 3.5–5.1)
Sodium: 136 meq/L (ref 135–145)

## 2024-06-18 LAB — LIPID PANEL
Cholesterol: 174 mg/dL (ref 0–200)
HDL: 56.7 mg/dL (ref >39.00)
LDL Cholesterol: 101 mg/dL — ABNORMAL HIGH (ref 0–99)
NonHDL: 117.74
Total CHOL/HDL Ratio: 3
Triglycerides: 85 mg/dL (ref 0.0–149.0)
VLDL: 17 mg/dL (ref 0.0–40.0)

## 2024-06-18 LAB — HEPATIC FUNCTION PANEL
ALT: 15 U/L (ref 0–35)
AST: 15 U/L (ref 0–37)
Albumin: 4.1 g/dL (ref 3.5–5.2)
Alkaline Phosphatase: 55 U/L (ref 39–117)
Bilirubin, Direct: 0.2 mg/dL (ref 0.0–0.3)
Total Bilirubin: 1.4 mg/dL — ABNORMAL HIGH (ref 0.2–1.2)
Total Protein: 6.9 g/dL (ref 6.0–8.3)

## 2024-06-18 LAB — HEMOGLOBIN A1C: Hgb A1c MFr Bld: 6.7 % — ABNORMAL HIGH (ref 4.6–6.5)

## 2024-06-19 ENCOUNTER — Telehealth: Payer: Self-pay

## 2024-06-19 NOTE — Telephone Encounter (Signed)
 Copied from CRM [id number]. Topic: Clinical - Medical Advice >> Jun 19, 2024  1:18 PM Shereese L wrote: Reason for CRM: patient would like a call back from Lolita in reference to office visit on Monday on her cell phone

## 2024-06-20 ENCOUNTER — Ambulatory Visit: Payer: Self-pay | Admitting: Internal Medicine

## 2024-06-20 ENCOUNTER — Encounter: Admitting: Internal Medicine

## 2024-06-20 NOTE — Telephone Encounter (Signed)
 Patient calling to let me know that spot is still continuing to improve. It was draining for a few days and then stopped but is not painful or looking worse. Sees Dr Glendia to follow up next week.

## 2024-06-22 ENCOUNTER — Encounter: Payer: Self-pay | Admitting: Internal Medicine

## 2024-06-22 DIAGNOSIS — R238 Other skin changes: Secondary | ICD-10-CM | POA: Insufficient documentation

## 2024-06-22 NOTE — Assessment & Plan Note (Signed)
 Bullous lesion right inner thigh and few scattered lesion right lower leg. Itches. Question of bite. Apply triamcinolone  cream as directed. Follow closely. Notify if any change or worsening symptoms or if incomplete resolution.

## 2024-06-22 NOTE — Assessment & Plan Note (Signed)
 Low carb diet and exercise given elevated blood sugars.  Follow met b and a1c.  Lab Results  Component Value Date   HGBA1C 6.7 (H) 06/18/2024

## 2024-06-22 NOTE — Assessment & Plan Note (Signed)
 Blood pressure as outlined.  Continue 240mg  cardizem . Continues losartan  and triam/hydrochlorothiazide  1/2 tablet per day. Follow pressures.

## 2024-06-26 ENCOUNTER — Ambulatory Visit: Admitting: Internal Medicine

## 2024-06-26 VITALS — BP 132/74 | HR 65 | Resp 16 | Ht 64.0 in | Wt 138.6 lb

## 2024-06-26 DIAGNOSIS — R17 Unspecified jaundice: Secondary | ICD-10-CM | POA: Diagnosis not present

## 2024-06-26 DIAGNOSIS — E876 Hypokalemia: Secondary | ICD-10-CM | POA: Diagnosis not present

## 2024-06-26 DIAGNOSIS — E1165 Type 2 diabetes mellitus with hyperglycemia: Secondary | ICD-10-CM | POA: Diagnosis not present

## 2024-06-26 DIAGNOSIS — D649 Anemia, unspecified: Secondary | ICD-10-CM

## 2024-06-26 DIAGNOSIS — I700 Atherosclerosis of aorta: Secondary | ICD-10-CM

## 2024-06-26 DIAGNOSIS — R238 Other skin changes: Secondary | ICD-10-CM

## 2024-06-26 DIAGNOSIS — E079 Disorder of thyroid, unspecified: Secondary | ICD-10-CM

## 2024-06-26 DIAGNOSIS — E7800 Pure hypercholesterolemia, unspecified: Secondary | ICD-10-CM | POA: Diagnosis not present

## 2024-06-26 DIAGNOSIS — I10 Essential (primary) hypertension: Secondary | ICD-10-CM

## 2024-06-26 DIAGNOSIS — N1831 Chronic kidney disease, stage 3a: Secondary | ICD-10-CM

## 2024-06-26 DIAGNOSIS — Z0000 Encounter for general adult medical examination without abnormal findings: Secondary | ICD-10-CM

## 2024-06-26 LAB — HEPATIC FUNCTION PANEL
ALT: 14 U/L (ref 0–35)
AST: 17 U/L (ref 0–37)
Albumin: 4.4 g/dL (ref 3.5–5.2)
Alkaline Phosphatase: 54 U/L (ref 39–117)
Bilirubin, Direct: 0.2 mg/dL (ref 0.0–0.3)
Total Bilirubin: 1.3 mg/dL — ABNORMAL HIGH (ref 0.2–1.2)
Total Protein: 7.8 g/dL (ref 6.0–8.3)

## 2024-06-26 LAB — POTASSIUM: Potassium: 4 meq/L (ref 3.5–5.1)

## 2024-06-26 MED ORDER — PANTOPRAZOLE SODIUM 40 MG PO TBEC: 40.0000 mg | DELAYED_RELEASE_TABLET | Freq: Every day | ORAL | 1 refills | Status: DC

## 2024-06-26 MED ORDER — ATORVASTATIN CALCIUM 40 MG PO TABS: 40.0000 mg | ORAL_TABLET | Freq: Every day | ORAL | 3 refills | Status: AC

## 2024-06-26 MED ORDER — TRIAMTERENE-HCTZ 37.5-25 MG PO TABS: ORAL_TABLET | ORAL | 1 refills | Status: DC

## 2024-06-26 NOTE — Progress Notes (Signed)
 Subjective:    Patient ID: Christina Cole, female    DOB: 1941/07/03, 83 y.o.   MRN: [id number]  Patient here for  Chief Complaint  Patient presents with   Annual Exam    HPI Here for a physical exam. Saw endocrinology for f/u thyroid  goiter. On synthroid. Recommended f/u ultrasound in 02/2024. Need to confirm f/u. Recently evaluated - skin lesions - bullous lesions. Lesions have improved, but the right inner thigh lesion - persistent. No fever. No there rash. Breathing stable. Some sinus drainage. No abdominal pain or bowel change reported.    Past Medical History:  Diagnosis Date   Heart murmur    Hyperlipidemia    Hypertension    Thyroid  disease    goiter (follows with Dr. Christi)   Past Surgical History:  Procedure Laterality Date   ABDOMINAL HYSTERECTOMY  1978   ovaries left in place   APPENDECTOMY  1978   BREAST BIOPSY Right 09/29/2017   Pt had Bx  done in [facility] office [facility] @ 3:00 per chart-FIBROCYSTIC CHANGES.   COLONOSCOPY WITH PROPOFOL  N/A 07/11/2017   Procedure: COLONOSCOPY WITH PROPOFOL ;  Surgeon: Jinny Carmine, MD;  Location: [facility] ENDOSCOPY;  Service: Endoscopy;  Laterality: N/A;   TUBAL LIGATION     Family History  Problem Relation Age of Onset   Stroke Mother    Hypertension Mother    Arthritis Mother    Breast cancer Sister 64   Breast cancer Sister 55   Lung cancer Brother    Diabetes Brother    Social History   Socioeconomic History   Marital status: Single    Spouse name: Not on file   Number of children: 3   Years of education: Not on file   Highest education level: Not on file  Occupational History   Not on file  Tobacco Use   Smoking status: Former   Smokeless tobacco: Never  Vaping Use   Vaping status: Never Used  Substance and Sexual Activity   Alcohol use: No    Alcohol/week: 0.0 standard drinks of alcohol   Drug use: No   Sexual activity: Not on file  Other Topics Concern   Not on file  Social History Narrative   Twin  sister died 02-Dec-2021 and her brother died 2021-12-02    Social Drivers of Health   Financial Resource Strain: Low Risk  (06/12/2024)   Overall Financial Resource Strain (CARDIA)    Difficulty of Paying Living Expenses: Not hard at all  Food Insecurity: No Food Insecurity (06/12/2024)   Hunger Vital Sign    Worried About Running Out of Food in the Last Year: Never true    Ran Out of Food in the Last Year: Never true  Transportation Needs: No Transportation Needs (06/12/2024)   PRAPARE - Administrator, Civil Service (Medical): No    Lack of Transportation (Non-Medical): No  Physical Activity: Inactive (06/12/2024)   Exercise Vital Sign    Days of Exercise per Week: 0 days    Minutes of Exercise per Session: 0 min  Stress: No Stress Concern Present (06/12/2024)   [employer] of Occupational Health - Occupational Stress Questionnaire    Feeling of Stress: Not at all  Social Connections: Moderately Integrated (06/12/2024)   Social Connection and Isolation Panel    Frequency of Communication with Friends and Family: More than three times a week    Frequency of Social Gatherings with Friends and Family: More than three times a week  Attends Religious Services: More than 4 times per year    Active Member of Clubs or Organizations: Yes    Attends Banker Meetings: More than 4 times per year    Marital Status: Never married     Review of Systems  Constitutional:  Negative for appetite change, fever and unexpected weight change.  HENT:  Negative for congestion, sinus pressure and sore throat.   Eyes:  Negative for pain and visual disturbance.  Respiratory:  Negative for cough, chest tightness and shortness of breath.   Cardiovascular:  Negative for chest pain, palpitations and leg swelling.  Gastrointestinal:  Negative for abdominal pain, diarrhea, nausea and vomiting.  Genitourinary:  Negative for difficulty urinating and dysuria.  Musculoskeletal:  Negative for  joint swelling and myalgias.  Skin:  Negative for color change.       Persistent right inner thigh lesion as outlined.   Neurological:  Negative for dizziness and headaches.  Hematological:  Negative for adenopathy. Does not bruise/bleed easily.  Psychiatric/Behavioral:  Negative for agitation and dysphoric mood.        Objective:     BP 132/74   Pulse 65   Resp 16   Ht 5' 4 (1.626 m)   Wt 138 lb 9.6 oz (62.9 kg)   LMP 12/21/1976   SpO2 99%   BMI 23.79 kg/m  Wt Readings from Last 3 Encounters:  06/26/24 138 lb 9.6 oz (62.9 kg)  06/17/24 138 lb 9.6 oz (62.9 kg)  06/12/24 137 lb (62.1 kg)    Physical Exam Vitals reviewed.  Constitutional:      General: She is not in acute distress.    Appearance: Normal appearance. She is well-developed.  HENT:     Head: Normocephalic and atraumatic.     Right Ear: External ear normal.     Left Ear: External ear normal.     Mouth/Throat:     Pharynx: No oropharyngeal exudate or posterior oropharyngeal erythema.  Eyes:     General: No scleral icterus.       Right eye: No discharge.        Left eye: No discharge.     Conjunctiva/sclera: Conjunctivae normal.  Neck:     Thyroid : No thyromegaly.  Cardiovascular:     Rate and Rhythm: Normal rate and regular rhythm.  Pulmonary:     Effort: No tachypnea, accessory muscle usage or respiratory distress.     Breath sounds: Normal breath sounds. No decreased breath sounds or wheezing.  Chest:  Breasts:    Right: No inverted nipple, mass, nipple discharge or tenderness (no axillary adenopathy).     Left: No inverted nipple, mass, nipple discharge or tenderness (no axilarry adenopathy).  Abdominal:     General: Bowel sounds are normal.     Palpations: Abdomen is soft.     Tenderness: There is no abdominal tenderness.  Musculoskeletal:        General: No swelling or tenderness.     Cervical back: Neck supple.  Lymphadenopathy:     Cervical: No cervical adenopathy.  Skin:    Findings:  No erythema.     Comments: Persistent circular right inner thigh lesion.   Neurological:     Mental Status: She is alert and oriented to person, place, and time.  Psychiatric:        Mood and Affect: Mood normal.        Behavior: Behavior normal.         Outpatient Encounter Medications as of  06/26/2024  Medication Sig   atorvastatin  (LIPITOR) 40 MG tablet Take 1 tablet (40 mg total) by mouth daily.   diltiazem  (CARDIZEM  CD) 240 MG 24 hr capsule Take 1 capsule (240 mg total) by mouth daily.   EPINEPHrine  0.3 mg/0.3 mL IJ SOAJ injection USE AS DIRECTED   levothyroxine (SYNTHROID, LEVOTHROID) 25 MCG tablet Take 25 mcg by mouth daily before breakfast.   losartan  (COZAAR ) 100 MG tablet Take 1 tablet (100 mg total) by mouth daily.   pantoprazole  (PROTONIX ) 40 MG tablet Take 1 tablet (40 mg total) by mouth daily.   triamcinolone  cream (KENALOG ) 0.1 % Apply 1 Application topically 2 (two) times daily.   triamterene -hydrochlorothiazide  (MAXZIDE-25) 37.5-25 MG tablet Take 1/2 tablet by mouth daily.   [DISCONTINUED] atorvastatin  (LIPITOR) 40 MG tablet Take 1 tablet (40 mg total) by mouth daily.   [DISCONTINUED] pantoprazole  (PROTONIX ) 40 MG tablet Take 1 tablet (40 mg total) by mouth daily.   [DISCONTINUED] triamterene -hydrochlorothiazide  (MAXZIDE-25) 37.5-25 MG tablet Take 1/2 tablet by mouth daily.   No facility-administered encounter medications on file as of 06/26/2024.     Lab Results  Component Value Date   WBC 5.5 10/11/2023   HGB 12.1 10/11/2023   HCT 37.7 10/11/2023   PLT 196.0 10/11/2023   GLUCOSE 96 06/18/2024   CHOL 174 06/18/2024   TRIG 85.0 06/18/2024   HDL 56.70 06/18/2024   LDLCALC 101 (H) 06/18/2024   ALT 14 06/26/2024   AST 17 06/26/2024   NA 136 06/18/2024   K 4.0 06/26/2024   CL 101 06/18/2024   CREATININE 1.01 06/18/2024   BUN 13 06/18/2024   CO2 26 06/18/2024   TSH 2.12 03/17/2023   HGBA1C 6.7 (H) 06/18/2024    MM 3D SCREENING MAMMOGRAM BILATERAL  BREAST Result Date: 09/15/2023 CLINICAL DATA:  Screening. EXAM: DIGITAL SCREENING BILATERAL MAMMOGRAM WITH TOMOSYNTHESIS AND CAD TECHNIQUE: Bilateral screening digital craniocaudal and mediolateral oblique mammograms were obtained. Bilateral screening digital breast tomosynthesis was performed. The images were evaluated with computer-aided detection. COMPARISON:  Previous exam(s). ACR Breast Density Category c: The breasts are heterogeneously dense, which may obscure small masses. FINDINGS: There are no findings suspicious for malignancy. IMPRESSION: No mammographic evidence of malignancy. A result letter of this screening mammogram will be mailed directly to the patient. RECOMMENDATION: Screening mammogram in one year. (Code:SM-B-01Y) BI-RADS CATEGORY  1: Negative. Electronically Signed   By: Rosaline Collet M.D.   On: 09/15/2023 16:32   DG Bone Density Result Date: 09/14/2023 EXAM: DUAL X-RAY ABSORPTIOMETRY (DXA) FOR BONE MINERAL DENSITY IMPRESSION: Dear Dr Glendia, Your patient ALDONIA KEEVEN completed a FRAX assessment on 09/14/2023 using the Lunar iDXA DXA System (analysis version: 14.10) manufactured by [employer]. The following summarizes the results of our evaluation. PATIENT BIOGRAPHICAL: Name: Zakiah, Gauthreaux Patient ID: [id number] Birth Date: 01-22-1941 Height:    63.0 in. Gender:     Female    [age]       Weight:    134.0 lbs. Ethnicity:  Black                            Exam Date: 09/14/2023 FRAX* RESULTS:  (version: 3.5) 10-year Probability of Fracture1 Major Osteoporotic Fracture2 Hip Fracture 6.0% 1.4% Population: [place]  (Black) Risk Factors: None Based on Femur (Left) Neck BMD 1 -The 10-year probability of fracture may be lower than reported if the patient has received treatment. 2 -Major Osteoporotic Fracture:  Clinical Spine, Forearm, Hip or Shoulder *FRAX is a Armed forces logistics/support/administrative officer of the [employer] of [employer] for Metabolic Bone Disease, a World Science writer (WHO)  [employer]. ASSESSMENT: The probability of a major osteoporotic fracture is 6.0% within the next ten years. The probability of a hip fracture is 1.4% within the next ten years. . Your patient [place] Giammona completed a BMD test on 09/14/2023 using the [employer] DXA System (software version: 14.10) manufactured by [employer]. The following summarizes the results of our evaluation. Technologist: MTB PATIENT BIOGRAPHICAL: Name: Lilya, Smitherman Patient ID: [id number] Birth Date: 1940-12-02 Height: 63.0 in. Gender: Female Exam Date: 09/14/2023 Weight: 134.0 lbs. Indications: Hysterectomy, Postmenopausal Fractures: Treatments: DENSITOMETRY RESULTS: Site         Region     Measured Date Measured Age WHO Classification Young Adult T-score BMD         %Change vs. Previous Significant Change (*) AP Spine L1-L3 09/14/2023 80.3 Normal -0.1 1.173 g/cm2 - - DualFemur Neck Left 09/14/2023 80.3 Osteopenia -1.5 0.833 g/cm2 - - DualFemur Total Mean 09/14/2023 80.3 Normal -0.7 0.915 g/cm2 - - Left Forearm Radius 33% 09/14/2023 80.3 Normal 0.0 0.878 g/cm2 - - ASSESSMENT: The BMD measured at Femur Neck Left is 0.833 g/cm2 with a T-score of -1.5. This patient's diagnostic category is LOW BONE MASS/OSTEOPENIA according to World Health Organization [facility]) criteria. The scan quality is good. L-4 was excluded due to degenerative changes. World Science writer [facility]) criteria for post-menopausal, Caucasian Women: Normal:                   T-score at or above -1 SD Osteopenia/low bone mass: T-score between -1 and -2.5 SD Osteoporosis:             T-score at or below -2.5 SD RECOMMENDATIONS: 1. All patients should optimize calcium  and vitamin D intake. 2. Consider FDA-approved medical therapies in postmenopausal women and men aged 14 years and older, based on the following: a. A hip or vertebral(clinical or morphometric) fracture b. T-score < -2.5 at the femoral neck or spine after appropriate evaluation to exclude secondary  causes c. Low bone mass (T-score between -1.0 and -2.5 at the femoral neck or spine) and a 10-year probability of a hip fracture > 3% or a 10-year probability of a major osteoporosis-related fracture > 20% based on the [place] -adapted WHO algorithm 3. Clinician judgment and/or patient preferences may indicate treatment for people with 10-year fracture probabilities above or below these levels FOLLOW-UP: People with diagnosed cases of osteoporosis or at high risk for fracture should have regular bone mineral density tests. For patients eligible for Medicare, routine testing is allowed once every 2 years. The testing frequency can be increased to one year for patients who have rapidly progressing disease, those who are receiving or discontinuing medical therapy to restore bone mass, or have additional risk factors. I have reviewed this report, and agree with the above findings. [facility] Electronically Signed   By: Reyes Phi M.D.   On: 09/14/2023 11:45       Assessment & Plan:  Routine general medical examination at a health care facility  Health care maintenance Assessment & Plan: Physical today 06/25/24.  Mammogram 09/14/23 - Birads I.  Colonoscopy 07/2017.  Recommended f/u in 5 years. Per note and pt - no further colonoscopy   Hypercholesterolemia Assessment & Plan: Continue lipitor.  Low cholesterol diet and exercise.  Follow lipid panel.  Lab Results  Component Value  Date   CHOL 174 06/18/2024   HDL 56.70 06/18/2024   LDLCALC 101 (H) 06/18/2024   TRIG 85.0 06/18/2024   CHOLHDL 3 06/18/2024     Orders: -     Lipid panel; Future -     Hepatic function panel; Future -     Basic metabolic panel with GFR; Future  Type 2 diabetes mellitus with hyperglycemia, without long-term current use of insulin (HCC) Assessment & Plan: Low carb diet and exercise given elevated blood sugars.  Follow met b and A1c.  Lab Results  Component Value Date   HGBA1C 6.7 (H) 06/18/2024     Orders: -     Hemoglobin A1c; Future -     Microalbumin / creatinine urine ratio; Future  Anemia, unspecified type Assessment & Plan: Recheck cbc.   Orders: -     CBC with Differential/Platelet; Future  Elevated bilirubin -     Hepatic function panel; Future  Hypokalemia Assessment & Plan: On triam/hydrochlorothiazide  now. Potassium slightly decreased.  On no potassium supplements. Recheck potassium.   Orders: -     Potassium; Future  Aortic atherosclerosis (HCC) Assessment & Plan: Continue lipitor.    Bullous lesion Assessment & Plan: Recently evaluated - bullous lesion - right innter thigh and few scattered lesions right lower leg. Persistent right inner thigh lesion. Will stop triamcinolone  cream. Will have dermatology evaluate. Call with update.   Orders: -     Ambulatory referral to Dermatology  Stage 3a chronic kidney disease (HCC) Assessment & Plan: Stay hydrated. Avoid antiinflammatories.  Continue losartan . Recent GFR 52. Follow metabolic panel.    Essential hypertension, benign Assessment & Plan: Blood pressure as outlined.  Continue 240mg  cardizem . Continues losartan  and triam/hydrochlorothiazide  1/2 tablet per day. Follow pressures. No changes in medication today.    Thyroid  disease Assessment & Plan: Saw endocrinology - 07/2023 - recommended f/u thyroid  ultrasound 02/2024. Need to confirm f/u.    Other orders -     Atorvastatin  Calcium ; Take 1 tablet (40 mg total) by mouth daily.  Dispense: 90 tablet; Refill: 3 -     Pantoprazole  Sodium; Take 1 tablet (40 mg total) by mouth daily.  Dispense: 90 tablet; Refill: 1 -     Triamterene -HCTZ; Take 1/2 tablet by mouth daily.  Dispense: 45 tablet; Refill: 1     Allena Hamilton, MD

## 2024-06-26 NOTE — Assessment & Plan Note (Signed)
 Physical today 06/25/24.  Mammogram 09/14/23 - Birads I.  Colonoscopy 07/2017.  Recommended f/u in 5 years. Per note and pt - no further colonoscopy

## 2024-06-28 ENCOUNTER — Ambulatory Visit: Payer: Self-pay | Admitting: Internal Medicine

## 2024-06-28 DIAGNOSIS — R17 Unspecified jaundice: Secondary | ICD-10-CM

## 2024-07-02 ENCOUNTER — Telehealth: Payer: Self-pay

## 2024-07-02 ENCOUNTER — Encounter: Payer: Self-pay | Admitting: Internal Medicine

## 2024-07-02 DIAGNOSIS — E113393 Type 2 diabetes mellitus with moderate nonproliferative diabetic retinopathy without macular edema, bilateral: Secondary | ICD-10-CM | POA: Diagnosis not present

## 2024-07-02 LAB — OPHTHALMOLOGY REPORT-SCANNED

## 2024-07-02 NOTE — Assessment & Plan Note (Signed)
 Saw endocrinology - 07/2023 - recommended f/u thyroid  ultrasound 02/2024. Need to confirm f/u.

## 2024-07-02 NOTE — Telephone Encounter (Signed)
 LM for patient to let her know that I do not see where anyone has called her but if she needs anything to call office back.

## 2024-07-02 NOTE — Assessment & Plan Note (Signed)
 Recently evaluated - bullous lesion - right innter thigh and few scattered lesions right lower leg. Persistent right inner thigh lesion. Will stop triamcinolone  cream. Will have dermatology evaluate. Call with update.

## 2024-07-02 NOTE — Assessment & Plan Note (Signed)
 Recheck cbc.

## 2024-07-02 NOTE — Assessment & Plan Note (Signed)
 Stay hydrated. Avoid antiinflammatories.  Continue losartan . Recent GFR 52. Follow metabolic panel.

## 2024-07-02 NOTE — Assessment & Plan Note (Signed)
 On triam/hydrochlorothiazide  now. Potassium slightly decreased.  On no potassium supplements. Recheck potassium.

## 2024-07-02 NOTE — Assessment & Plan Note (Signed)
 Low carb diet and exercise given elevated blood sugars.  Follow met b and A1c.  Lab Results  Component Value Date   HGBA1C 6.7 (H) 06/18/2024

## 2024-07-02 NOTE — Assessment & Plan Note (Signed)
 Blood pressure as outlined.  Continue 240mg  cardizem . Continues losartan  and triam/hydrochlorothiazide  1/2 tablet per day. Follow pressures. No changes in medication today.

## 2024-07-02 NOTE — Assessment & Plan Note (Signed)
 Continue lipitor.  Low cholesterol diet and exercise.  Follow lipid panel.  Lab Results  Component Value Date   CHOL 174 06/18/2024   HDL 56.70 06/18/2024   LDLCALC 101 (H) 06/18/2024   TRIG 85.0 06/18/2024   CHOLHDL 3 06/18/2024

## 2024-07-02 NOTE — Telephone Encounter (Signed)
 Copied from CRM [id number]. Topic: General - Call Back - No Documentation >> Jul 02, 2024 10:19 AM Christina Cole wrote: Reason for CRM: Patient missed a call she received from the office and returning the call, no recent documentation on file and called [facility]. They advise to send a CRM.

## 2024-07-02 NOTE — Assessment & Plan Note (Signed)
 Continue lipitor  ?

## 2024-07-08 ENCOUNTER — Ambulatory Visit: Admitting: Dermatology

## 2024-07-08 ENCOUNTER — Encounter: Payer: Self-pay | Admitting: Dermatology

## 2024-07-08 DIAGNOSIS — W57XXXA Bitten or stung by nonvenomous insect and other nonvenomous arthropods, initial encounter: Secondary | ICD-10-CM | POA: Diagnosis not present

## 2024-07-08 DIAGNOSIS — L299 Pruritus, unspecified: Secondary | ICD-10-CM

## 2024-07-08 MED ORDER — CLOBETASOL PROPIONATE 0.05 % EX CREA: 1.0000 | TOPICAL_CREAM | Freq: Two times a day (BID) | CUTANEOUS | 1 refills | Status: DC

## 2024-07-08 NOTE — Progress Notes (Signed)
   New Patient Visit   Subjective  Christina Cole is a 83 y.o. female who presents for the following: patient with an itchy spot at right upper thigh. Patient advises she had the same problem in just about the same area 2 years ago and it was lanced. August 18th is when this new spot came up like a little red blister. Patient saw PCP 8/18 and was given TMC 0.1% cr which she is no longer using but it did help. She does have a few smaller spots that have gotten better with the cream.     The following portions of the chart were reviewed this encounter and updated as appropriate: medications, allergies, medical history  Review of Systems:  No other skin or systemic complaints except as noted in HPI or Assessment and Plan.  Objective  Well appearing patient in no apparent distress; mood and affect are within normal limits.   A focused examination was performed of the following areas: legs  Relevant exam findings are noted in the Assessment and Plan.    Assessment & Plan   Bullous arthropod bite reaction Exam: hyperpigmented slightly scaly desquamating oval-shaped macule on R lateral thigh and patch on R medial thigh. edematous pink papule L lower lateral foot.  Treatment Plan: Benign, observe.   Recommend clobetasol  0.05% cr twice daily for up to 1 week prn insect bite/itch. Avoid applying to face, groin, and axilla. Use as directed. Long-term use can cause thinning of the skin.  Recommend [place]yer Picaridin Insect Repellent and wear protective clothing when outdoors.   Topical steroids (such as triamcinolone , fluocinolone, fluocinonide, mometasone, clobetasol , halobetasol, betamethasone, hydrocortisone) can cause thinning and lightening of the skin if they are used for too long in the same area. Your physician has selected the right strength medicine for your problem and area affected on the body. Please use your medication only as directed by your physician to prevent side effects.    If lesions become too numerous to treat, call for appointment and possible biopsy  ARTHROPOD BITE, INITIAL ENCOUNTER    Return if symptoms worsen or fail to improve.  Christina Cole, RMA, am acting as scribe for Boneta Sharps, MD .   Documentation: I have reviewed the above documentation for accuracy and completeness, and I agree with the above.  Boneta Sharps, MD

## 2024-07-08 NOTE — Patient Instructions (Addendum)
 Recommend clobetasol  0.05% cr twice daily for up to 1 week prn insect bite/itch. Avoid applying to face, groin, and axilla. Use as directed. Long-term use can cause thinning of the skin.  Recommend [place]yer Picaridin Insect Repellent and wear protective clothing when outdoors.   Topical steroids (such as triamcinolone , fluocinolone, fluocinonide, mometasone, clobetasol , halobetasol, betamethasone, hydrocortisone) can cause thinning and lightening of the skin if they are used for too long in the same area. Your physician has selected the right strength medicine for your problem and area affected on the body. Please use your medication only as directed by your physician to prevent side effects.     Due to recent changes in healthcare laws, you may see results of your pathology and/or laboratory studies on MyChart before the doctors have had a chance to review them. We understand that in some cases there may be results that are confusing or concerning to you. Please understand that not all results are received at the same time and often the doctors may need to interpret multiple results in order to provide you with the best plan of care or course of treatment. Therefore, we ask that you please give us  2 business days to thoroughly review all your results before contacting the office for clarification. Should we see a critical lab result, you will be contacted sooner.   If You Need Anything After Your Visit  If you have any questions or concerns for your doctor, please call our main line at [phone] and press option 4 to reach your doctor's medical assistant. If no one answers, please leave a voicemail as directed and we will return your call as soon as possible. Messages left after 4 pm will be answered the following business day.   You may also send us  a message via MyChart. We typically respond to MyChart messages within 1-2 business days.  For prescription refills, please ask your pharmacy to  contact our office. Our fax number is [phone].  If you have an urgent issue when the clinic is closed that cannot wait until the next business day, you can page your doctor at the number below.    Please note that while we do our best to be available for urgent issues outside of office hours, we are not available 24/7.   If you have an urgent issue and are unable to reach us , you may choose to seek medical care at your doctor's office, retail clinic, urgent care center, or emergency room.  If you have a medical emergency, please immediately call 911 or go to the emergency department.  Pager Numbers  - Dr. Hester: [phone]  - Dr. Jackquline: [phone]  - Dr. Claudene: [phone]   - Dr. Raymund: [phone]  In the event of inclement weather, please call our main line at [phone] for an update on the status of any delays or closures.  Dermatology Medication Tips: Please keep the boxes that topical medications come in in order to help keep track of the instructions about where and how to use these. Pharmacies typically print the medication instructions only on the boxes and not directly on the medication tubes.   If your medication is too expensive, please contact our office at [phone] option 4 or send us  a message through MyChart.   We are unable to tell what your co-pay for medications will be in advance as this is different depending on your insurance coverage. However, we may be able to find a substitute medication at lower cost  or fill out paperwork to get insurance to cover a needed medication.   If a prior authorization is required to get your medication covered by your insurance company, please allow us  1-2 business days to complete this process.  Drug prices often vary depending on where the prescription is filled and some pharmacies may offer cheaper prices.  The website www.goodrx.com contains coupons for medications through different pharmacies. The prices  here do not account for what the cost may be with help from insurance (it may be cheaper with your insurance), but the website can give you the price if you did not use any insurance.  - You can print the associated coupon and take it with your prescription to the pharmacy.  - You may also stop by our office during regular business hours and pick up a GoodRx coupon card.  - If you need your prescription sent electronically to a different pharmacy, notify our office through [facility] or by phone at [phone] option 4.     Si Usted Necesita Algo Despus de Su Visita  Tambin puede enviarnos un mensaje a travs de Clinical cytogeneticist. Por lo general respondemos a los mensajes de MyChart en el transcurso de 1 a 2 das hbiles.  Para renovar recetas, por favor pida a su farmacia que se ponga en contacto con nuestra oficina. Randi lakes de fax es [place] [phone].  Si tiene un asunto urgente cuando la clnica est cerrada y que no puede esperar hasta el siguiente da hbil, puede llamar/localizar a su doctor(a) al nmero que aparece a continuacin.   Por favor, tenga en cuenta que aunque hacemos todo lo posible para estar disponibles para asuntos urgentes fuera del horario de [place], no estamos disponibles las 24 horas del da, los 7 [address]  [address] de la [place].   Si tiene un problema urgente y no puede comunicarse con nosotros, puede optar por buscar atencin mdica  en el consultorio de su doctor(a), en una clnica privada, en un centro de atencin urgente o en una sala de emergencias.  Si tiene Engineer, drilling, por favor llame inmediatamente al 911 o vaya a la sala de emergencias.  Nmeros de bper  - Dr. Hester: [phone]  - Dra. Jackquline: [phone]  - Dr. Claudene: [phone]  - Dra. Kitts: [phone]  En caso de inclemencias del [place], por favor llame a nuestra lnea principal al [phone] para una actualizacin sobre el estado de cualquier retraso o cierre.  Consejos  para la medicacin en dermatologa: Por favor, guarde las cajas en las que vienen los medicamentos de uso tpico para ayudarle a seguir las instrucciones sobre dnde y cmo usarlos. Las farmacias generalmente imprimen las instrucciones del medicamento slo en las cajas y no directamente en los tubos del [place].   Si su medicamento es muy caro, por favor, pngase en contacto con landry rieger llamando al [phone] y presione la opcin 4 o envenos un mensaje a travs de Clinical cytogeneticist.   No podemos decirle cul ser su copago por los medicamentos por adelantado ya que esto es diferente dependiendo de la cobertura de su seguro. Sin embargo, es posible que podamos encontrar un medicamento sustituto a Audiological scientist un formulario para que el seguro cubra el medicamento que se considera necesario.   Si se requiere una autorizacin previa para que su compaa de seguros malta su medicamento, por favor permtanos de 1 a 2 das hbiles para completar este proceso.  Los precios de los medicamentos varan con frecuencia dependiendo del Environmental consultant  de dnde se surte la receta y alguna farmacias pueden ofrecer precios ms baratos.  El sitio web www.goodrx.com tiene cupones para medicamentos de Health and safety inspector. Los precios aqu no tienen en cuenta lo que podra costar con la ayuda del seguro (puede ser ms barato con su seguro), pero el sitio web puede darle el precio si no utiliz Tourist information centre manager.  - Puede imprimir el cupn correspondiente y llevarlo con su receta a la farmacia.  - Tambin puede pasar por nuestra oficina durante el horario de atencin regular y Education officer, museum una tarjeta de cupones de GoodRx.  - Si necesita que su receta se enve electrnicamente a una farmacia diferente, informe a nuestra oficina a travs de MyChart de [place] o por telfono llamando al [phone] y presione la opcin 4.

## 2024-07-17 DIAGNOSIS — E049 Nontoxic goiter, unspecified: Secondary | ICD-10-CM | POA: Diagnosis not present

## 2024-07-17 DIAGNOSIS — E039 Hypothyroidism, unspecified: Secondary | ICD-10-CM | POA: Diagnosis not present

## 2024-07-24 DIAGNOSIS — E042 Nontoxic multinodular goiter: Secondary | ICD-10-CM | POA: Diagnosis not present

## 2024-07-24 DIAGNOSIS — E039 Hypothyroidism, unspecified: Secondary | ICD-10-CM | POA: Diagnosis not present

## 2024-08-05 ENCOUNTER — Telehealth: Payer: Self-pay

## 2024-08-05 DIAGNOSIS — R17 Unspecified jaundice: Secondary | ICD-10-CM

## 2024-08-05 NOTE — Telephone Encounter (Signed)
 Order for liver panel placed.

## 2024-08-05 NOTE — Addendum Note (Signed)
 Addended by: LEARTA PORTO D on: 08/05/2024 01:35 PM   Modules accepted: Orders

## 2024-08-05 NOTE — Telephone Encounter (Signed)
 Copied from CRM [id number]. Topic: Clinical - Request for Lab/Test Order >> Aug 05, 2024  9:57 AM [place] J wrote: Reason for CRM: Patient has an appointment with the lab to check her liver on the 10th. She does not have any active lab orders and she was wondering if an order from Dr. Glendia could be placed.

## 2024-08-09 ENCOUNTER — Other Ambulatory Visit

## 2024-08-09 NOTE — Addendum Note (Signed)
 Addended by: MARYLEN PRO A on: 08/09/2024 08:20 AM   Modules accepted: Orders

## 2024-08-12 ENCOUNTER — Other Ambulatory Visit: Payer: Self-pay | Admitting: Internal Medicine

## 2024-08-12 DIAGNOSIS — Z1231 Encounter for screening mammogram for malignant neoplasm of breast: Secondary | ICD-10-CM

## 2024-09-07 ENCOUNTER — Other Ambulatory Visit: Payer: Self-pay

## 2024-09-07 DIAGNOSIS — Z5321 Procedure and treatment not carried out due to patient leaving prior to being seen by health care provider: Secondary | ICD-10-CM | POA: Insufficient documentation

## 2024-09-07 DIAGNOSIS — R519 Headache, unspecified: Secondary | ICD-10-CM | POA: Diagnosis not present

## 2024-09-07 DIAGNOSIS — R21 Rash and other nonspecific skin eruption: Secondary | ICD-10-CM | POA: Diagnosis not present

## 2024-09-07 NOTE — ED Triage Notes (Addendum)
 POV with CC of scattered itchy rashes to L flank and R lower extremity since 10pm tonight. Also reports slight headache. Hx of same with unknown origin. Denies known allergies. Denies changes in voice, difficulty speaking, and facial swelling. A&Ox4 at this time.

## 2024-09-08 ENCOUNTER — Emergency Department
Admission: EM | Admit: 2024-09-08 | Discharge: 2024-09-08 | Discharge status: Against Medical Advice | Attending: Emergency Medicine | Admitting: Emergency Medicine

## 2024-09-09 ENCOUNTER — Ambulatory Visit: Payer: Self-pay

## 2024-09-09 NOTE — Telephone Encounter (Signed)
 Duplicate entry. Triaged earlier.

## 2024-09-09 NOTE — Telephone Encounter (Signed)
 Please call her and confirm no lip or tongue swelling. No sob. If any acute symptoms, needs to be evaluated today. If no acute symptoms, I can see her tomorrow at 4:00.

## 2024-09-09 NOTE — Telephone Encounter (Signed)
 FYI Only or Action Required?: Action required by provider: request for appointment.  Patient was last seen in primary care on 06/26/2024 by Glendia Shad, MD.  Called Nurse Triage reporting Urticaria.  Symptoms began several days ago.  Interventions attempted: Nothing.  Symptoms are: gradually worsening.  Triage Disposition: See PCP When Office is Open (Within 3 Days)  Patient/caregiver understands and will follow disposition?: Yes  Reason for Disposition  [1] Widespread hives, itching or face swelling AND [2] onset > 2 hours after exposure to high-risk allergen (e.g., sting, nuts, 1st dose of antibiotic)  Answer Assessment - Initial Assessment Questions 1. APPEARANCE: What does the rash look like?      Small, reddened, bumps  2. LOCATION: Where is the rash located?      Generalized  3. NUMBER: How many hives are there?      Unable to count  4. SIZE: How big are the hives? (e.g., inches, cm, compare to coins) Do they all look the same or do they vary in shape and size?      Small  5. ONSET: When did the hives begin? (e.g., hours or days ago)      Chronic, Worsening for the past several days  6. ITCHING: Does it itch? If Yes, ask: How bad is the itch?  (e.g., none, mild, moderate, severe)     Yes, Mild to Moderate  7. RECURRENT PROBLEM: Have you had hives before? If Yes, ask: When was the last time? and What happened that time?      Yes  8. TRIGGERS: Were you exposed to any new food, plant, cosmetic product or animal just before the hives began?     Medication, Cholesterol  9. OTHER SYMPTOMS: Do you have any other symptoms? (e.g., fever, tongue swelling, difficulty breathing, abdomen pain)     Hand swelling  10. PREGNANCY: Is there any chance you are pregnant? When was your last menstrual period?       No and No  Protocols used: Hives-A-AH

## 2024-09-10 ENCOUNTER — Encounter: Payer: Self-pay | Admitting: Internal Medicine

## 2024-09-10 ENCOUNTER — Ambulatory Visit: Admitting: Internal Medicine

## 2024-09-10 VITALS — BP 136/80 | HR 57 | Temp 98.1°F | Ht 63.0 in | Wt 136.0 lb

## 2024-09-10 DIAGNOSIS — L509 Urticaria, unspecified: Secondary | ICD-10-CM

## 2024-09-10 DIAGNOSIS — E079 Disorder of thyroid, unspecified: Secondary | ICD-10-CM

## 2024-09-10 DIAGNOSIS — E1165 Type 2 diabetes mellitus with hyperglycemia: Secondary | ICD-10-CM | POA: Diagnosis not present

## 2024-09-10 NOTE — Progress Notes (Signed)
 Subjective:    Patient ID: Christina Cole, female    DOB: [birth year], 83 y.o.   MRN: [id number]  Patient here for  Chief Complaint  Patient presents with   Urticaria    HPI Here for a work in appt - work in with concerns regarding hives. She reports that has had intermittent issues over the past several years. Noticed a few days ago - burning, itching - spot under breast and then neck and right leg and lower abdomen to groin. Reports - hives. Is better now. No lesions. No rash. No fever. No known triggers. No new exposures or contacts. Eating and drinking well. Recently saw endocrinology - stopped synthroid.    Past Medical History:  Diagnosis Date   Heart murmur    Hyperlipidemia    Hypertension    Thyroid  disease    goiter (follows with Dr. Christi)   Past Surgical History:  Procedure Laterality Date   ABDOMINAL HYSTERECTOMY  1978   ovaries left in place   APPENDECTOMY  1978   BREAST BIOPSY Right 09/29/2017   Pt had Bx  done in [facility] office [place] @ 3:00 per chart-FIBROCYSTIC CHANGES.   COLONOSCOPY WITH PROPOFOL  N/A 07/11/2017   Procedure: COLONOSCOPY WITH PROPOFOL ;  Surgeon: Jinny Carmine, MD;  Location: [facility] ENDOSCOPY;  Service: Endoscopy;  Laterality: N/A;   TUBAL LIGATION     Family History  Problem Relation Age of Onset   Stroke Mother    Hypertension Mother    Arthritis Mother    Breast cancer Sister 55   Breast cancer Sister 26   Lung cancer Brother    Diabetes Brother    Social History   Socioeconomic History   Marital status: Single    Spouse name: Not on file   Number of children: 3   Years of education: Not on file   Highest education level: Not on file  Occupational History   Not on file  Tobacco Use   Smoking status: Former   Smokeless tobacco: Never  Vaping Use   Vaping status: Never Used  Substance and Sexual Activity   Alcohol use: No    Alcohol/week: 0.0 standard drinks of alcohol   Drug use: No   Sexual activity: Not on file   Other Topics Concern   Not on file  Social History Narrative   Twin sister died 11-28-2021 and her brother died 11-28-2021    Social Drivers of Health   Financial Resource Strain: Low Risk  (06/12/2024)   Overall Financial Resource Strain (CARDIA)    Difficulty of Paying Living Expenses: Not hard at all  Food Insecurity: No Food Insecurity (06/12/2024)   Hunger Vital Sign    Worried About Running Out of Food in the Last Year: Never true    Ran Out of Food in the Last Year: Never true  Transportation Needs: No Transportation Needs (06/12/2024)   PRAPARE - Administrator, Civil Service (Medical): No    Lack of Transportation (Non-Medical): No  Physical Activity: Inactive (06/12/2024)   Exercise Vital Sign    Days of Exercise per Week: 0 days    Minutes of Exercise per Session: 0 min  Stress: No Stress Concern Present (06/12/2024)   [employer] of Occupational Health - Occupational Stress Questionnaire    Feeling of Stress: Not at all  Social Connections: Moderately Integrated (06/12/2024)   Social Connection and Isolation Panel    Frequency of Communication with Friends and Family: More than three times a week  Frequency of Social Gatherings with Friends and Family: More than three times a week    Attends Religious Services: More than 4 times per year    Active Member of Clubs or Organizations: Yes    Attends Engineer, Structural: More than 4 times per year    Marital Status: Never married     Review of Systems  Constitutional:  Negative for appetite change and fever.  HENT:  Negative for congestion and sinus pressure.   Respiratory:  Negative for cough, chest tightness and shortness of breath.   Cardiovascular:  Negative for chest pain, palpitations and leg swelling.  Gastrointestinal:  Negative for abdominal pain, diarrhea, nausea and vomiting.  Genitourinary:  Negative for difficulty urinating and dysuria.  Musculoskeletal:  Negative for joint swelling and  myalgias.  Skin:        No rash now.   Neurological:  Negative for dizziness and headaches.  Psychiatric/Behavioral:  Negative for agitation and dysphoric mood.        Objective:     BP 136/80   Pulse (!) 57   Temp 98.1 F (36.7 C) (Oral)   Ht 5' 3 (1.6 m)   Wt 136 lb (61.7 kg)   LMP 12/21/1976   SpO2 95%   BMI 24.09 kg/m  Wt Readings from Last 3 Encounters:  09/10/24 136 lb (61.7 kg)  09/07/24 137 lb (62.1 kg)  06/26/24 138 lb 9.6 oz (62.9 kg)    Physical Exam Vitals reviewed.  Constitutional:      General: She is not in acute distress.    Appearance: Normal appearance.  HENT:     Head: Normocephalic and atraumatic.     Right Ear: External ear normal.     Left Ear: External ear normal.     Mouth/Throat:     Pharynx: No oropharyngeal exudate or posterior oropharyngeal erythema.  Eyes:     General: No scleral icterus.       Right eye: No discharge.        Left eye: No discharge.     Conjunctiva/sclera: Conjunctivae normal.  Neck:     Thyroid : No thyromegaly.  Cardiovascular:     Rate and Rhythm: Normal rate and regular rhythm.  Pulmonary:     Effort: No respiratory distress.     Breath sounds: Normal breath sounds. No wheezing.  Abdominal:     General: Bowel sounds are normal.     Palpations: Abdomen is soft.     Tenderness: There is no abdominal tenderness.  Musculoskeletal:        General: No swelling or tenderness.     Cervical back: Neck supple. No tenderness.  Lymphadenopathy:     Cervical: No cervical adenopathy.  Skin:    Findings: No erythema or rash.  Neurological:     Mental Status: She is alert.  Psychiatric:        Mood and Affect: Mood normal.        Behavior: Behavior normal.         Outpatient Encounter Medications as of 09/10/2024  Medication Sig   atorvastatin  (LIPITOR) 40 MG tablet Take 1 tablet (40 mg total) by mouth daily.   diltiazem  (CARDIZEM  CD) 240 MG 24 hr capsule Take 1 capsule (240 mg total) by mouth daily.    EPINEPHrine  0.3 mg/0.3 mL IJ SOAJ injection USE AS DIRECTED   losartan  (COZAAR ) 100 MG tablet Take 1 tablet (100 mg total) by mouth daily.   triamterene -hydrochlorothiazide  (MAXZIDE-25) 37.5-25 MG tablet Take 1/2  tablet by mouth daily.   pantoprazole  (PROTONIX ) 40 MG tablet Take 1 tablet (40 mg total) by mouth daily as needed.   [DISCONTINUED] clobetasol  cream (TEMOVATE ) 0.05 % Apply 1 Application topically 2 (two) times daily. Twice daily for up to 1 week prn itch for insect bites. Avoid applying to face, groin, and axilla. Use as directed. Long-term use can cause thinning of the skin.   [DISCONTINUED] levothyroxine (SYNTHROID, LEVOTHROID) 25 MCG tablet Take 25 mcg by mouth daily before breakfast. (Patient not taking: Reported on 09/10/2024)   [DISCONTINUED] pantoprazole  (PROTONIX ) 40 MG tablet Take 1 tablet (40 mg total) by mouth daily.   [DISCONTINUED] triamcinolone  cream (KENALOG ) 0.1 % Apply 1 Application topically 2 (two) times daily.   No facility-administered encounter medications on file as of 09/10/2024.     Lab Results  Component Value Date   WBC 5.5 10/11/2023   HGB 12.1 10/11/2023   HCT 37.7 10/11/2023   PLT 196.0 10/11/2023   GLUCOSE 96 06/18/2024   CHOL 174 06/18/2024   TRIG 85.0 06/18/2024   HDL 56.70 06/18/2024   LDLCALC 101 (H) 06/18/2024   ALT 14 06/26/2024   AST 17 06/26/2024   NA 136 06/18/2024   K 4.0 06/26/2024   CL 101 06/18/2024   CREATININE 1.01 06/18/2024   BUN 13 06/18/2024   CO2 26 06/18/2024   TSH 2.12 03/17/2023   HGBA1C 6.7 (H) 06/18/2024       Assessment & Plan:  Thyroid  disease Assessment & Plan: Off synthroid now. Followed by endocrinology. Planning for f/u tsh.    Type 2 diabetes mellitus with hyperglycemia, without long-term current use of insulin (HCC) Assessment & Plan: Low carb diet and exercise. Follow met b and A1c.  Lab Results  Component Value Date   HGBA1C 6.7 (H) 06/18/2024     Hives Assessment & Plan: Reported  itching. ? Hives. No rash now. No known triggers. Only involved limited spots as outlined. Discussed referral to allergist. Wants to monitor. Notify if persistent problems or recurring issues. Follow for triggers.    Other orders -     Pantoprazole  Sodium; Take 1 tablet (40 mg total) by mouth daily as needed.  Dispense: 90 tablet; Refill: 1     Allena Hamilton, MD

## 2024-09-12 ENCOUNTER — Ambulatory Visit: Admitting: Nurse Practitioner

## 2024-09-16 ENCOUNTER — Encounter: Payer: Self-pay | Admitting: Internal Medicine

## 2024-09-16 ENCOUNTER — Ambulatory Visit
Admission: RE | Admit: 2024-09-16 | Discharge: 2024-09-16 | Disposition: A | Discharge status: Home / Self Care | Source: Ambulatory Visit | Attending: Internal Medicine | Admitting: Internal Medicine

## 2024-09-16 DIAGNOSIS — Z1231 Encounter for screening mammogram for malignant neoplasm of breast: Secondary | ICD-10-CM | POA: Insufficient documentation

## 2024-09-16 DIAGNOSIS — L509 Urticaria, unspecified: Secondary | ICD-10-CM | POA: Insufficient documentation

## 2024-09-16 MED ORDER — PANTOPRAZOLE SODIUM 40 MG PO TBEC: 40.0000 mg | DELAYED_RELEASE_TABLET | Freq: Every day | ORAL | 1 refills | Status: DC | PRN

## 2024-09-16 NOTE — Assessment & Plan Note (Signed)
 Reported itching. ? Hives. No rash now. No known triggers. Only involved limited spots as outlined. Discussed referral to allergist. Wants to monitor. Notify if persistent problems or recurring issues. Follow for triggers.

## 2024-09-16 NOTE — Assessment & Plan Note (Signed)
 Low carb diet and exercise. Follow met b and A1c.  Lab Results  Component Value Date   HGBA1C 6.7 (H) 06/18/2024

## 2024-09-16 NOTE — Assessment & Plan Note (Signed)
 Off synthroid now. Followed by endocrinology. Planning for f/u tsh.

## 2024-09-19 NOTE — Telephone Encounter (Signed)
 open in error

## 2024-10-26 ENCOUNTER — Other Ambulatory Visit: Payer: Self-pay

## 2024-10-26 ENCOUNTER — Encounter: Payer: Self-pay | Admitting: Emergency Medicine

## 2024-10-26 ENCOUNTER — Emergency Department
Admission: EM | Admit: 2024-10-26 | Discharge: 2024-10-26 | Disposition: A | Discharge status: Home / Self Care | Attending: Emergency Medicine | Admitting: Emergency Medicine

## 2024-10-26 DIAGNOSIS — I10 Essential (primary) hypertension: Secondary | ICD-10-CM | POA: Diagnosis not present

## 2024-10-26 DIAGNOSIS — L509 Urticaria, unspecified: Secondary | ICD-10-CM | POA: Diagnosis present

## 2024-10-26 MED ORDER — LORATADINE 10 MG PO TABS
10.0000 mg | ORAL_TABLET | Freq: Once | ORAL | Status: AC
  Administered 2024-10-26: 10 mg via ORAL
  Filled 2024-10-26: qty 1

## 2024-10-26 NOTE — ED Provider Notes (Signed)
 "  [facility] Provider Note    Event Date/Time   First MD Initiated Contact with Patient 10/26/24 0404     (approximate)   History   Urticaria   HPI Christina Cole is a 83 y.o. female who presents for evaluation of hives that she developed while watching TV.  She had some on the front of her chest and on each of her hips.  At no point was she short of breath and has not had any abdominal pain or diarrhea or lightheadedness or dizziness.  She has an EpiPen  to use in the case of anaphylaxis but did not use it.  She did not take any medications and came to the emergency department where she said the hives are almost completely resolved.     Physical Exam   Triage Vital Signs: ED Triage Vitals  Encounter Vitals Group     BP 10/26/24 0037 (!) 191/75     Girls Systolic BP Percentile --      Girls Diastolic BP Percentile --      Boys Systolic BP Percentile --      Boys Diastolic BP Percentile --      Pulse Rate 10/26/24 0037 88     Resp --      Temp 10/26/24 0037 97.8 F (36.6 C)     Temp Source 10/26/24 0037 Oral     SpO2 10/26/24 0037 100 %     Weight 10/26/24 0038 62.1 kg (137 lb)     Height 10/26/24 0038 1.6 m (5' 3)     Head Circumference --      Peak Flow --      Pain Score 10/26/24 0046 0     Pain Loc --      Pain Education --      Exclude from Growth Chart --     Most recent vital signs: Vitals:   10/26/24 0037 10/26/24 0450  BP: (!) 191/75 (!) 204/88  Pulse: 88 76  Temp: 97.8 F (36.6 C)   SpO2: 100% 100%    General: Awake, no distress.  Pleasant, conversant, well-appearing and in no distress. CV:  Good peripheral perfusion.  Resp:  Normal effort. Speaking easily and comfortably, no accessory muscle usage nor intercostal retractions.   Abd:  No distention.  Other:  Scattered urticaria on both her left and right hips.  No evidence of infection.  No vesicular lesions.   ED Results / Procedures / Treatments   Labs (all labs  ordered are listed, but only abnormal results are displayed) Labs Reviewed - No data to display      PROCEDURES:  Critical Care performed: No  Procedures    IMPRESSION / MDM / ASSESSMENT AND PLAN / ED COURSE  I reviewed the triage vital signs and the nursing notes.                              Differential diagnosis includes, but is not limited to, allergic reaction, anaphylaxis, shingles  Patient's presentation is most consistent with acute, uncomplicated illness.   Interventions/Medications given:  Medications  loratadine  (CLARITIN ) tablet 10 mg (10 mg Oral Given 10/26/24 0447)    (Note:  hospital course my include additional interventions and/or labs/studies not listed above.)   Blood pressure is out of control but the patient is asymptomatic from this.  She has a PCP and takes diltiazem .  The blood pressure is likely situational and  she can follow-up with her regular doctor and does not need emergent intervention.  Hives are minimal and already resolving.  No evidence of anaphylaxis.  I gave her a dose of loratadine  in the emergency department and talked with her about the use of lidocaine  or cetirizine at home as well as over-the-counter topical ointment such as hydrocortisone or Benadryl .  She understands and agrees with the plan and will follow-up with her PCP.       FINAL CLINICAL IMPRESSION(S) / ED DIAGNOSES   Final diagnoses:  Urticaria  Uncontrolled hypertension     Rx / DC Orders   ED Discharge Orders     None        Note:  This document was prepared using Dragon voice recognition software and may include unintentional dictation errors.   Gordan Huxley, MD 10/26/24 [phone]  "

## 2024-10-26 NOTE — Discharge Instructions (Addendum)
 Consider taking a daily allergy medicine such as Claritin  or Zyrtec, or at least take 1 when you start to have symptoms.  You can also buy a tube of over-the-counter hydrocortisone ointment and/or Benadryl  cream and apply a thin layer of each 1 or both of them to the hives when they break out.  If you develop shortness of breath or other symptoms that are concerning to you, you can use your EpiPen  but then remember to come directly to the emergency department.  Your blood pressure was very high tonight, but this is likely due to the circumstances.  It is not causing any of your symptoms, so we encourage you to follow-up at the next available opportunity with your primary care provider to see how your blood pressure is doing and if you need any additional medication to help control it.

## 2024-10-26 NOTE — ED Triage Notes (Addendum)
 Pt arrived via POV with reports of allergic reaction, pt has large hives on legs, chest, abdomen and arms. Pt c/o itching, unsure of what is causing the reaction, states she has had it before. States she was sitting watching TV when the rash started.   Denies any shortness of breath or difficulty breathing no scratchy throat.

## 2024-11-05 ENCOUNTER — Other Ambulatory Visit

## 2024-11-05 DIAGNOSIS — E7800 Pure hypercholesterolemia, unspecified: Secondary | ICD-10-CM

## 2024-11-05 DIAGNOSIS — D649 Anemia, unspecified: Secondary | ICD-10-CM | POA: Diagnosis not present

## 2024-11-05 DIAGNOSIS — E1165 Type 2 diabetes mellitus with hyperglycemia: Secondary | ICD-10-CM | POA: Diagnosis not present

## 2024-11-05 LAB — BASIC METABOLIC PANEL WITH GFR
BUN: 17 mg/dL (ref 6–23)
CO2: 30 meq/L (ref 19–32)
Calcium: 9.2 mg/dL (ref 8.4–10.5)
Chloride: 103 meq/L (ref 96–112)
Creatinine, Ser: 1.06 mg/dL (ref 0.40–1.20)
GFR: 49.28 mL/min — ABNORMAL LOW
Glucose, Bld: 89 mg/dL (ref 70–99)
Potassium: 3.6 meq/L (ref 3.5–5.1)
Sodium: 138 meq/L (ref 135–145)

## 2024-11-05 LAB — HEPATIC FUNCTION PANEL
ALT: 12 U/L (ref 3–35)
AST: 13 U/L (ref 5–37)
Albumin: 4.2 g/dL (ref 3.5–5.2)
Alkaline Phosphatase: 57 U/L (ref 39–117)
Bilirubin, Direct: 0.2 mg/dL (ref 0.1–0.3)
Total Bilirubin: 1.1 mg/dL (ref 0.2–1.2)
Total Protein: 7.1 g/dL (ref 6.0–8.3)

## 2024-11-05 LAB — CBC WITH DIFFERENTIAL/PLATELET
Basophils Absolute: 0 K/uL (ref 0.0–0.1)
Basophils Relative: 0.9 % (ref 0.0–3.0)
Eosinophils Absolute: 0.2 K/uL (ref 0.0–0.7)
Eosinophils Relative: 5.2 % — ABNORMAL HIGH (ref 0.0–5.0)
HCT: 38 % (ref 36.0–46.0)
Hemoglobin: 12.7 g/dL (ref 12.0–15.0)
Lymphocytes Relative: 41.3 % (ref 12.0–46.0)
Lymphs Abs: 1.9 K/uL (ref 0.7–4.0)
MCHC: 33.6 g/dL (ref 30.0–36.0)
MCV: 86.5 fl (ref 78.0–100.0)
Monocytes Absolute: 0.4 K/uL (ref 0.1–1.0)
Monocytes Relative: 8.1 % (ref 3.0–12.0)
Neutro Abs: 2.1 K/uL (ref 1.4–7.7)
Neutrophils Relative %: 44.5 % (ref 43.0–77.0)
Platelets: 182 K/uL (ref 150.0–400.0)
RBC: 4.39 Mil/uL (ref 3.87–5.11)
RDW: 14.9 % (ref 11.5–15.5)
WBC: 4.7 K/uL (ref 4.0–10.5)

## 2024-11-05 LAB — MICROALBUMIN / CREATININE URINE RATIO
Creatinine,U: 74.9 mg/dL
Microalb Creat Ratio: UNDETERMINED mg/g (ref 0.0–30.0)
Microalb, Ur: <0.7 mg/dL

## 2024-11-05 LAB — LIPID PANEL
Cholesterol: 172 mg/dL (ref 28–200)
HDL: 53.7 mg/dL
LDL Cholesterol: 99 mg/dL (ref 10–99)
NonHDL: 118.25
Total CHOL/HDL Ratio: 3
Triglycerides: 95 mg/dL (ref 10.0–149.0)
VLDL: 19 mg/dL (ref 0.0–40.0)

## 2024-11-05 LAB — HEMOGLOBIN A1C: Hgb A1c MFr Bld: 6.5 % (ref 4.6–6.5)

## 2024-11-07 ENCOUNTER — Ambulatory Visit (INDEPENDENT_AMBULATORY_CARE_PROVIDER_SITE_OTHER): Admitting: Internal Medicine

## 2024-11-07 ENCOUNTER — Encounter: Payer: Self-pay | Admitting: Internal Medicine

## 2024-11-07 VITALS — BP 120/70 | HR 59 | Temp 98.2°F | Ht 63.0 in | Wt 137.0 lb

## 2024-11-07 DIAGNOSIS — E1122 Type 2 diabetes mellitus with diabetic chronic kidney disease: Secondary | ICD-10-CM | POA: Diagnosis not present

## 2024-11-07 DIAGNOSIS — L509 Urticaria, unspecified: Secondary | ICD-10-CM

## 2024-11-07 DIAGNOSIS — E1165 Type 2 diabetes mellitus with hyperglycemia: Secondary | ICD-10-CM

## 2024-11-07 DIAGNOSIS — T7840XD Allergy, unspecified, subsequent encounter: Secondary | ICD-10-CM | POA: Diagnosis not present

## 2024-11-07 DIAGNOSIS — E7800 Pure hypercholesterolemia, unspecified: Secondary | ICD-10-CM

## 2024-11-07 DIAGNOSIS — E079 Disorder of thyroid, unspecified: Secondary | ICD-10-CM | POA: Diagnosis not present

## 2024-11-07 DIAGNOSIS — Z83719 Family history of colon polyps, unspecified: Secondary | ICD-10-CM

## 2024-11-07 DIAGNOSIS — I10 Essential (primary) hypertension: Secondary | ICD-10-CM

## 2024-11-07 DIAGNOSIS — N1831 Chronic kidney disease, stage 3a: Secondary | ICD-10-CM | POA: Diagnosis not present

## 2024-11-07 LAB — HM DIABETES FOOT EXAM

## 2024-11-07 MED ORDER — LOSARTAN POTASSIUM 100 MG PO TABS: 100.0000 mg | ORAL_TABLET | Freq: Every day | ORAL | 3 refills | Status: AC

## 2024-11-07 MED ORDER — PANTOPRAZOLE SODIUM 40 MG PO TBEC: 40.0000 mg | DELAYED_RELEASE_TABLET | Freq: Every day | ORAL | 1 refills | Status: AC | PRN

## 2024-11-07 MED ORDER — TRIAMTERENE-HCTZ 37.5-25 MG PO TABS: ORAL_TABLET | ORAL | 1 refills | Status: AC

## 2024-11-07 NOTE — Progress Notes (Unsigned)
 "  Subjective:    Patient ID: Christina Cole, female    DOB: June 27, 1941, 84 y.o.   MRN: [id number]  Patient here for scheduled follow up.   HPI Here for a scheduled follow up - follow up regarding hypertension and hypercholesterolemia. Saw endocrinology 10/28/24 - off synthroid. F/u thyroid  nodules - no f/u ultrasound recommended. No need for FNA. Will follow thyroid  function. Stays active. No chest pain or sob reported. Does report persistent intermittent hives. Involves various body parts. No known cause. Increased itching when occurs. Recent ER evaluation for this reviewed. Request to see allergist. No lip or tongue swelling. No flares since ER visit. No bowel change. Reviewed blood pressures  - most averaging 120s/60s.    Past Medical History:  Diagnosis Date   Heart murmur    Hyperlipidemia    Hypertension    Thyroid  disease    goiter (follows with Dr. Christi)   Past Surgical History:  Procedure Laterality Date   ABDOMINAL HYSTERECTOMY  1978   ovaries left in place   APPENDECTOMY  25-Nov-1976   BREAST BIOPSY Right 09/29/2017   Pt had Bx  done in [facility] @ 3:00 per chart-FIBROCYSTIC CHANGES.   COLONOSCOPY WITH PROPOFOL  N/A 07/11/2017   Procedure: COLONOSCOPY WITH PROPOFOL ;  Surgeon: Jinny Carmine, MD;  Location: [facility] ENDOSCOPY;  Service: Endoscopy;  Laterality: N/A;   TUBAL LIGATION     Family History  Problem Relation Age of Onset   Stroke Mother    Hypertension Mother    Arthritis Mother    Breast cancer Sister 83   Breast cancer Sister 48   Lung cancer Brother    Diabetes Brother    Social History   Socioeconomic History   Marital status: Single    Spouse name: Not on file   Number of children: 3   Years of education: Not on file   Highest education level: Not on file  Occupational History   Not on file  Tobacco Use   Smoking status: Former   Smokeless tobacco: Never  Vaping Use   Vaping status: Never Used  Substance and Sexual Activity    Alcohol use: No    Alcohol/week: 0.0 standard drinks of alcohol   Drug use: No   Sexual activity: Not on file  Other Topics Concern   Not on file  Social History Narrative   Twin sister died 25-Nov-2021 and her brother died 25-Nov-2021    Social Drivers of Health   Tobacco Use: Medium Risk (11/10/2024)   Patient History    Smoking Tobacco Use: Former    Smokeless Tobacco Use: Never    Passive Exposure: Not on Actuary Strain: Low Risk (06/12/2024)   Overall Financial Resource Strain (CARDIA)    Difficulty of Paying Living Expenses: Not hard at all  Food Insecurity: No Food Insecurity (06/12/2024)   Epic    Worried About Programme Researcher, Broadcasting/film/video in the Last Year: Never true    Ran Out of Food in the Last Year: Never true  Transportation Needs: No Transportation Needs (06/12/2024)   Epic    Lack of Transportation (Medical): No    Lack of Transportation (Non-Medical): No  Physical Activity: Inactive (06/12/2024)   Exercise Vital Sign    Days of Exercise per Week: 0 days    Minutes of Exercise per Session: 0 min  Stress: No Stress Concern Present (06/12/2024)   [employer] of Occupational Health - Occupational Stress Questionnaire    Feeling of Stress:  Not at all  Social Connections: Moderately Integrated (06/12/2024)   Social Connection and Isolation Panel    Frequency of Communication with Friends and Family: More than three times a week    Frequency of Social Gatherings with Friends and Family: More than three times a week    Attends Religious Services: More than 4 times per year    Active Member of Clubs or Organizations: Yes    Attends Banker Meetings: More than 4 times per year    Marital Status: Never married  Depression (PHQ2-9): Low Risk (06/17/2024)   Depression (PHQ2-9)    PHQ-2 Score: 0  Alcohol Screen: Low Risk (06/12/2024)   Alcohol Screen    Last Alcohol Screening Score (AUDIT): 0  Housing: Unknown (10/28/2024)   Received from [facility]   Epic    In the last 12 months, was there a time when you were not able to pay the mortgage or rent on time?: No    Number of Times Moved in the Last Year: Not on file    At Christina time in the past 12 months, were you homeless or living in a shelter (including now)?: No  Utilities: Not At Risk (06/12/2024)   Epic    Threatened with loss of utilities: No  Health Literacy: Adequate Health Literacy (06/12/2024)   B1300 Health Literacy    Frequency of need for help with medical instructions: Never     Review of Systems  Constitutional:  Negative for appetite change and unexpected weight change.  HENT:  Negative for sinus pressure.        Minimal nasal congestion. Discussed saline nasal spray.   Respiratory:  Negative for cough, chest tightness and shortness of breath.   Cardiovascular:  Negative for chest pain, palpitations and leg swelling.  Gastrointestinal:  Negative for abdominal pain, diarrhea, nausea and vomiting.  Genitourinary:  Negative for difficulty urinating and dysuria.  Musculoskeletal:  Negative for joint swelling and myalgias.  Skin:  Negative for color change and rash.  Neurological:  Negative for dizziness and headaches.  Psychiatric/Behavioral:  Negative for agitation and dysphoric mood.        Objective:     BP 120/70   Pulse (!) 59   Temp 98.2 F (36.8 C) (Oral)   Ht 5' 3 (1.6 m)   Wt 137 lb (62.1 kg)   LMP 12/21/1976   SpO2 98%   BMI 24.27 kg/m  Wt Readings from Last 3 Encounters:  11/07/24 137 lb (62.1 kg)  10/26/24 137 lb (62.1 kg)  09/10/24 136 lb (61.7 kg)    Physical Exam Vitals reviewed.  Constitutional:      General: She is not in acute distress.    Appearance: Normal appearance.  HENT:     Head: Normocephalic and atraumatic.     Right Ear: External ear normal.     Left Ear: External ear normal.     Mouth/Throat:     Pharynx: No oropharyngeal exudate or posterior oropharyngeal erythema.  Eyes:     General: No scleral  icterus.       Right eye: No discharge.        Left eye: No discharge.     Conjunctiva/sclera: Conjunctivae normal.  Neck:     Thyroid : No thyromegaly.  Cardiovascular:     Rate and Rhythm: Normal rate and regular rhythm.  Pulmonary:     Effort: No respiratory distress.     Breath sounds: Normal breath sounds. No wheezing.  Abdominal:     General: Bowel sounds are normal.     Palpations: Abdomen is soft.     Tenderness: There is no abdominal tenderness.  Musculoskeletal:        General: No swelling or tenderness.     Cervical back: Neck supple. No tenderness.  Lymphadenopathy:     Cervical: No cervical adenopathy.  Skin:    Findings: No erythema or rash.  Neurological:     Mental Status: She is alert.  Psychiatric:        Mood and Affect: Mood normal.        Behavior: Behavior normal.         Outpatient Encounter Medications as of 11/07/2024  Medication Sig   atorvastatin  (LIPITOR) 40 MG tablet Take 1 tablet (40 mg total) by mouth daily.   diltiazem  (CARDIZEM  CD) 240 MG 24 hr capsule Take 1 capsule (240 mg total) by mouth daily.   EPINEPHrine  0.3 mg/0.3 mL IJ SOAJ injection USE AS DIRECTED   losartan  (COZAAR ) 100 MG tablet Take 1 tablet (100 mg total) by mouth daily.   pantoprazole  (PROTONIX ) 40 MG tablet Take 1 tablet (40 mg total) by mouth daily as needed.   triamterene -hydrochlorothiazide  (MAXZIDE-25) 37.5-25 MG tablet Take 1/2 tablet by mouth daily.   [DISCONTINUED] losartan  (COZAAR ) 100 MG tablet Take 1 tablet (100 mg total) by mouth daily.   [DISCONTINUED] pantoprazole  (PROTONIX ) 40 MG tablet Take 1 tablet (40 mg total) by mouth daily as needed.   [DISCONTINUED] triamterene -hydrochlorothiazide  (MAXZIDE-25) 37.5-25 MG tablet Take 1/2 tablet by mouth daily.   No facility-administered encounter medications on file as of 11/07/2024.     Lab Results  Component Value Date   WBC 4.7 11/05/2024   HGB 12.7 11/05/2024   HCT 38.0 11/05/2024   PLT 182.0 11/05/2024    GLUCOSE 89 11/05/2024   CHOL 172 11/05/2024   TRIG 95.0 11/05/2024   HDL 53.70 11/05/2024   LDLCALC 99 11/05/2024   ALT 12 11/05/2024   AST 13 11/05/2024   NA 138 11/05/2024   K 3.6 11/05/2024   CL 103 11/05/2024   CREATININE 1.06 11/05/2024   BUN 17 11/05/2024   CO2 30 11/05/2024   TSH 2.12 03/17/2023   HGBA1C 6.5 11/05/2024   MICROALBUR <0.7 11/05/2024       Assessment & Plan:  Hypercholesterolemia Assessment & Plan: Continue lipitor.  Low cholesterol diet and exercise.  Follow lipid panel.  Lab Results  Component Value Date   CHOL 172 11/05/2024   HDL 53.70 11/05/2024   LDLCALC 99 11/05/2024   TRIG 95.0 11/05/2024   CHOLHDL 3 11/05/2024     Orders: -     Basic metabolic panel with GFR; Future -     Hepatic function panel; Future -     Lipid panel; Future  Type 2 diabetes mellitus with hyperglycemia, without long-term current use of insulin (HCC) Assessment & Plan: Low carb diet and exercise. Follow met b and A1c.  Lab Results  Component Value Date   HGBA1C 6.5 11/05/2024    Orders: -     Hemoglobin A1c; Future  Thyroid  disease Assessment & Plan: Off synthroid now. Followed by endocrinology. Follow tsh.    Hives Assessment & Plan: Recurring rash/hives as outlined. Recent ER evaluation. Request referral to allergist for further evaluation - to see if can determine etiology of recurring issues.   Orders: -     Ambulatory referral to Allergy  Family history of polyps in the colon Assessment & Plan:  Had colonoscopy 07/2017.  Recommended f/u in 5 years.  She discussed with GI.  Recommended no further f/u colonoscopy unless problems.    Essential hypertension, benign Assessment & Plan: Blood pressure as outlined.  Continue 240mg  cardizem . Continues losartan  and triam/hydrochlorothiazide  1/2 tablet per day. Follow pressures. No change in medication.    Stage 3a chronic kidney disease (HCC) Assessment & Plan: Stay hydrated. Avoid antiinflammatories.   Continue losartan . Recent GFR 52. Follow metabolic panel.    Allergic reaction, subsequent encounter Assessment & Plan: Continued recurring rash/hives as outlined. Referral to allergist as outlined.   Orders: -     Ambulatory referral to Allergy  Other orders -     Losartan  Potassium; Take 1 tablet (100 mg total) by mouth daily.  Dispense: 90 tablet; Refill: 3 -     Pantoprazole  Sodium; Take 1 tablet (40 mg total) by mouth daily as needed.  Dispense: 90 tablet; Refill: 1 -     Triamterene -HCTZ; Take 1/2 tablet by mouth daily.  Dispense: 45 tablet; Refill: 1     Allena Hamilton, MD "

## 2024-11-10 ENCOUNTER — Encounter: Payer: Self-pay | Admitting: Internal Medicine

## 2024-11-10 NOTE — Assessment & Plan Note (Signed)
 Continue lipitor.  Low cholesterol diet and exercise.  Follow lipid panel.  Lab Results  Component Value Date   CHOL 172 11/05/2024   HDL 53.70 11/05/2024   LDLCALC 99 11/05/2024   TRIG 95.0 11/05/2024   CHOLHDL 3 11/05/2024

## 2024-11-10 NOTE — Assessment & Plan Note (Signed)
 Continued recurring rash/hives as outlined. Referral to allergist as outlined.

## 2024-11-10 NOTE — Assessment & Plan Note (Signed)
 Low carb diet and exercise. Follow met b and A1c.  Lab Results  Component Value Date   HGBA1C 6.5 11/05/2024

## 2024-11-10 NOTE — Assessment & Plan Note (Signed)
 Recurring rash/hives as outlined. Recent ER evaluation. Request referral to allergist for further evaluation - to see if can determine etiology of recurring issues.

## 2024-11-10 NOTE — Assessment & Plan Note (Signed)
 Off synthroid now. Followed by endocrinology. Follow tsh.

## 2024-11-10 NOTE — Assessment & Plan Note (Signed)
 Stay hydrated. Avoid antiinflammatories.  Continue losartan . Recent GFR 52. Follow metabolic panel.

## 2024-11-10 NOTE — Assessment & Plan Note (Signed)
Had colonoscopy 07/2017.  Recommended f/u in 5 years.  She discussed with GI.  Recommended no further f/u colonoscopy unless problems.  

## 2024-11-10 NOTE — Assessment & Plan Note (Signed)
 Blood pressure as outlined.  Continue 240mg  cardizem . Continues losartan  and triam/hydrochlorothiazide  1/2 tablet per day. Follow pressures. No change in medication.

## 2025-03-10 ENCOUNTER — Ambulatory Visit: Admitting: Internal Medicine

## 2025-06-16 ENCOUNTER — Ambulatory Visit
